# Patient Record
Sex: Male | Born: 1955
Health system: Southern US, Community
[De-identification: ages and names within clinical notes are randomized; demographics above are authoritative.]

## PROBLEM LIST (undated history)

## (undated) DIAGNOSIS — J45909 Unspecified asthma, uncomplicated: Secondary | ICD-10-CM

## (undated) DIAGNOSIS — I10 Essential (primary) hypertension: Secondary | ICD-10-CM

## (undated) DIAGNOSIS — M1990 Unspecified osteoarthritis, unspecified site: Secondary | ICD-10-CM

## (undated) HISTORY — PX: TRACHEOSTOMY: SUR1362

## (undated) HISTORY — DX: Unspecified asthma, uncomplicated: J45.909

## (undated) HISTORY — DX: Essential (primary) hypertension: I10

## (undated) HISTORY — PX: JOINT REPLACEMENT: SHX530

---

## 2011-01-04 ENCOUNTER — Ambulatory Visit: Payer: Self-pay | Admitting: Family Medicine

## 2011-09-10 DIAGNOSIS — Z96649 Presence of unspecified artificial hip joint: Secondary | ICD-10-CM | POA: Insufficient documentation

## 2012-03-16 ENCOUNTER — Ambulatory Visit: Payer: Self-pay | Admitting: Gastroenterology

## 2012-03-19 LAB — HM COLONOSCOPY

## 2014-09-28 LAB — PSA

## 2015-04-04 ENCOUNTER — Encounter: Payer: Self-pay | Admitting: Unknown Physician Specialty

## 2015-04-04 ENCOUNTER — Ambulatory Visit (INDEPENDENT_AMBULATORY_CARE_PROVIDER_SITE_OTHER): Payer: 59 | Admitting: Unknown Physician Specialty

## 2015-04-04 VITALS — BP 126/85 | HR 76 | Temp 97.8°F | Ht 67.3 in | Wt 176.6 lb

## 2015-04-04 DIAGNOSIS — I10 Essential (primary) hypertension: Secondary | ICD-10-CM | POA: Diagnosis not present

## 2015-04-04 DIAGNOSIS — J4540 Moderate persistent asthma, uncomplicated: Secondary | ICD-10-CM

## 2015-04-04 DIAGNOSIS — J45909 Unspecified asthma, uncomplicated: Secondary | ICD-10-CM

## 2015-04-04 MED ORDER — VENTOLIN HFA 108 (90 BASE) MCG/ACT IN AERS: 2.0000 | INHALATION_SPRAY | Freq: Four times a day (QID) | RESPIRATORY_TRACT | Status: DC | PRN

## 2015-04-04 MED ORDER — LISINOPRIL 10 MG PO TABS: 10.0000 mg | ORAL_TABLET | Freq: Every day | ORAL | Status: DC

## 2015-04-04 MED ORDER — FLUTICASONE-SALMETEROL 250-50 MCG/DOSE IN AEPB: 1.0000 | INHALATION_SPRAY | Freq: Two times a day (BID) | RESPIRATORY_TRACT | Status: DC

## 2015-04-04 NOTE — Assessment & Plan Note (Signed)
Stable BP on Lisinopril.

## 2015-04-04 NOTE — Progress Notes (Signed)
   BP 126/85 mmHg  Pulse 76  Temp(Src) 97.8 F (36.6 C)  Ht 5' 7.3" (1.709 m)  Wt 176 lb 9.6 oz (80.105 kg)  BMI 27.43 kg/m2  SpO2 93%   Subjective:    Patient ID: David Quinn, male    DOB: 1956-01-02, 59 y.o.   MRN: [id number]  HPI: David Quinn is a 59 y.o. male  Chief Complaint  Patient presents with  . Hypertension    Hypertension: Patient denies headaches, chest pain or shortness of breath. He does not monitor BP at home. Compliant on Lisinopril.  Asthma: Reports increased use of Ventolin Inhaler, at least two times daily. Advair is take two times daily. Denies shortness of breath or recent asthma attacks. Reports chest tightness with relief from Ventolin Inhaler.   Relevant past medical, surgical, family and social history reviewed and updated as indicated. Interim medical history since our last visit reviewed. Allergies and medications reviewed and updated.  Review of Systems  Constitutional: Negative.   Respiratory: Negative.  Negative for cough, chest tightness, shortness of breath, wheezing and stridor.   Cardiovascular: Negative.  Negative for chest pain, palpitations and leg swelling.  Skin: Negative.  Negative for color change and pallor.  Neurological: Negative.  Negative for dizziness, light-headedness and headaches.    Per HPI unless specifically indicated above     Objective:    BP 126/85 mmHg  Pulse 76  Temp(Src) 97.8 F (36.6 C)  Ht 5' 7.3" (1.709 m)  Wt 176 lb 9.6 oz (80.105 kg)  BMI 27.43 kg/m2  SpO2 93%  Wt Readings from Last 3 Encounters:  04/04/15 176 lb 9.6 oz (80.105 kg)  09/28/14 173 lb (78.472 kg)    Physical Exam  Constitutional: He is oriented to person, place, and time. He appears well-developed and well-nourished. No distress.  HENT:  Head: Normocephalic.  Cardiovascular: Normal rate, regular rhythm and normal heart sounds.   Pulmonary/Chest: Effort normal and breath sounds normal. No respiratory distress. He has no  wheezes. He has no rales. He exhibits no tenderness.  Neurological: He is alert and oriented to person, place, and time.  Skin: Skin is warm and dry. He is not diaphoretic. No erythema. No pallor.      Assessment & Plan:   Problem List Items Addressed This Visit      Unprioritized   Hypertension    Stable BP on Lisinopril.      Relevant Medications   lisinopril (PRINIVIL,ZESTRIL) 10 MG tablet   Asthma, moderate persistent    Compliant with Advir and Ventolin. Increased ventolin use.      Relevant Medications   VENTOLIN HFA 108 (90 BASE) MCG/ACT inhaler   Fluticasone-Salmeterol (ADVAIR DISKUS) 250-50 MCG/DOSE AEPB    Other Visit Diagnoses    Moderate persistent asthma, uncomplicated    -  Primary    Relevant Medications    VENTOLIN HFA 108 (90 BASE) MCG/ACT inhaler    Fluticasone-Salmeterol (ADVAIR DISKUS) 250-50 MCG/DOSE AEPB       Increased Advair 250-50 secondary to increased Ventolin use.  Follow up plan: Return in about 6 months (around 10/05/2015) for physical.

## 2015-04-04 NOTE — Assessment & Plan Note (Signed)
Compliant with Advir and Ventolin. Increased ventolin use.

## 2015-10-06 ENCOUNTER — Encounter: Payer: Self-pay | Admitting: Unknown Physician Specialty

## 2015-10-06 ENCOUNTER — Ambulatory Visit (INDEPENDENT_AMBULATORY_CARE_PROVIDER_SITE_OTHER): Payer: 59 | Admitting: Unknown Physician Specialty

## 2015-10-06 VITALS — BP 119/79 | HR 96 | Temp 97.7°F | Ht 66.0 in | Wt 169.6 lb

## 2015-10-06 DIAGNOSIS — I10 Essential (primary) hypertension: Secondary | ICD-10-CM

## 2015-10-06 DIAGNOSIS — J4540 Moderate persistent asthma, uncomplicated: Secondary | ICD-10-CM

## 2015-10-06 DIAGNOSIS — Z0000 Encounter for general adult medical examination without abnormal findings: Secondary | ICD-10-CM

## 2015-10-06 LAB — MICROALBUMIN, URINE WAIVED
CREATININE, URINE WAIVED: 200 mg/dL (ref 10–300)
Microalb, Ur Waived: 30 mg/L — ABNORMAL HIGH (ref 0–19)

## 2015-10-06 NOTE — Progress Notes (Signed)
BP 119/79 mmHg  Pulse 96  Temp(Src) 97.7 F (36.5 C)  Ht  (1.676 m)  Wt 169 lb 9.6 oz (76.93 kg)  BMI 27.39 kg/m2  SpO2 94%   Subjective:    Patient ID: David Quinn, male    DOB: 05/25/1956, 60 y.o.   MRN: [id number]  HPI: David Quinn is a 60 y.o. male  Chief Complaint  Patient presents with  . Annual Exam   Hypertension Using medications without difficulty Average home BPsNot checking  No problems or lightheadedness No chest pain with exertion or shortness of breath No Edema  ASTHMA Triggers:change in weather Symptoms are well controlled: Using medications without problems: Night time symptoms: none Wheeze/SOB: rare ER visits since last visit:none Missed work or school: no    Relevant past medical, surgical, family and social history reviewed and updated as indicated. Interim medical history since our last visit reviewed. Allergies and medications reviewed and updated.  Review of Systems  Per HPI unless specifically indicated above     Objective:    BP 119/79 mmHg  Pulse 96  Temp(Src) 97.7 F (36.5 C)  Ht  (1.676 m)  Wt 169 lb 9.6 oz (76.93 kg)  BMI 27.39 kg/m2  SpO2 94%  Wt Readings from Last 3 Encounters:  10/06/15 169 lb 9.6 oz (76.93 kg)  04/04/15 176 lb 9.6 oz (80.105 kg)  09/28/14 173 lb (78.472 kg)    Physical Exam  Constitutional: He is oriented to person, place, and time. He appears well-developed and well-nourished.  HENT:  Head: Normocephalic.  Right Ear: Tympanic membrane, external ear and ear canal normal.  Left Ear: Tympanic membrane, external ear and ear canal normal.  Mouth/Throat: Uvula is midline, oropharynx is clear and moist and mucous membranes are normal.  Eyes: Pupils are equal, round, and reactive to light.  Cardiovascular: Normal rate, regular rhythm and normal heart sounds.  Exam reveals no gallop and no friction rub.   No murmur heard. Pulmonary/Chest: Effort normal and breath sounds normal.  No respiratory distress.  Abdominal: Soft. Bowel sounds are normal. He exhibits no distension. There is no tenderness.  Musculoskeletal: Normal range of motion.  Neurological: He is alert and oriented to person, place, and time. He has normal reflexes.  Skin: Skin is warm and dry.  Psychiatric: He has a normal mood and affect. His behavior is normal. Judgment and thought content normal.    Results for orders placed or performed in visit on 04/04/15  PSA  Result Value Ref Range   PSA from PP   HM COLONOSCOPY  Result Value Ref Range   HM Colonoscopy from [facility]       Assessment & Plan:   Problem List Items Addressed This Visit      Unprioritized   Hypertension    Stable, continue present medications.        Relevant Orders   Comprehensive metabolic panel   Lipid Panel w/o Chol/HDL Ratio   Uric acid   Microalbumin, Urine Waived   Asthma, moderate persistent    Other Visit Diagnoses    Routine general medical examination at a health care facility    -  Primary    Relevant Orders    Hepatitis C antibody    HIV antibody    TSH    PSA    Lipid Panel w/o Chol/HDL Ratio    CBC with Differential/Platelet        Follow up plan: Return in about 6 months (around  04/04/2016).       

## 2015-10-06 NOTE — Assessment & Plan Note (Signed)
Stable, continue present medications.   

## 2015-10-07 LAB — CBC WITH DIFFERENTIAL/PLATELET
Basophils Absolute: 0 10*3/uL (ref 0.0–0.2)
Basos: 1 %
EOS (ABSOLUTE): 0.6 10*3/uL — AB (ref 0.0–0.4)
EOS: 15 %
Hematocrit: 44.8 % (ref 37.5–51.0)
Hemoglobin: 14.4 g/dL (ref 12.6–17.7)
IMMATURE GRANULOCYTES: 0 %
Immature Grans (Abs): 0 10*3/uL (ref 0.0–0.1)
LYMPHS: 30 %
Lymphocytes Absolute: 1.2 10*3/uL (ref 0.7–3.1)
MCH: 26.6 pg (ref 26.6–33.0)
MCHC: 32.1 g/dL (ref 31.5–35.7)
MCV: 83 fL (ref 79–97)
MONOS ABS: 0.5 10*3/uL (ref 0.1–0.9)
Monocytes: 13 %
NEUTROS PCT: 41 %
Neutrophils Absolute: 1.7 10*3/uL (ref 1.4–7.0)
PLATELETS: 273 10*3/uL (ref 150–379)
RBC: 5.42 x10E6/uL (ref 4.14–5.80)
RDW: 13.8 % (ref 12.3–15.4)
WBC: 4 10*3/uL (ref 3.4–10.8)

## 2015-10-07 LAB — COMPREHENSIVE METABOLIC PANEL
ALBUMIN: 4 g/dL (ref 3.5–5.5)
ALK PHOS: 80 IU/L (ref 39–117)
ALT: 37 IU/L (ref 0–44)
AST: 34 IU/L (ref 0–40)
Albumin/Globulin Ratio: 1.3 (ref 1.1–2.5)
BILIRUBIN TOTAL: 0.3 mg/dL (ref 0.0–1.2)
BUN / CREAT RATIO: 18 (ref 9–20)
BUN: 19 mg/dL (ref 6–24)
CHLORIDE: 101 mmol/L (ref 96–106)
CO2: 23 mmol/L (ref 18–29)
Calcium: 9.2 mg/dL (ref 8.7–10.2)
Creatinine, Ser: 1.03 mg/dL (ref 0.76–1.27)
GFR calc Af Amer: 91 mL/min/{1.73_m2} (ref >59)
GFR calc non Af Amer: 79 mL/min/{1.73_m2} (ref >59)
Globulin, Total: 3 g/dL (ref 1.5–4.5)
Glucose: 86 mg/dL (ref 65–99)
Potassium: 4.5 mmol/L (ref 3.5–5.2)
SODIUM: 140 mmol/L (ref 134–144)
Total Protein: 7 g/dL (ref 6.0–8.5)

## 2015-10-07 LAB — TSH: TSH: 1.9 u[IU]/mL (ref 0.450–4.500)

## 2015-10-07 LAB — LIPID PANEL W/O CHOL/HDL RATIO
Cholesterol, Total: 227 mg/dL — ABNORMAL HIGH (ref 100–199)
HDL: 63 mg/dL (ref >39)
LDL Calculated: 150 mg/dL — ABNORMAL HIGH (ref 0–99)
Triglycerides: 72 mg/dL (ref 0–149)
VLDL CHOLESTEROL CAL: 14 mg/dL (ref 5–40)

## 2015-10-07 LAB — HEPATITIS C ANTIBODY

## 2015-10-07 LAB — PSA: Prostate Specific Ag, Serum: 1.8 ng/mL (ref 0.0–4.0)

## 2015-10-07 LAB — HIV ANTIBODY (ROUTINE TESTING W REFLEX): HIV Screen 4th Generation wRfx: NONREACTIVE

## 2015-10-07 LAB — URIC ACID: Uric Acid: 7.2 mg/dL (ref 3.7–8.6)

## 2015-10-09 ENCOUNTER — Encounter: Payer: Self-pay | Admitting: Unknown Physician Specialty

## 2015-11-24 ENCOUNTER — Encounter: Payer: Self-pay | Admitting: Unknown Physician Specialty

## 2015-11-24 ENCOUNTER — Ambulatory Visit (INDEPENDENT_AMBULATORY_CARE_PROVIDER_SITE_OTHER): Payer: 59 | Admitting: Unknown Physician Specialty

## 2015-11-24 VITALS — BP 138/86 | HR 96 | Temp 98.3°F | Ht 65.2 in | Wt 165.8 lb

## 2015-11-24 DIAGNOSIS — K047 Periapical abscess without sinus: Secondary | ICD-10-CM | POA: Diagnosis not present

## 2015-11-24 MED ORDER — AMOXICILLIN-POT CLAVULANATE 875-125 MG PO TABS: 1.0000 | ORAL_TABLET | Freq: Two times a day (BID) | ORAL | Status: DC

## 2015-11-24 NOTE — Progress Notes (Signed)
BP 138/86 mmHg  Pulse 96  Temp(Src) 98.3 F (36.8 C)  Ht 5' 5.2" (1.656 m)  Wt 165 lb 12.8 oz (75.206 kg)  BMI 27.42 kg/m2  SpO2 98%   Subjective:    Patient ID: David Quinn, male    DOB: [birth year], 60 y.o.   MRN: [id number]  HPI: David Quinn is a 60 y.o. male  Chief Complaint  Patient presents with  . Facial Swelling    pt states he has had some swelling on his face in left cheek area since Saturday. States he thinks it is a wisdom tooth and has been putting ice on it.    Pt with left facial swelling.  It is better and no longer painful.  States he doesn't eat on that side as he thinks he has an impacted wisdom tooth.    Relevant past medical, surgical, family and social history reviewed and updated as indicated. Interim medical history since our last visit reviewed. Allergies and medications reviewed and updated.  Review of Systems  Per HPI unless specifically indicated above     Objective:    BP 138/86 mmHg  Pulse 96  Temp(Src) 98.3 F (36.8 C)  Ht 5' 5.2" (1.656 m)  Wt 165 lb 12.8 oz (75.206 kg)  BMI 27.42 kg/m2  SpO2 98%  Wt Readings from Last 3 Encounters:  11/24/15 165 lb 12.8 oz (75.206 kg)  10/06/15 169 lb 9.6 oz (76.93 kg)  04/04/15 176 lb 9.6 oz (80.105 kg)    Physical Exam  Constitutional: He is oriented to person, place, and time. He appears well-developed and well-nourished. No distress.  HENT:  Head: Normocephalic and atraumatic.  Poor dentitions and gum swelling  Eyes: Conjunctivae and lids are normal. Right eye exhibits no discharge. Left eye exhibits no discharge. No scleral icterus.  Cardiovascular: Normal rate.   Pulmonary/Chest: Effort normal.  Abdominal: Normal appearance. There is no splenomegaly or hepatomegaly.  Musculoskeletal: Normal range of motion.  Neurological: He is alert and oriented to person, place, and time.  Skin: Skin is intact. No rash noted. No pallor.  Psychiatric: He has a normal mood and affect. His  behavior is normal. Judgment and thought content normal.    Results for orders placed or performed in visit on 10/06/15  Comprehensive metabolic panel  Result Value Ref Range   Glucose 86 65 - 99 mg/dL   BUN 19 6 - 24 mg/dL   Creatinine, Ser 7.82 0.76 - 1.27 mg/dL   GFR calc non Af Amer 79 >59 mL/min/1.73   GFR calc Af Amer 91 >59 mL/min/1.73   BUN/Creatinine Ratio 18 9 - 20   Sodium 140 134 - 144 mmol/L   Potassium 4.5 3.5 - 5.2 mmol/L   Chloride 101 96 - 106 mmol/L   CO2 23 18 - 29 mmol/L   Calcium 9.2 8.7 - 10.2 mg/dL   Total Protein 7.0 6.0 - 8.5 g/dL   Albumin 4.0 3.5 - 5.5 g/dL   Globulin, Total 3.0 1.5 - 4.5 g/dL   Albumin/Globulin Ratio 1.3 1.1 - 2.5   Bilirubin Total 0.3 0.0 - 1.2 mg/dL   Alkaline Phosphatase 80 39 - 117 IU/L   AST 34 0 - 40 IU/L   ALT 37 0 - 44 IU/L  Hepatitis C antibody  Result Value Ref Range   Hep C Virus Ab <0.1 0.0 - 0.9 s/co ratio  HIV antibody  Result Value Ref Range   HIV Screen 4th Generation wRfx Non Reactive Non  Reactive  TSH  Result Value Ref Range   TSH 1.900 0.450 - 4.500 uIU/mL  PSA  Result Value Ref Range   Prostate Specific Ag, Serum 1.8 0.0 - 4.0 ng/mL  Lipid Panel w/o Chol/HDL Ratio  Result Value Ref Range   Cholesterol, Total 227 (H) 100 - 199 mg/dL   Triglycerides 72 0 - 149 mg/dL   HDL 63 >16>39 mg/dL   VLDL Cholesterol Cal 14 5 - 40 mg/dL   LDL Calculated 109150 (H) 0 - 99 mg/dL  CBC with Differential/Platelet  Result Value Ref Range   WBC 4.0 3.4 - 10.8 x10E3/uL   RBC 5.42 4.14 - 5.80 x10E6/uL   Hemoglobin 14.4 12.6 - 17.7 g/dL   Hematocrit 60.444.8 54.037.5 - 51.0 %   MCV 83 79 - 97 fL   MCH 26.6 26.6 - 33.0 pg   MCHC 32.1 31.5 - 35.7 g/dL   RDW 98.113.8 19.112.3 - 47.815.4 %   Platelets 273 150 - 379 x10E3/uL   Neutrophils 41 %   Lymphs 30 %   Monocytes 13 %   Eos 15 %   Basos 1 %   Neutrophils Absolute 1.7 1.4 - 7.0 x10E3/uL   Lymphocytes Absolute 1.2 0.7 - 3.1 x10E3/uL   Monocytes Absolute 0.5 0.1 - 0.9 x10E3/uL   EOS  (ABSOLUTE) 0.6 (H) 0.0 - 0.4 x10E3/uL   Basophils Absolute 0.0 0.0 - 0.2 x10E3/uL   Immature Granulocytes 0 %   Immature Grans (Abs) 0.0 0.0 - 0.1 x10E3/uL  Uric acid  Result Value Ref Range   Uric Acid 7.2 3.7 - 8.6 mg/dL  Microalbumin, Urine Waived  Result Value Ref Range   Microalb, Ur Waived 30 (H) 0 - 19 mg/L   Creatinine, Urine Waived 200 10 - 300 mg/dL   Microalb/Creat Ratio <30 <30 mg/g      Assessment & Plan:   Problem List Items Addressed This Visit    None    Visit Diagnoses    Dental abscess    -  Primary        Follow up plan: Return for with dentist.

## 2015-11-24 NOTE — Patient Instructions (Signed)
David RamsKaren Quinn [phone]

## 2016-01-23 ENCOUNTER — Other Ambulatory Visit: Payer: Self-pay | Admitting: Unknown Physician Specialty

## 2016-02-06 ENCOUNTER — Other Ambulatory Visit: Payer: Self-pay | Admitting: Unknown Physician Specialty

## 2016-04-05 ENCOUNTER — Ambulatory Visit: Payer: 59 | Admitting: Unknown Physician Specialty

## 2016-04-09 ENCOUNTER — Other Ambulatory Visit: Payer: Self-pay | Admitting: Unknown Physician Specialty

## 2016-04-12 ENCOUNTER — Ambulatory Visit: Payer: 59 | Admitting: Unknown Physician Specialty

## 2016-04-17 ENCOUNTER — Ambulatory Visit: Payer: 59 | Admitting: Unknown Physician Specialty

## 2016-04-23 ENCOUNTER — Ambulatory Visit (INDEPENDENT_AMBULATORY_CARE_PROVIDER_SITE_OTHER): Payer: 59 | Admitting: Unknown Physician Specialty

## 2016-04-23 ENCOUNTER — Encounter: Payer: Self-pay | Admitting: Unknown Physician Specialty

## 2016-04-23 DIAGNOSIS — I10 Essential (primary) hypertension: Secondary | ICD-10-CM

## 2016-04-23 DIAGNOSIS — J4540 Moderate persistent asthma, uncomplicated: Secondary | ICD-10-CM

## 2016-04-23 DIAGNOSIS — E785 Hyperlipidemia, unspecified: Secondary | ICD-10-CM | POA: Diagnosis not present

## 2016-04-23 NOTE — Assessment & Plan Note (Signed)
Reviewed ASCVD calculator.  Statin recommended.  Pt refused

## 2016-04-23 NOTE — Patient Instructions (Addendum)

## 2016-04-23 NOTE — Assessment & Plan Note (Signed)
Stable, continue present medications.   

## 2016-04-23 NOTE — Progress Notes (Signed)
BP 135/89 (BP Location: Left Arm, Patient Position: Sitting, Cuff Size: Large)   Pulse 68   Temp 98 F (36.7 C)   Ht 5\' 7"  (1.702 m)   Wt 174 lb (78.9 kg)   SpO2 98%   BMI 27.25 kg/m    Subjective:    Patient ID: David Quinn, male    DOB: 11/07/1955, 60 y.o.   MRN: [id number]  HPI: David MessierDaniel L Mcpeters is a 60 y.o. male  Chief Complaint  Patient presents with  . Asthma  . Hypertension   Hypertension Using medications without difficulty Average home BPsNot checking                   No problems or lightheadedness No chest pain with exertion or shortness of breath No Edema  ASTHMA Triggers:change in weather Symptoms are well controlled: Using medications without problems: Night time symptoms: none Wheeze/SOB: rare ER visits since last visit:none Missed work or school: no  Relevant past medical, surgical, family and social history reviewed and updated as indicated. Interim medical history since our last visit reviewed. Allergies and medications reviewed and updated.  Review of Systems  Per HPI unless specifically indicated above     Objective:    BP 135/89 (BP Location: Left Arm, Patient Position: Sitting, Cuff Size: Large)   Pulse 68   Temp 98 F (36.7 C)   Ht 5\' 7"  (1.702 m)   Wt 174 lb (78.9 kg)   SpO2 98%   BMI 27.25 kg/m   Wt Readings from Last 3 Encounters:  04/23/16 174 lb (78.9 kg)  11/24/15 165 lb 12.8 oz (75.2 kg)  10/06/15 169 lb 9.6 oz (76.9 kg)    Physical Exam  Constitutional: He is oriented to person, place, and time. He appears well-developed and well-nourished. No distress.  HENT:  Head: Normocephalic and atraumatic.  Eyes: Conjunctivae and lids are normal. Right eye exhibits no discharge. Left eye exhibits no discharge. No scleral icterus.  Neck: Normal range of motion. Neck supple. No JVD present. Carotid bruit is not present.  Cardiovascular: Normal rate, regular rhythm and normal heart sounds.   Pulmonary/Chest: Effort  normal and breath sounds normal. No respiratory distress.  Abdominal: Normal appearance. There is no splenomegaly or hepatomegaly.  Musculoskeletal: Normal range of motion.  Neurological: He is alert and oriented to person, place, and time.  Skin: Skin is warm, dry and intact. No rash noted. No pallor.  Psychiatric: He has a normal mood and affect. His behavior is normal. Judgment and thought content normal.    Results for orders placed or performed in visit on 10/06/15  Comprehensive metabolic panel  Result Value Ref Range   Glucose 86 65 - 99 mg/dL   BUN 19 6 - 24 mg/dL   Creatinine, Ser 4.091.03 0.76 - 1.27 mg/dL   GFR calc non Af Amer 79 >59 mL/min/1.73   GFR calc Af Amer 91 >59 mL/min/1.73   BUN/Creatinine Ratio 18 9 - 20   Sodium 140 134 - 144 mmol/L   Potassium 4.5 3.5 - 5.2 mmol/L   Chloride 101 96 - 106 mmol/L   CO2 23 18 - 29 mmol/L   Calcium 9.2 8.7 - 10.2 mg/dL   Total Protein 7.0 6.0 - 8.5 g/dL   Albumin 4.0 3.5 - 5.5 g/dL   Globulin, Total 3.0 1.5 - 4.5 g/dL   Albumin/Globulin Ratio 1.3 1.1 - 2.5   Bilirubin Total 0.3 0.0 - 1.2 mg/dL   Alkaline Phosphatase 80 39 -  117 IU/L   AST 34 0 - 40 IU/L   ALT 37 0 - 44 IU/L  Hepatitis C antibody  Result Value Ref Range   Hep C Virus Ab <0.1 0.0 - 0.9 s/co ratio  HIV antibody  Result Value Ref Range   HIV Screen 4th Generation wRfx Non Reactive Non Reactive  TSH  Result Value Ref Range   TSH 1.900 0.450 - 4.500 uIU/mL  PSA  Result Value Ref Range   Prostate Specific Ag, Serum 1.8 0.0 - 4.0 ng/mL  Lipid Panel w/o Chol/HDL Ratio  Result Value Ref Range   Cholesterol, Total 227 (H) 100 - 199 mg/dL   Triglycerides 72 0 - 149 mg/dL   HDL 63 >16 mg/dL   VLDL Cholesterol Cal 14 5 - 40 mg/dL   LDL Calculated 109 (H) 0 - 99 mg/dL  CBC with Differential/Platelet  Result Value Ref Range   WBC 4.0 3.4 - 10.8 x10E3/uL   RBC 5.42 4.14 - 5.80 x10E6/uL   Hemoglobin 14.4 12.6 - 17.7 g/dL   Hematocrit 60.4 54.0 - 51.0 %   MCV 83  79 - 97 fL   MCH 26.6 26.6 - 33.0 pg   MCHC 32.1 31.5 - 35.7 g/dL   RDW 98.1 19.1 - 47.8 %   Platelets 273 150 - 379 x10E3/uL   Neutrophils 41 %   Lymphs 30 %   Monocytes 13 %   Eos 15 %   Basos 1 %   Neutrophils Absolute 1.7 1.4 - 7.0 x10E3/uL   Lymphocytes Absolute 1.2 0.7 - 3.1 x10E3/uL   Monocytes Absolute 0.5 0.1 - 0.9 x10E3/uL   EOS (ABSOLUTE) 0.6 (H) 0.0 - 0.4 x10E3/uL   Basophils Absolute 0.0 0.0 - 0.2 x10E3/uL   Immature Granulocytes 0 %   Immature Grans (Abs) 0.0 0.0 - 0.1 x10E3/uL  Uric acid  Result Value Ref Range   Uric Acid 7.2 3.7 - 8.6 mg/dL  Microalbumin, Urine Waived  Result Value Ref Range   Microalb, Ur Waived 30 (H) 0 - 19 mg/L   Creatinine, Urine Waived 200 10 - 300 mg/dL   Microalb/Creat Ratio <30 <30 mg/g      Assessment & Plan:   Problem List Items Addressed This Visit      Unprioritized   Asthma, moderate persistent    Stable, continue present medications.         Hyperlipidemia    Reviewed ASCVD calculator.  Statin recommended.  Pt refused      Hypertension    Stable, continue present medications.         Other Visit Diagnoses   None.      Follow up plan: Return in about 6 months (around 10/23/2016) for physical.

## 2016-10-23 ENCOUNTER — Ambulatory Visit (INDEPENDENT_AMBULATORY_CARE_PROVIDER_SITE_OTHER): Payer: 59 | Admitting: Unknown Physician Specialty

## 2016-10-23 ENCOUNTER — Encounter: Payer: Self-pay | Admitting: Unknown Physician Specialty

## 2016-10-23 VITALS — BP 137/83 | HR 85 | Temp 98.4°F | Ht 65.6 in | Wt 170.1 lb

## 2016-10-23 DIAGNOSIS — J4540 Moderate persistent asthma, uncomplicated: Secondary | ICD-10-CM

## 2016-10-23 DIAGNOSIS — I10 Essential (primary) hypertension: Secondary | ICD-10-CM | POA: Diagnosis not present

## 2016-10-23 DIAGNOSIS — Z0000 Encounter for general adult medical examination without abnormal findings: Secondary | ICD-10-CM

## 2016-10-23 MED ORDER — LISINOPRIL 10 MG PO TABS: 10.0000 mg | ORAL_TABLET | Freq: Every day | ORAL | 2 refills | Status: DC

## 2016-10-23 MED ORDER — FLUTICASONE-SALMETEROL 250-50 MCG/DOSE IN AEPB: 2.0000 | INHALATION_SPRAY | Freq: Every day | RESPIRATORY_TRACT | 12 refills | Status: DC

## 2016-10-23 NOTE — Assessment & Plan Note (Signed)
Stable, continue present medications.   

## 2016-10-23 NOTE — Progress Notes (Signed)
BP 137/83 (BP Location: Left Arm, Patient Position: Sitting, Cuff Size: Large)   Pulse 85   Temp 98.4 F (36.9 C)   Ht 5' 5.6" (1.666 m)   Wt 170 lb 1.6 oz (77.2 kg)   SpO2 93%   BMI 27.79 kg/m    Subjective:    Patient ID: David Quinn, male    DOB: 05/11/1956, [age] y.o.   MRN: [id number]  HPI: David Quinn is a 61 y.o. male  Chief Complaint  Patient presents with  . Annual Exam   Hypertension Using medications without difficulty Average home BPs   No problems or lightheadedness No chest pain with exertion or shortness of breath No Edema  ASTHMA Symptoms are well controlled Using medications without problems Night time symptoms: none Wheeze/SOB:none ER visits since last visit:none Missed work or school:none  Social History   Social History  . Marital status: Married    Spouse name: N/A  . Number of children: N/A  . Years of education: N/A   Occupational History  . Not on file.   Social History Main Topics  . Smoking status: Never Smoker  . Smokeless tobacco: Never Used  . Alcohol use No  . Drug use: No  . Sexual activity: Yes   Other Topics Concern  . Not on file   Social History Narrative  . No narrative on file   Family History  Problem Relation Age of Onset  . Asthma Mother   . Cancer Mother   . Hyperlipidemia Mother   . Hypertension Mother   . Hypertension Father   . Stroke Father   . Cancer Brother     stomach  . Seizures Brother    Past Medical History:  Diagnosis Date  . Asthma   . Hypertension    Past Surgical History:  Procedure Laterality Date  . JOINT REPLACEMENT Bilateral    hip replacement  . TRACHEOSTOMY      Relevant past medical, surgical, family and social history reviewed and updated as indicated. Interim medical history since our last visit reviewed. Allergies and medications reviewed and updated.  Review of Systems  Per HPI unless specifically indicated above     Objective:    BP 137/83 (BP  Location: Left Arm, Patient Position: Sitting, Cuff Size: Large)   Pulse 85   Temp 98.4 F (36.9 C)   Ht 5' 5.6" (1.666 m)   Wt 170 lb 1.6 oz (77.2 kg)   SpO2 93%   BMI 27.79 kg/m   Wt Readings from Last 3 Encounters:  10/23/16 170 lb 1.6 oz (77.2 kg)  04/23/16 174 lb (78.9 kg)  11/24/15 165 lb 12.8 oz (75.2 kg)    Physical Exam  Constitutional: He is oriented to person, place, and time. He appears well-developed and well-nourished.  HENT:  Head: Normocephalic.  Right Ear: Tympanic membrane, external ear and ear canal normal.  Left Ear: Tympanic membrane, external ear and ear canal normal.  Mouth/Throat: Uvula is midline, oropharynx is clear and moist and mucous membranes are normal.  Eyes: Pupils are equal, round, and reactive to light.  Cardiovascular: Normal rate, regular rhythm and normal heart sounds.  Exam reveals no gallop and no friction rub.   No murmur heard. Pulmonary/Chest: Effort normal and breath sounds normal. No respiratory distress.  Abdominal: Soft. Bowel sounds are normal. He exhibits no distension. There is no tenderness.  Musculoskeletal: Normal range of motion.  Neurological: He is alert and oriented to person, place, and time. He  has normal reflexes.  Skin: Skin is warm and dry.  Psychiatric: He has a normal mood and affect. His behavior is normal. Judgment and thought content normal.  n,,,, Assessment & Plan:   Problem List Items Addressed This Visit      Unprioritized   Asthma, moderate persistent    Stable, continue present medications.        Relevant Medications   Fluticasone-Salmeterol (ADVAIR DISKUS) 250-50 MCG/DOSE AEPB   Hypertension    Stable, continue present medications.        Relevant Medications   lisinopril (PRINIVIL,ZESTRIL) 10 MG tablet   Other Relevant Orders   Comprehensive metabolic panel   Lipid Panel w/o Chol/HDL Ratio    Other Visit Diagnoses    Annual physical exam    -  Primary   Relevant Orders   CBC with  Differential/Platelet   Comprehensive metabolic panel   Lipid Panel w/o Chol/HDL Ratio   PSA   TSH       Follow up plan: Return in about 6 months (around 04/22/2017).

## 2016-10-24 LAB — PSA: Prostate Specific Ag, Serum: 2 ng/mL (ref 0.0–4.0)

## 2016-10-24 LAB — COMPREHENSIVE METABOLIC PANEL
ALBUMIN: 4.3 g/dL (ref 3.6–4.8)
ALK PHOS: 83 IU/L (ref 39–117)
ALT: 37 IU/L (ref 0–44)
AST: 36 IU/L (ref 0–40)
Albumin/Globulin Ratio: 1.3 (ref 1.2–2.2)
BILIRUBIN TOTAL: 0.4 mg/dL (ref 0.0–1.2)
BUN / CREAT RATIO: 18 (ref 10–24)
BUN: 19 mg/dL (ref 8–27)
CHLORIDE: 97 mmol/L (ref 96–106)
CO2: 22 mmol/L (ref 18–29)
Calcium: 9.5 mg/dL (ref 8.6–10.2)
Creatinine, Ser: 1.04 mg/dL (ref 0.76–1.27)
GFR calc non Af Amer: 78 mL/min/{1.73_m2} (ref >59)
GFR, EST AFRICAN AMERICAN: 90 mL/min/{1.73_m2} (ref >59)
GLUCOSE: 82 mg/dL (ref 65–99)
Globulin, Total: 3.2 g/dL (ref 1.5–4.5)
POTASSIUM: 4.9 mmol/L (ref 3.5–5.2)
Sodium: 136 mmol/L (ref 134–144)
TOTAL PROTEIN: 7.5 g/dL (ref 6.0–8.5)

## 2016-10-24 LAB — CBC WITH DIFFERENTIAL/PLATELET
BASOS ABS: 0 10*3/uL (ref 0.0–0.2)
Basos: 0 %
EOS (ABSOLUTE): 0.3 10*3/uL (ref 0.0–0.4)
Eos: 7 %
Hematocrit: 43.6 % (ref 37.5–51.0)
Hemoglobin: 14.4 g/dL (ref 13.0–17.7)
IMMATURE GRANS (ABS): 0 10*3/uL (ref 0.0–0.1)
Immature Granulocytes: 1 %
LYMPHS ABS: 1.3 10*3/uL (ref 0.7–3.1)
LYMPHS: 31 %
MCH: 26.6 pg (ref 26.6–33.0)
MCHC: 33 g/dL (ref 31.5–35.7)
MCV: 80 fL (ref 79–97)
Monocytes Absolute: 0.5 10*3/uL (ref 0.1–0.9)
Monocytes: 11 %
NEUTROS ABS: 2.2 10*3/uL (ref 1.4–7.0)
Neutrophils: 50 %
PLATELETS: 337 10*3/uL (ref 150–379)
RBC: 5.42 x10E6/uL (ref 4.14–5.80)
RDW: 14 % (ref 12.3–15.4)
WBC: 4.3 10*3/uL (ref 3.4–10.8)

## 2016-10-24 LAB — LIPID PANEL W/O CHOL/HDL RATIO
Cholesterol, Total: 242 mg/dL — ABNORMAL HIGH (ref 100–199)
HDL: 66 mg/dL (ref >39)
LDL Calculated: 164 mg/dL — ABNORMAL HIGH (ref 0–99)
Triglycerides: 62 mg/dL (ref 0–149)
VLDL Cholesterol Cal: 12 mg/dL (ref 5–40)

## 2016-10-24 LAB — TSH: TSH: 2.67 u[IU]/mL (ref 0.450–4.500)

## 2016-10-28 ENCOUNTER — Encounter: Payer: Self-pay | Admitting: Unknown Physician Specialty

## 2017-04-22 ENCOUNTER — Ambulatory Visit (INDEPENDENT_AMBULATORY_CARE_PROVIDER_SITE_OTHER): Payer: BLUE CROSS/BLUE SHIELD | Admitting: Unknown Physician Specialty

## 2017-04-22 ENCOUNTER — Encounter: Payer: Self-pay | Admitting: Unknown Physician Specialty

## 2017-04-22 DIAGNOSIS — I10 Essential (primary) hypertension: Secondary | ICD-10-CM

## 2017-04-22 DIAGNOSIS — J4540 Moderate persistent asthma, uncomplicated: Secondary | ICD-10-CM

## 2017-04-22 DIAGNOSIS — E7800 Pure hypercholesterolemia, unspecified: Secondary | ICD-10-CM | POA: Diagnosis not present

## 2017-04-22 MED ORDER — ASPIRIN 81 MG PO TABS: 81.0000 mg | ORAL_TABLET | Freq: Every day | ORAL | 12 refills | Status: DC

## 2017-04-22 NOTE — Assessment & Plan Note (Signed)
Stable, continue present medications.   

## 2017-04-22 NOTE — Progress Notes (Signed)
BP 125/85   Pulse 90   Temp 97.7 F (36.5 C)   Ht 5' 6.8" (1.697 m)   Wt 172 lb 9.6 oz (78.3 kg)   SpO2 98%   BMI 27.20 kg/m    Subjective:    Patient ID: David Quinn, male    DOB: 08-06-1956, 61 y.o.   MRN: [id number]  HPI: David Quinn is a 61 y.o. male  Chief Complaint  Patient presents with  . Asthma    6 month f/up  . Hypertension    6 month f/up    Hypertension Using medications without difficulty Average home BPs Not checking   No problems or lightheadedness No chest pain with exertion or shortness of breath No Edema  Hyperlipidemia Last LDL is 164 The 10-year ASCVD risk score Denman George DC Jr., et al., 2013) is: 12.5%   Values used to calculate the score:     Age: 31 years     Sex: Male     Is Non-Hispanic African American: Yes     Diabetic: No     Tobacco smoker: No     Systolic Blood Pressure: 125 mmHg     Is BP treated: Yes     HDL Cholesterol: 66 mg/dL     Total Cholesterol: 242 mg/dL   ASTHMA Uses Advair.  Rarely uses rescue inhaler about once a mongh Symptoms are well controlled: yes Using medications without problems:yes Night time symptoms:no Wheeze/SOB:no ER visits since last visit:none Missed work or school:no     Relevant past medical, surgical, family and social history reviewed and updated as indicated. Interim medical history since our last visit reviewed. Allergies and medications reviewed and updated.  Review of Systems  Per HPI unless specifically indicated above     Objective:    BP 125/85   Pulse 90   Temp 97.7 F (36.5 C)   Ht 5' 6.8" (1.697 m)   Wt 172 lb 9.6 oz (78.3 kg)   SpO2 98%   BMI 27.20 kg/m   Wt Readings from Last 3 Encounters:  04/22/17 172 lb 9.6 oz (78.3 kg)  10/23/16 170 lb 1.6 oz (77.2 kg)  04/23/16 174 lb (78.9 kg)    Physical Exam  Constitutional: He is oriented to person, place, and time. He appears well-developed and well-nourished. No distress.  HENT:  Head: Normocephalic and  atraumatic.  Eyes: Conjunctivae and lids are normal. Right eye exhibits no discharge. Left eye exhibits no discharge. No scleral icterus.  Neck: Normal range of motion. Neck supple. No JVD present. Carotid bruit is not present.  Cardiovascular: Normal rate, regular rhythm and normal heart sounds.   Pulmonary/Chest: Effort normal and breath sounds normal. No respiratory distress.  Abdominal: Normal appearance. There is no splenomegaly or hepatomegaly.  Musculoskeletal: Normal range of motion.  Neurological: He is alert and oriented to person, place, and time.  Skin: Skin is warm, dry and intact. No rash noted. No pallor.  Psychiatric: He has a normal mood and affect. His behavior is normal. Judgment and thought content normal.    Results for orders placed or performed in visit on 10/23/16  CBC with Differential/Platelet  Result Value Ref Range   WBC 4.3 3.4 - 10.8 x10E3/uL   RBC 5.42 4.14 - 5.80 x10E6/uL   Hemoglobin 14.4 13.0 - 17.7 g/dL   Hematocrit 40.9 81.1 - 51.0 %   MCV 80 79 - 97 fL   MCH 26.6 26.6 - 33.0 pg   MCHC 33.0 31.5 -  35.7 g/dL   RDW 54.6 50.3 - 54.6 %   Platelets 337 150 - 379 x10E3/uL   Neutrophils 50 Not Estab. %   Lymphs 31 Not Estab. %   Monocytes 11 Not Estab. %   Eos 7 Not Estab. %   Basos 0 Not Estab. %   Neutrophils Absolute 2.2 1.4 - 7.0 x10E3/uL   Lymphocytes Absolute 1.3 0.7 - 3.1 x10E3/uL   Monocytes Absolute 0.5 0.1 - 0.9 x10E3/uL   EOS (ABSOLUTE) 0.3 0.0 - 0.4 x10E3/uL   Basophils Absolute 0.0 0.0 - 0.2 x10E3/uL   Immature Granulocytes 1 Not Estab. %   Immature Grans (Abs) 0.0 0.0 - 0.1 x10E3/uL  Comprehensive metabolic panel  Result Value Ref Range   Glucose 82 65 - 99 mg/dL   BUN 19 8 - 27 mg/dL   Creatinine, Ser 5.68 0.76 - 1.27 mg/dL   GFR calc non Af Amer 78 >59 mL/min/1.73   GFR calc Af Amer 90 >59 mL/min/1.73   BUN/Creatinine Ratio 18 10 - 24   Sodium 136 134 - 144 mmol/L   Potassium 4.9 3.5 - 5.2 mmol/L   Chloride 97 96 - 106 mmol/L     CO2 22 18 - 29 mmol/L   Calcium 9.5 8.6 - 10.2 mg/dL   Total Protein 7.5 6.0 - 8.5 g/dL   Albumin 4.3 3.6 - 4.8 g/dL   Globulin, Total 3.2 1.5 - 4.5 g/dL   Albumin/Globulin Ratio 1.3 1.2 - 2.2   Bilirubin Total 0.4 0.0 - 1.2 mg/dL   Alkaline Phosphatase 83 39 - 117 IU/L   AST 36 0 - 40 IU/L   ALT 37 0 - 44 IU/L  Lipid Panel w/o Chol/HDL Ratio  Result Value Ref Range   Cholesterol, Total 242 (H) 100 - 199 mg/dL   Triglycerides 62 0 - 149 mg/dL   HDL 66 >12 mg/dL   VLDL Cholesterol Cal 12 5 - 40 mg/dL   LDL Calculated 751 (H) 0 - 99 mg/dL  PSA  Result Value Ref Range   Prostate Specific Ag, Serum 2.0 0.0 - 4.0 ng/mL  TSH  Result Value Ref Range   TSH 2.670 0.450 - 4.500 uIU/mL      Assessment & Plan:   Problem List Items Addressed This Visit      Unprioritized   Asthma, moderate persistent    Stable, continue present medications.        Hyperlipidemia    Reviewed ASCVD calculator.  Risk score 12.7%.  Recommended statin.  Pt refused.  Start ASA 81 mg/day      Relevant Medications   aspirin 81 MG tablet   Hypertension    Stable, continue present medications.        Relevant Medications   aspirin 81 MG tablet       Follow up plan: Return for physical and review ASCVD again.

## 2017-04-22 NOTE — Assessment & Plan Note (Signed)
Reviewed ASCVD calculator.  Risk score 12.7%.  Recommended statin.  Pt refused.  Start ASA 81 mg/day

## 2017-06-16 ENCOUNTER — Other Ambulatory Visit: Payer: Self-pay | Admitting: Unknown Physician Specialty

## 2017-06-16 ENCOUNTER — Other Ambulatory Visit: Payer: Self-pay | Admitting: *Deleted

## 2017-06-16 MED ORDER — ALBUTEROL SULFATE HFA 108 (90 BASE) MCG/ACT IN AERS: INHALATION_SPRAY | RESPIRATORY_TRACT | 2 refills | Status: DC

## 2017-10-08 ENCOUNTER — Encounter: Payer: Self-pay | Admitting: Unknown Physician Specialty

## 2017-10-08 ENCOUNTER — Ambulatory Visit (INDEPENDENT_AMBULATORY_CARE_PROVIDER_SITE_OTHER): Payer: BLUE CROSS/BLUE SHIELD | Admitting: Unknown Physician Specialty

## 2017-10-08 VITALS — BP 132/89 | HR 78 | Temp 98.5°F | Ht 66.0 in | Wt 167.2 lb

## 2017-10-08 DIAGNOSIS — Z0000 Encounter for general adult medical examination without abnormal findings: Secondary | ICD-10-CM

## 2017-10-08 DIAGNOSIS — Z23 Encounter for immunization: Secondary | ICD-10-CM | POA: Diagnosis not present

## 2017-10-08 DIAGNOSIS — Z0001 Encounter for general adult medical examination with abnormal findings: Secondary | ICD-10-CM | POA: Diagnosis not present

## 2017-10-08 DIAGNOSIS — J4540 Moderate persistent asthma, uncomplicated: Secondary | ICD-10-CM | POA: Diagnosis not present

## 2017-10-08 DIAGNOSIS — I10 Essential (primary) hypertension: Secondary | ICD-10-CM

## 2017-10-08 DIAGNOSIS — E7800 Pure hypercholesterolemia, unspecified: Secondary | ICD-10-CM | POA: Diagnosis not present

## 2017-10-08 MED ORDER — FLUTICASONE-SALMETEROL 250-50 MCG/DOSE IN AEPB: 2.0000 | INHALATION_SPRAY | Freq: Every day | RESPIRATORY_TRACT | 12 refills | Status: DC

## 2017-10-08 MED ORDER — ALBUTEROL SULFATE HFA 108 (90 BASE) MCG/ACT IN AERS
2.0000 | INHALATION_SPRAY | Freq: Four times a day (QID) | RESPIRATORY_TRACT | 12 refills | Status: DC | PRN
Start: 2017-10-08 — End: 2018-11-28

## 2017-10-08 MED ORDER — LISINOPRIL 10 MG PO TABS: 10.0000 mg | ORAL_TABLET | Freq: Every day | ORAL | 2 refills | Status: DC

## 2017-10-08 NOTE — Assessment & Plan Note (Signed)
Discussed ASCVD risk.  Pt would like to "think about" statin.  Encouraged ASA 81 mg daily

## 2017-10-08 NOTE — Assessment & Plan Note (Signed)
Stable, continue present medications.   

## 2017-10-08 NOTE — Patient Instructions (Signed)
Td Vaccine (Tetanus and Diphtheria): What You Need to Know 1. Why get vaccinated? Tetanus  and diphtheria are very serious diseases. They are rare in the [place] today, but people who do become infected often have severe complications. Td vaccine is used to protect adolescents and adults from both of these diseases. Both tetanus and diphtheria are infections caused by bacteria. Diphtheria spreads from person to person through coughing or sneezing. Tetanus-causing bacteria enter the body through cuts, scratches, or wounds. TETANUS (lockjaw) causes painful muscle tightening and stiffness, usually all over the body.  It can lead to tightening of muscles in the head and neck so you can't open your mouth, swallow, or sometimes even breathe. Tetanus kills about 1 out of every 10 people who are infected even after receiving the best medical care.  DIPHTHERIA can cause a thick coating to form in the back of the throat.  It can lead to breathing problems, paralysis, heart failure, and death.  Before vaccines, as many as 200,000 cases of diphtheria and hundreds of cases of tetanus were reported in the [place] each year. Since vaccination began, reports of cases for both diseases have dropped by about 99%. 2. Td vaccine Td vaccine can protect adolescents and adults from tetanus and diphtheria. Td is usually given as a booster dose every 10 years but it can also be given earlier after a severe and dirty wound or burn. Another vaccine, called Tdap, which protects against pertussis in addition to tetanus and diphtheria, is sometimes recommended instead of Td vaccine. Your doctor or the person giving you the vaccine can give you more information. Td may safely be given at the same time as other vaccines. 3. Some people should not get this vaccine  A person who has ever had a life-threatening allergic reaction after a previous dose of any tetanus or diphtheria containing vaccine, OR has a severe  allergy to any part of this vaccine, should not get Td vaccine. Tell the person giving the vaccine about any severe allergies.  Talk to your doctor if you: ? had severe pain or swelling after any vaccine containing diphtheria or tetanus, ? ever had a condition called Guillain Barre Syndrome (GBS), ? aren't feeling well on the day the shot is scheduled. 4. What are the risks from Td vaccine? With any medicine, including vaccines, there is a chance of side effects. These are usually mild and go away on their own. Serious reactions are also possible but are rare. Most people who get Td vaccine do not have any problems with it. Mild problems following Td vaccine: (Did not interfere with activities)  Pain where the shot was given (about 8 people in 10)  Redness or swelling where the shot was given (about 1 person in 4)  Mild fever (rare)  Headache (about 1 person in 4)  Tiredness (about 1 person in 4)  Moderate problems following Td vaccine: (Interfered with activities, but did not require medical attention)  Fever over 102F (rare)  Severe problems following Td vaccine: (Unable to perform usual activities; required medical attention)  Swelling, severe pain, bleeding and/or redness in the arm where the shot was given (rare).  Problems that could happen after any vaccine:  People sometimes faint after a medical procedure, including vaccination. Sitting or lying down for about 15 minutes can help prevent fainting, and injuries caused by a fall. Tell your doctor if you feel dizzy, or have vision changes or ringing in the ears.  Some people get   severe pain in the shoulder and have difficulty moving the arm where a shot was given. This happens very rarely.  Any medication can cause a severe allergic reaction. Such reactions from a vaccine are very rare, estimated at fewer than 1 in a million doses, and would happen within a few minutes to a few hours after the vaccination. As with any  medicine, there is a very remote chance of a vaccine causing a serious injury or death. The safety of vaccines is always being monitored. For more information, visit: www.cdc.gov/vaccinesafety/ 5. What if there is a serious reaction? What should I look for? Look for anything that concerns you, such as signs of a severe allergic reaction, very high fever, or unusual behavior. Signs of a severe allergic reaction can include hives, swelling of the face and throat, difficulty breathing, a fast heartbeat, dizziness, and weakness. These would usually start a few minutes to a few hours after the vaccination. What should I do?  If you think it is a severe allergic reaction or other emergency that can't wait, call 9-1-1 or get the person to the nearest hospital. Otherwise, call your doctor.  Afterward, the reaction should be reported to the Vaccine Adverse Event Reporting System (VAERS). Your doctor might file this report, or you can do it yourself through the VAERS web site at www.vaers.hhs.gov, or by calling 1-800-822-7967. ? VAERS does not give medical advice. 6. The National Vaccine Injury Compensation Program The National Vaccine Injury Compensation Program (VICP) is a federal program that was created to compensate people who may have been injured by certain vaccines. Persons who believe they may have been injured by a vaccine can learn about the program and about filing a claim by calling 1-800-338-2382 or visiting the VICP website at www.hrsa.gov/vaccinecompensation. There is a time limit to file a claim for compensation. 7. How can I learn more?  Ask your doctor. He or she can give you the vaccine package insert or suggest other sources of information.  Call your local or state health department.  Contact the Centers for Disease Control and Prevention (CDC): ? Call 1-800-232-4636 (1-800-CDC-INFO) ? Visit CDC's website at www.cdc.gov/vaccines CDC Td Vaccine VIS (12/05/15) This information is  not intended to replace advice given to you by your health care provider. Make sure you discuss any questions you have with your health care provider. Document Released: 06/09/2006 Document Revised: 05/02/2016 Document Reviewed: 05/02/2016 Elsevier Interactive Patient Education  2017 [employer]  

## 2017-10-08 NOTE — Assessment & Plan Note (Signed)
Stable, continue present medications. Discussed goal of less than 130

## 2017-10-08 NOTE — Progress Notes (Signed)
BP 132/89 (BP Location: Left Arm, Patient Position: Sitting, Cuff Size: Normal)   Pulse 78   Temp 98.5 F (36.9 C) (Oral)   Ht 5\' 6"  (1.676 m)   Wt 167 lb 3.2 oz (75.8 kg)   SpO2 99%   BMI 26.99 kg/m    Subjective:    Patient ID: David Quinn, male    DOB: 03/01/1956, 62 y.o.   MRN: [id number]  HPI: David Quinn is a 62 y.o. male  Chief Complaint  Patient presents with  . Annual Exam   Hypertension Using medications without difficulty Average home BPs   No problems or lightheadedness No chest pain with exertion or shortness of breath No Edema  Hyperlipidemia Refusing cholesterol medications at this time Moves a lot of work States he cut down to one meal a day.    The 10-year ASCVD risk score Denman George(Goff DC Jr., et al., 2013) is: 14.3%   Values used to calculate the score:     Age: [age] years     Sex: Male     Is Non-Hispanic African American: Yes     Diabetic: No     Tobacco smoker: No     Systolic Blood Pressure: 132 mmHg     Is BP treated: Yes     HDL Cholesterol: 66 mg/dL     Total Cholesterol: 242 mg/dL  ASTHMA Symptoms are well controlled Using medications without problems: Night time symptoms: none Wheeze/SOB: none ER visits since last visit:none Missed work or school:no Rare use of Proair  Relevant past medical, surgical, family and social history reviewed and updated as indicated. Interim medical history since our last visit reviewed. Allergies and medications reviewed and updated.  Review of Systems  Constitutional: Negative.   HENT: Negative.   Eyes: Negative.   Respiratory: Negative.   Cardiovascular: Negative.   Gastrointestinal: Negative.   Endocrine: Negative.   Genitourinary: Negative.   Skin: Negative.   Allergic/Immunologic: Negative.   Neurological: Negative.   Hematological: Negative.   Psychiatric/Behavioral: Negative.     Per HPI unless specifically indicated above     Objective:    BP 132/89 (BP Location: Left  Arm, Patient Position: Sitting, Cuff Size: Normal)   Pulse 78   Temp 98.5 F (36.9 C) (Oral)   Ht 5\' 6"  (1.676 m)   Wt 167 lb 3.2 oz (75.8 kg)   SpO2 99%   BMI 26.99 kg/m   Wt Readings from Last 3 Encounters:  10/08/17 167 lb 3.2 oz (75.8 kg)  04/22/17 172 lb 9.6 oz (78.3 kg)  10/23/16 170 lb 1.6 oz (77.2 kg)    Physical Exam  Constitutional: He is oriented to person, place, and time. He appears well-developed and well-nourished.  HENT:  Head: Normocephalic.  Right Ear: Tympanic membrane, external ear and ear canal normal.  Left Ear: Tympanic membrane, external ear and ear canal normal.  Mouth/Throat: Uvula is midline, oropharynx is clear and moist and mucous membranes are normal.  Eyes: Pupils are equal, round, and reactive to light.  Cardiovascular: Normal rate, regular rhythm and normal heart sounds. Exam reveals no gallop and no friction rub.  No murmur heard. Pulmonary/Chest: Effort normal and breath sounds normal. No respiratory distress.  Abdominal: Soft. Bowel sounds are normal. He exhibits no distension. There is no tenderness.  Genitourinary: Prostate normal.  Musculoskeletal: Normal range of motion.  Neurological: He is alert and oriented to person, place, and time. He has normal reflexes.  Skin: Skin is warm and dry.  Psychiatric: He has a normal mood and affect. His behavior is normal. Judgment and thought content normal.    Results for orders placed or performed in visit on 10/23/16  CBC with Differential/Platelet  Result Value Ref Range   WBC 4.3 3.4 - 10.8 x10E3/uL   RBC 5.42 4.14 - 5.80 x10E6/uL   Hemoglobin 14.4 13.0 - 17.7 g/dL   Hematocrit 16.1 09.6 - 51.0 %   MCV 80 79 - 97 fL   MCH 26.6 26.6 - 33.0 pg   MCHC 33.0 31.5 - 35.7 g/dL   RDW 04.5 40.9 - 81.1 %   Platelets 337 150 - 379 x10E3/uL   Neutrophils 50 Not Estab. %   Lymphs 31 Not Estab. %   Monocytes 11 Not Estab. %   Eos 7 Not Estab. %   Basos 0 Not Estab. %   Neutrophils Absolute 2.2 1.4 -  7.0 x10E3/uL   Lymphocytes Absolute 1.3 0.7 - 3.1 x10E3/uL   Monocytes Absolute 0.5 0.1 - 0.9 x10E3/uL   EOS (ABSOLUTE) 0.3 0.0 - 0.4 x10E3/uL   Basophils Absolute 0.0 0.0 - 0.2 x10E3/uL   Immature Granulocytes 1 Not Estab. %   Immature Grans (Abs) 0.0 0.0 - 0.1 x10E3/uL  Comprehensive metabolic panel  Result Value Ref Range   Glucose 82 65 - 99 mg/dL   BUN 19 8 - 27 mg/dL   Creatinine, Ser 9.14 0.76 - 1.27 mg/dL   GFR calc non Af Amer 78 >59 mL/min/1.73   GFR calc Af Amer 90 >59 mL/min/1.73   BUN/Creatinine Ratio 18 10 - 24   Sodium 136 134 - 144 mmol/L   Potassium 4.9 3.5 - 5.2 mmol/L   Chloride 97 96 - 106 mmol/L   CO2 22 18 - 29 mmol/L   Calcium 9.5 8.6 - 10.2 mg/dL   Total Protein 7.5 6.0 - 8.5 g/dL   Albumin 4.3 3.6 - 4.8 g/dL   Globulin, Total 3.2 1.5 - 4.5 g/dL   Albumin/Globulin Ratio 1.3 1.2 - 2.2   Bilirubin Total 0.4 0.0 - 1.2 mg/dL   Alkaline Phosphatase 83 39 - 117 IU/L   AST 36 0 - 40 IU/L   ALT 37 0 - 44 IU/L  Lipid Panel w/o Chol/HDL Ratio  Result Value Ref Range   Cholesterol, Total 242 (H) 100 - 199 mg/dL   Triglycerides 62 0 - 149 mg/dL   HDL 66 >78 mg/dL   VLDL Cholesterol Cal 12 5 - 40 mg/dL   LDL Calculated 295 (H) 0 - 99 mg/dL  PSA  Result Value Ref Range   Prostate Specific Ag, Serum 2.0 0.0 - 4.0 ng/mL  TSH  Result Value Ref Range   TSH 2.670 0.450 - 4.500 uIU/mL      Assessment & Plan:   Problem List Items Addressed This Visit      Unprioritized   Asthma, moderate persistent    Stable, continue present medications.        Relevant Medications   albuterol (PROAIR HFA) 108 (90 Base) MCG/ACT inhaler   Fluticasone-Salmeterol (ADVAIR DISKUS) 250-50 MCG/DOSE AEPB   Hyperlipidemia    Discussed ASCVD risk.  Pt would like to "think about" statin.  Encouraged ASA 81 mg daily      Relevant Medications   lisinopril (PRINIVIL,ZESTRIL) 10 MG tablet   Hypertension    Stable, continue present medications. Discussed goal of less than 130        Relevant Medications   lisinopril (PRINIVIL,ZESTRIL) 10 MG tablet   Other  Relevant Orders   Comprehensive metabolic panel   Lipid Panel w/o Chol/HDL Ratio    Other Visit Diagnoses    Need for Td vaccine    -  Primary   Relevant Orders   Td vaccine greater than or equal to 7yo preservative free IM (Completed)   Annual physical exam       Relevant Orders   CBC with Differential/Platelet   PSA   TSH       Follow up plan: Recheck 6 months

## 2017-10-09 LAB — CBC WITH DIFFERENTIAL/PLATELET
BASOS ABS: 0 10*3/uL (ref 0.0–0.2)
Basos: 1 %
EOS (ABSOLUTE): 0.4 10*3/uL (ref 0.0–0.4)
EOS: 11 %
HEMATOCRIT: 40.4 % (ref 37.5–51.0)
Hemoglobin: 13.3 g/dL (ref 13.0–17.7)
Immature Grans (Abs): 0 10*3/uL (ref 0.0–0.1)
Immature Granulocytes: 0 %
LYMPHS ABS: 1.2 10*3/uL (ref 0.7–3.1)
Lymphs: 31 %
MCH: 26.3 pg — ABNORMAL LOW (ref 26.6–33.0)
MCHC: 32.9 g/dL (ref 31.5–35.7)
MCV: 80 fL (ref 79–97)
MONOS ABS: 0.5 10*3/uL (ref 0.1–0.9)
Monocytes: 14 %
Neutrophils Absolute: 1.6 10*3/uL (ref 1.4–7.0)
Neutrophils: 43 %
Platelets: 314 10*3/uL (ref 150–379)
RBC: 5.06 x10E6/uL (ref 4.14–5.80)
RDW: 14.2 % (ref 12.3–15.4)
WBC: 3.7 10*3/uL (ref 3.4–10.8)

## 2017-10-09 LAB — COMPREHENSIVE METABOLIC PANEL
ALK PHOS: 77 IU/L (ref 39–117)
ALT: 25 IU/L (ref 0–44)
AST: 26 IU/L (ref 0–40)
Albumin/Globulin Ratio: 1.3 (ref 1.2–2.2)
Albumin: 4.3 g/dL (ref 3.6–4.8)
BILIRUBIN TOTAL: 0.4 mg/dL (ref 0.0–1.2)
BUN/Creatinine Ratio: 15 (ref 10–24)
BUN: 16 mg/dL (ref 8–27)
CHLORIDE: 99 mmol/L (ref 96–106)
CO2: 25 mmol/L (ref 20–29)
CREATININE: 1.04 mg/dL (ref 0.76–1.27)
Calcium: 9.7 mg/dL (ref 8.6–10.2)
GFR calc Af Amer: 89 mL/min/{1.73_m2} (ref >59)
GFR calc non Af Amer: 77 mL/min/{1.73_m2} (ref >59)
GLOBULIN, TOTAL: 3.2 g/dL (ref 1.5–4.5)
Glucose: 82 mg/dL (ref 65–99)
POTASSIUM: 4.7 mmol/L (ref 3.5–5.2)
SODIUM: 139 mmol/L (ref 134–144)
Total Protein: 7.5 g/dL (ref 6.0–8.5)

## 2017-10-09 LAB — LIPID PANEL W/O CHOL/HDL RATIO
CHOLESTEROL TOTAL: 215 mg/dL — AB (ref 100–199)
HDL: 64 mg/dL (ref >39)
LDL Calculated: 138 mg/dL — ABNORMAL HIGH (ref 0–99)
TRIGLYCERIDES: 67 mg/dL (ref 0–149)
VLDL Cholesterol Cal: 13 mg/dL (ref 5–40)

## 2017-10-09 LAB — PSA: Prostate Specific Ag, Serum: 2.7 ng/mL (ref 0.0–4.0)

## 2017-10-09 LAB — TSH: TSH: 1.61 u[IU]/mL (ref 0.450–4.500)

## 2017-10-10 ENCOUNTER — Encounter: Payer: Self-pay | Admitting: Unknown Physician Specialty

## 2017-10-27 ENCOUNTER — Ambulatory Visit (INDEPENDENT_AMBULATORY_CARE_PROVIDER_SITE_OTHER): Payer: BLUE CROSS/BLUE SHIELD | Admitting: Unknown Physician Specialty

## 2017-10-27 ENCOUNTER — Encounter: Payer: Self-pay | Admitting: Unknown Physician Specialty

## 2017-10-27 VITALS — BP 127/92 | HR 111 | Temp 97.9°F | Ht 66.0 in | Wt 162.6 lb

## 2017-10-27 DIAGNOSIS — M542 Cervicalgia: Secondary | ICD-10-CM

## 2017-10-27 MED ORDER — CYCLOBENZAPRINE HCL 10 MG PO TABS: 10.0000 mg | ORAL_TABLET | Freq: Every day | ORAL | 0 refills | Status: DC

## 2017-10-27 MED ORDER — NAPROXEN 500 MG PO TABS: 500.0000 mg | ORAL_TABLET | Freq: Two times a day (BID) | ORAL | 0 refills | Status: DC

## 2017-10-27 NOTE — Progress Notes (Signed)
BP (!) 127/92   Pulse (!) 111   Temp 97.9 F (36.6 C) (Oral)   Ht 5\' 6"  (1.676 m)   Wt 162 lb 9.6 oz (73.8 kg)   SpO2 98%   BMI 26.24 kg/m    Subjective:    Patient ID: David Quinn, male    DOB: 02/13/1956, 62 y.o.   MRN: [id number]  HPI: David MessierDaniel L Tudor is a 62 y.o. male  Chief Complaint  Patient presents with  . Neck Pain    pt states he has been having neck pain for going on 4 weeks, left side is still hurting some   Neck Pain   This is a new problem. Episode onset: 10 days. The problem occurs constantly. The problem has been waxing and waning. The pain is associated with nothing. The pain is present in the left side (Mostly on left side but sometimes the whole neck or the back of the neck). The quality of the pain is described as aching. Exacerbated by: movement. The pain is worse during the night. Stiffness is present in the morning. Pertinent negatives include no chest pain, fever, headaches, leg pain, numbness, pain with swallowing, paresis, photophobia, syncope, tingling, trouble swallowing, visual change, weakness or weight loss. He has tried heat for the symptoms. The treatment provided moderate relief.   Relevant past medical, surgical, family and social history reviewed and updated as indicated. Interim medical history since our last visit reviewed. Allergies and medications reviewed and updated.  Review of Systems  Constitutional: Negative for fever and weight loss.  HENT: Negative for trouble swallowing.   Eyes: Negative for photophobia.  Cardiovascular: Negative for chest pain and syncope.  Musculoskeletal: Positive for neck pain.  Neurological: Negative for tingling, weakness, numbness and headaches.    Per HPI unless specifically indicated above     Objective:    BP (!) 127/92   Pulse (!) 111   Temp 97.9 F (36.6 C) (Oral)   Ht 5\' 6"  (1.676 m)   Wt 162 lb 9.6 oz (73.8 kg)   SpO2 98%   BMI 26.24 kg/m   Wt Readings from Last 3 Encounters:    10/27/17 162 lb 9.6 oz (73.8 kg)  10/08/17 167 lb 3.2 oz (75.8 kg)  04/22/17 172 lb 9.6 oz (78.3 kg)    Physical Exam  Constitutional: He is oriented to person, place, and time. He appears well-developed and well-nourished. No distress.  HENT:  Head: Normocephalic and atraumatic.  Eyes: Conjunctivae and lids are normal. Right eye exhibits no discharge. Left eye exhibits no discharge. No scleral icterus.  Neck: Normal range of motion. Neck supple. No JVD present. Carotid bruit is not present.  Cardiovascular: Normal rate, regular rhythm and normal heart sounds.  Pulmonary/Chest: Effort normal and breath sounds normal. No respiratory distress.  Abdominal: Normal appearance. There is no splenomegaly or hepatomegaly.  Musculoskeletal: Normal range of motion.  Neurological: He is alert and oriented to person, place, and time. He has normal strength.  Reflex Scores:      Tricep reflexes are 3+ on the right side.      Bicep reflexes are 3+ on the right side. Skin: Skin is warm, dry and intact. No rash noted. No pallor.  Psychiatric: He has a normal mood and affect. His behavior is normal. Judgment and thought content normal.       Assessment & Plan:   Problem List Items Addressed This Visit    None    Visit Diagnoses  Cervical pain (neck)    -  Primary   New problem.  Rx for Flexeril and Naprosyn.  Pt ed on sedation.  Exercises given.  Appt with Dr. Laural Benes for OMM       Follow up plan: Return for appt with Dr. Laural Benes for OMM.

## 2017-10-27 NOTE — Patient Instructions (Addendum)
Neck Exercises Neck exercises can be important for many reasons:  They can help you to improve and maintain flexibility in your neck. This can be especially important as you age.  They can help to make your neck stronger. This can make movement easier.  They can reduce or prevent neck pain.  They may help your upper back.  Ask your health care provider which neck exercises would be best for you. Exercises Neck Press Repeat this exercise 10 times. Do it first thing in the morning and right before bed or as told by your health care provider. 1. Lie on your back on a firm bed or on the floor with a pillow under your head. 2. Use your neck muscles to push your head down on the pillow and straighten your spine. 3. Hold the position as well as you can. Keep your head facing up and your chin tucked. 4. Slowly count to 5 while holding this position. 5. Relax for a few seconds. Then repeat.  Isometric Strengthening Do a full set of these exercises 2 times a day or as told by your health care provider. 1. Sit in a supportive chair and place your hand on your forehead. 2. Push forward with your head and neck while pushing back with your hand. Hold for 10 seconds. 3. Relax. Then repeat the exercise 3 times. 4. Next, do thesequence again, this time putting your hand against the back of your head. Use your head and neck to push backward against the hand pressure. 5. Finally, do the same exercise on either side of your head, pushing sideways against the pressure of your hand.  Prone Head Lifts Repeat this exercise 5 times. Do this 2 times a day or as told by your health care provider. 1. Lie face-down, resting on your elbows so that your chest and upper back are raised. 2. Start with your head facing downward, near your chest. Position your chin either on or near your chest. 3. Slowly lift your head upward. Lift until you are looking straight ahead. Then continue lifting your head as far back as  you can stretch. 4. Hold your head up for 5 seconds. Then slowly lower it to your starting position.  Supine Head Lifts Repeat this exercise 8-10 times. Do this 2 times a day or as told by your health care provider. 1. Lie on your back, bending your knees to point to the ceiling and keeping your feet flat on the floor. 2. Lift your head slowly off the floor, raising your chin toward your chest. 3. Hold for 5 seconds. 4. Relax and repeat.  Scapular Retraction Repeat this exercise 5 times. Do this 2 times a day or as told by your health care provider. 1. Stand with your arms at your sides. Look straight ahead. 2. Slowly pull both shoulders backward and downward until you feel a stretch between your shoulder blades in your upper back. 3. Hold for 10-30 seconds. 4. Relax and repeat.  Contact a health care provider if:  Your neck pain or discomfort gets much worse when you do an exercise.  Your neck pain or discomfort does not improve within 2 hours after you exercise. If you have any of these problems, stop exercising right away. Do not do the exercises again unless your health care provider says that you can. Get help right away if:  You develop sudden, severe neck pain. If this happens, stop exercising right away. Do not do the exercises again unless your   health care provider says that you can. Exercises Neck Stretch  Repeat this exercise 3-5 times. 1. Do this exercise while standing or while sitting in a chair. 2. Place your feet flat on the floor, shoulder-width apart. 3. Slowly turn your head to the right. Turn it all the way to the right so you can look over your right shoulder. Do not tilt or tip your head. 4. Hold this position for 10-30 seconds. 5. Slowly turn your head to the left, to look over your left shoulder. 6. Hold this position for 10-30 seconds.  Neck Retraction Repeat this exercise 8-10 times. Do this 3-4 times a day or as told by your health care  provider. 1. Do this exercise while standing or while sitting in a sturdy chair. 2. Look straight ahead. Do not bend your neck. 3. Use your fingers to push your chin backward. Do not bend your neck for this movement. Continue to face straight ahead. If you are doing the exercise properly, you will feel a slight sensation in your throat and a stretch at the back of your neck. 4. Hold the stretch for 1-2 seconds. Relax and repeat.  This information is not intended to replace advice given to you by your health care provider. Make sure you discuss any questions you have with your health care provider. Document Released: 07/24/2015 Document Revised: 01/18/2016 Document Reviewed: 02/20/2015 Elsevier Interactive Patient Education  2018 [employer]  Cervical Strain and Sprain Rehab Ask your health care provider which exercises are safe for you. Do exercises exactly as told by your health care provider and adjust them as directed. It is normal to feel mild stretching, pulling, tightness, or discomfort as you do these exercises, but you should stop right away if you feel sudden pain or your pain gets worse.Do not begin these exercises until told by your health care provider. Stretching and range of motion exercises These exercises warm up your muscles and joints and improve the movement and flexibility of your neck. These exercises also help to relieve pain, numbness, and tingling. Exercise A: Cervical side bend  1. Using good posture, sit on a stable chair or stand up. 2. Without moving your shoulders, slowly tilt your left / right ear to your shoulder until you feel a stretch in your neck muscles. You should be looking straight ahead. 3. Hold for __________ seconds. 4. Repeat with the other side of your neck. Repeat __________ times. Complete this exercise __________ times a day. Exercise B: Cervical rotation  1. Using good posture, sit on a stable chair or stand up. 2. Slowly turn your head to  the side as if you are looking over your left / right shoulder. ? Keep your eyes level with the ground. ? Stop when you feel a stretch along the side and the back of your neck. 3. Hold for __________ seconds. 4. Repeat this by turning to your other side. Repeat __________ times. Complete this exercise __________ times a day. Exercise C: Thoracic extension and pectoral stretch 1. Roll a towel or a small blanket so it is about 4 inches (10 cm) in diameter. 2. Lie down on your back on a firm surface. 3. Put the towel lengthwise, under your spine in the middle of your back. It should not be not under your shoulder blades. The towel should line up with your spine from your middle back to your lower back. 4. Put your hands behind your head and let your elbows fall out to your  sides. 5. Hold for __________ seconds. Repeat __________ times. Complete this exercise __________ times a day. Strengthening exercises These exercises build strength and endurance in your neck. Endurance is the ability to use your muscles for a long time, even after your muscles get tired. Exercise D: Upper cervical flexion, isometric 1. Lie on your back with a thin pillow behind your head and a small rolled-up towel under your neck. 2. Gently tuck your chin toward your chest and nod your head down to look toward your feet. Do not lift your head off the pillow. 3. Hold for __________ seconds. 4. Release the tension slowly. Relax your neck muscles completely before you repeat this exercise. Repeat __________ times. Complete this exercise __________ times a day. Exercise E: Cervical extension, isometric  1. Stand about 6 inches (15 cm) away from a wall, with your back facing the wall. 2. Place a soft object, about 6-8 inches (15-20 cm) in diameter, between the back of your head and the wall. A soft object could be a small pillow, a ball, or a folded towel. 3. Gently tilt your head back and press into the soft object. Keep your  jaw and forehead relaxed. 4. Hold for __________ seconds. 5. Release the tension slowly. Relax your neck muscles completely before you repeat this exercise. Repeat __________ times. Complete this exercise __________ times a day. Posture and body mechanics  Body mechanics refers to the movements and positions of your body while you do your daily activities. Posture is part of body mechanics. Good posture and healthy body mechanics can help to relieve stress in your body's tissues and joints. Good posture means that your spine is in its natural S-curve position (your spine is neutral), your shoulders are pulled back slightly, and your head is not tipped forward. The following are general guidelines for applying improved posture and body mechanics to your everyday activities. Standing  When standing, keep your spine neutral and keep your feet about hip-width apart. Keep a slight bend in your knees. Your ears, shoulders, and hips should line up.  When you do a task in which you stand in one place for a long time, place one foot up on a stable object that is 2-4 inches (5-10 cm) high, such as a footstool. This helps keep your spine neutral. Sitting   When sitting, keep your spine neutral and your keep feet flat on the floor. Use a footrest, if necessary, and keep your thighs parallel to the floor. Avoid rounding your shoulders, and avoid tilting your head forward.  When working at a desk or a computer, keep your desk at a height where your hands are slightly lower than your elbows. Slide your chair under your desk so you are close enough to maintain good posture.  When working at a computer, place your monitor at a height where you are looking straight ahead and you do not have to tilt your head forward or downward to look at the screen. Resting When lying down and resting, avoid positions that are most painful for you. Try to support your neck in a neutral position. You can use a contour pillow or a  small rolled-up towel. Your pillow should support your neck but not push on it. This information is not intended to replace advice given to you by your health care provider. Make sure you discuss any questions you have with your health care provider. Document Released: 08/12/2005 Document Revised: 04/18/2016 Document Reviewed: 07/19/2015 Elsevier Interactive Patient Education  2018  [employer].

## 2017-10-30 ENCOUNTER — Encounter: Payer: Self-pay | Admitting: Family Medicine

## 2017-10-30 ENCOUNTER — Ambulatory Visit: Payer: BLUE CROSS/BLUE SHIELD | Admitting: Family Medicine

## 2017-10-30 VITALS — BP 137/91 | HR 88 | Temp 98.3°F | Ht 66.0 in | Wt 168.9 lb

## 2017-10-30 DIAGNOSIS — M9900 Segmental and somatic dysfunction of head region: Secondary | ICD-10-CM

## 2017-10-30 DIAGNOSIS — M9901 Segmental and somatic dysfunction of cervical region: Secondary | ICD-10-CM | POA: Diagnosis not present

## 2017-10-30 DIAGNOSIS — M9902 Segmental and somatic dysfunction of thoracic region: Secondary | ICD-10-CM

## 2017-10-30 DIAGNOSIS — M542 Cervicalgia: Secondary | ICD-10-CM | POA: Diagnosis not present

## 2017-10-30 NOTE — Assessment & Plan Note (Signed)
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 Stressed the importance of stretching multiple times a day given his job. Continue PRN flexeril and naproxen. He does have somatic dysfunction that seems to be contributing to his symptoms. Treated today with good results as below.

## 2017-10-30 NOTE — Progress Notes (Signed)
BP (!) 137/91   Pulse 88   Temp 98.3 F (36.8 C) (Oral)   Ht 5\' 6"  (1.676 m)   Wt 168 lb 14.4 oz (76.6 kg)   SpO2 98%   BMI 27.26 kg/m     Subjective:    Patient ID: David Quinn, male    DOB: 1956/01/04, 62 y.o.   MRN: [id number]  HPI: [name] is a 62 y.o. male  Chief Complaint  Patient presents with  . Neck Pain    OMM evaluation   David Quinn presents today at the request of his PCP for evaluation and possible treatment with OMT for neck pain. He notes that his neck has been hurting for about 3 weeks. It's not hurting right now, but when he extends his neck, that's when it tends to hurt. Makes a popping sound. Pain is just in the back in the paraspinals and pulls into the L shoulder. Extending makes it worse. His job has a lot of flexion and then when he gets out of it it tends to be worse. 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 He has not done anything to see if gets better. His PCP gave him naproxen and flexeril, which seem to be helping. Has not started any of his exercises yet. He is otherwise feeling well. He has not had any injuries to his neck. He is otherwise feeling well with no other concerns or complaints at this time.    Relevant past medical, surgical, family and social history reviewed and updated as indicated. Interim medical history since our last visit reviewed. Allergies and medications reviewed and updated.  Review of Systems  Constitutional: Negative.   Respiratory: Negative.   Cardiovascular: Negative.   Musculoskeletal: Positive for myalgias, neck pain and neck stiffness. Negative for arthralgias, back pain, gait problem and joint swelling.  Neurological: Negative.   Psychiatric/Behavioral: Negative.     Per HPI unless specifically indicated above     Objective:    BP (!) 137/91   Pulse 88   Temp 98.3 F (36.8 C) (Oral)   Ht 5\' 6"  (1.676 m)   Wt 168 lb 14.4 oz (76.6 kg)   SpO2 98%    BMI 27.26 kg/m    Wt Readings from Last 3 Encounters:  10/30/17 168 lb 14.4 oz (76.6 kg)  10/27/17 162 lb 9.6 oz (73.8 kg)  10/08/17 167 lb 3.2 oz (75.8 kg)    Physical Exam  Constitutional: He is oriented to person, place, and time. He appears well-developed and well-nourished. No distress.  HENT:  Head: Normocephalic and atraumatic.  Right Ear: Hearing normal.  Left Ear: Hearing normal.  Nose: Nose normal.  Eyes: Conjunctivae and lids are normal. Right eye exhibits no discharge. Left eye exhibits no discharge. No scleral icterus.  Cardiovascular: Normal rate, regular rhythm, normal heart sounds and intact distal pulses. Exam reveals no gallop and no friction rub.  No murmur heard. Pulmonary/Chest: Effort normal and breath sounds normal. No respiratory distress. He has no wheezes. He has no rales. He exhibits no tenderness.  Neurological: He is alert and oriented to person, place, and time. He has normal reflexes. He displays normal reflexes. No cranial nerve deficit. He exhibits normal muscle tone. Coordination normal.  Skin: Skin is warm, dry and intact. No rash noted. He is not diaphoretic. No erythema. No pallor.  Psychiatric: He has a normal mood and affect. His speech is normal and behavior  is normal. Judgment and thought content normal. Cognition and memory are normal.  Nursing note and vitals reviewed. Neck Exam:    Tenderness to Palpation: yes    Midline cervical spine: no    Paraspinal neck musculature: yes    Trapezius: yes    Sternocleidomastoid: yes     Range of Motion:     Flexion: Normal    Extension: Decreased    Lateral rotation: Decreased    Lateral bending: Decreased     Neuro Examination: Upper extremity DTRs normal & symmetric.  Strength and sensation intact.    Special Tests:     Spurling test: negative     Addison's Test: Negative bilaterally Musculoskeletal:  Exam found Decreased ROM, Tissue texture changes, Tenderness to palpation and Asymmetry of  patient's  head, neck and thorax Osteopathic Structural Exam:   Head: OAESSL, hypertonic suboccipital muscles   Neck: C3ESRR, SCM hypertonic in spasm on the L, trap spasm on the L, scalenes hypertonic on the L  Thorax: T1-3 SRRL   Results for orders placed or performed in visit on 10/08/17  CBC with Differential/Platelet  Result Value Ref Range   WBC 3.7 3.4 - 10.8 x10E3/uL   RBC 5.06 4.14 - 5.80 x10E6/uL   Hemoglobin 13.3 13.0 - 17.7 g/dL   Hematocrit 16.1 09.6 - 51.0 %   MCV 80 79 - 97 fL   MCH 26.3 (L) 26.6 - 33.0 pg   MCHC 32.9 31.5 - 35.7 g/dL   RDW 04.5 40.9 - 81.1 %   Platelets 314 150 - 379 x10E3/uL   Neutrophils 43 Not Estab. %   Lymphs 31 Not Estab. %   Monocytes 14 Not Estab. %   Eos 11 Not Estab. %   Basos 1 Not Estab. %   Neutrophils Absolute 1.6 1.4 - 7.0 x10E3/uL   Lymphocytes Absolute 1.2 0.7 - 3.1 x10E3/uL   Monocytes Absolute 0.5 0.1 - 0.9 x10E3/uL   EOS (ABSOLUTE) 0.4 0.0 - 0.4 x10E3/uL   Basophils Absolute 0.0 0.0 - 0.2 x10E3/uL   Immature Granulocytes 0 Not Estab. %   Immature Grans (Abs) 0.0 0.0 - 0.1 x10E3/uL  Comprehensive metabolic panel  Result Value Ref Range   Glucose 82 65 - 99 mg/dL   BUN 16 8 - 27 mg/dL   Creatinine, Ser 9.14 0.76 - 1.27 mg/dL   GFR calc non Af Amer 77 >59 mL/min/1.73   GFR calc Af Amer 89 >59 mL/min/1.73   BUN/Creatinine Ratio 15 10 - 24   Sodium 139 134 - 144 mmol/L   Potassium 4.7 3.5 - 5.2 mmol/L   Chloride 99 96 - 106 mmol/L   CO2 25 20 - 29 mmol/L   Calcium 9.7 8.6 - 10.2 mg/dL   Total Protein 7.5 6.0 - 8.5 g/dL   Albumin 4.3 3.6 - 4.8 g/dL   Globulin, Total 3.2 1.5 - 4.5 g/dL   Albumin/Globulin Ratio 1.3 1.2 - 2.2   Bilirubin Total 0.4 0.0 - 1.2 mg/dL   Alkaline Phosphatase 77 39 - 117 IU/L   AST 26 0 - 40 IU/L   ALT 25 0 - 44 IU/L  Lipid Panel w/o Chol/HDL Ratio  Result Value Ref Range   Cholesterol, Total 215 (H) 100 - 199 mg/dL   Triglycerides 67 0 - 149 mg/dL   HDL 64 >78 mg/dL   VLDL Cholesterol Cal  13 5 - 40 mg/dL   LDL Calculated 295 (H) 0 - 99 mg/dL  PSA  Result Value Ref Range  Prostate Specific Ag, Serum 2.7 0.0 - 4.0 ng/mL  TSH  Result Value Ref Range   TSH 1.610 0.450 - 4.500 uIU/mL      Assessment & Plan:   Problem List Items Addressed This Visit      Other   Cervical pain (neck) - Primary    Significant SCM spasm on the L, 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 Stressed the importance of stretching multiple times a day given his job. Continue PRN flexeril and naproxen. He does have somatic dysfunction that seems to be contributing to his symptoms. Treated today with good results as below.        Other Visit Diagnoses    Cranial somatic dysfunction       Somatic dysfunction of cervical region       Thoracic region somatic dysfunction         After verbal consent was obtained, patient was treated today with osteopathic manipulative medicine to the regions of the head, neck and thorax using the techniques of cranial, FPR, myofascial release, muscle energy and soft tissue. Areas of compensation relating to his primary pain source also treated. Patient tolerated the procedure well with fair objective and fair subjective improvement in symptoms. He left the room in good condition. He was advised to stay well hydrated and that he may have some soreness following the procedure. If not improving or worsening, he will call and come in. Home exercise program of stretches for neck and SCM discussed and demonstrated today. Patient will do these stretches BID to before the point of pain, and will return for reevaluation  on a PRN basis.  Follow up plan: PRN

## 2018-03-11 ENCOUNTER — Encounter: Payer: Self-pay | Admitting: Unknown Physician Specialty

## 2018-04-07 ENCOUNTER — Ambulatory Visit: Payer: BLUE CROSS/BLUE SHIELD | Admitting: Unknown Physician Specialty

## 2018-04-07 ENCOUNTER — Other Ambulatory Visit: Payer: Self-pay

## 2018-04-07 ENCOUNTER — Ambulatory Visit: Payer: BLUE CROSS/BLUE SHIELD | Admitting: Physician Assistant

## 2018-04-07 ENCOUNTER — Encounter: Payer: Self-pay | Admitting: Physician Assistant

## 2018-04-07 VITALS — BP 129/85 | HR 80 | Temp 98.2°F | Ht 66.0 in | Wt 164.4 lb

## 2018-04-07 DIAGNOSIS — I10 Essential (primary) hypertension: Secondary | ICD-10-CM

## 2018-04-07 DIAGNOSIS — J4540 Moderate persistent asthma, uncomplicated: Secondary | ICD-10-CM | POA: Diagnosis not present

## 2018-04-07 DIAGNOSIS — E7800 Pure hypercholesterolemia, unspecified: Secondary | ICD-10-CM

## 2018-04-07 NOTE — Progress Notes (Signed)
Subjective:    Patient ID: David MessierDaniel L Quinn, male    DOB: 12/20/1955, 62 y.o.   MRN: [id number]  David MessierDaniel L Quinn is a 62 y.o. male presenting on 04/07/2018 for Hypertension (6 m f/u) and Hyperlipidemia   HPI   HTN: 10 mg lisinopril, tolerated well  Asthma: Taking advair daily, proair PRN. Has to use proair not even once weekly.   HLD:  The 10-year ASCVD risk score Denman George(Goff DC Jr., et al., 2013) is: 13.4%   Values used to calculate the score:     Age: [age] years     Sex: Male     Is Non-Hispanic African American: Yes     Diabetic: No     Tobacco smoker: No     Systolic Blood Pressure: 129 mmHg     Is BP treated: Yes     HDL Cholesterol: 64 mg/dL     Total Cholesterol: 215 mg/dL   Social History   Tobacco Use  . Smoking status: Never Smoker  . Smokeless tobacco: Never Used  Substance Use Topics  . Alcohol use: No    Alcohol/week: 0.0 standard drinks  . Drug use: No    Review of Systems Per HPI unless specifically indicated above     Objective:    BP 129/85   Pulse 80   Temp 98.2 F (36.8 C) (Oral)   Ht 5\' 6"  (1.676 m)   Wt 164 lb 6.4 oz (74.6 kg)   SpO2 96%   BMI 26.53 kg/m   Wt Readings from Last 3 Encounters:  04/07/18 164 lb 6.4 oz (74.6 kg)  10/30/17 168 lb 14.4 oz (76.6 kg)  10/27/17 162 lb 9.6 oz (73.8 kg)    Physical Exam  Constitutional: He is oriented to person, place, and time. He appears well-developed and well-nourished.  Cardiovascular: Normal rate and regular rhythm.  Pulmonary/Chest: Effort normal and breath sounds normal.  Neurological: He is alert and oriented to person, place, and time.  Skin: Skin is warm and dry.  Psychiatric: He has a normal mood and affect. His behavior is normal.   Results for orders placed or performed in visit on 10/08/17  CBC with Differential/Platelet  Result Value Ref Range   WBC 3.7 3.4 - 10.8 x10E3/uL   RBC 5.06 4.14 - 5.80 x10E6/uL   Hemoglobin 13.3 13.0 - 17.7 g/dL   Hematocrit 40.940.4 81.137.5 - 51.0 %     MCV 80 79 - 97 fL   MCH 26.3 (L) 26.6 - 33.0 pg   MCHC 32.9 31.5 - 35.7 g/dL   RDW 91.414.2 78.212.3 - 95.615.4 %   Platelets 314 150 - 379 x10E3/uL   Neutrophils 43 Not Estab. %   Lymphs 31 Not Estab. %   Monocytes 14 Not Estab. %   Eos 11 Not Estab. %   Basos 1 Not Estab. %   Neutrophils Absolute 1.6 1.4 - 7.0 x10E3/uL   Lymphocytes Absolute 1.2 0.7 - 3.1 x10E3/uL   Monocytes Absolute 0.5 0.1 - 0.9 x10E3/uL   EOS (ABSOLUTE) 0.4 0.0 - 0.4 x10E3/uL   Basophils Absolute 0.0 0.0 - 0.2 x10E3/uL   Immature Granulocytes 0 Not Estab. %   Immature Grans (Abs) 0.0 0.0 - 0.1 x10E3/uL  Comprehensive metabolic panel  Result Value Ref Range   Glucose 82 65 - 99 mg/dL   BUN 16 8 - 27 mg/dL   Creatinine, Ser 2.131.04 0.76 - 1.27 mg/dL   GFR calc non Af Amer 77 >59 mL/min/1.73   GFR  calc Af Amer 89 >59 mL/min/1.73   BUN/Creatinine Ratio 15 10 - 24   Sodium 139 134 - 144 mmol/L   Potassium 4.7 3.5 - 5.2 mmol/L   Chloride 99 96 - 106 mmol/L   CO2 25 20 - 29 mmol/L   Calcium 9.7 8.6 - 10.2 mg/dL   Total Protein 7.5 6.0 - 8.5 g/dL   Albumin 4.3 3.6 - 4.8 g/dL   Globulin, Total 3.2 1.5 - 4.5 g/dL   Albumin/Globulin Ratio 1.3 1.2 - 2.2   Bilirubin Total 0.4 0.0 - 1.2 mg/dL   Alkaline Phosphatase 77 39 - 117 IU/L   AST 26 0 - 40 IU/L   ALT 25 0 - 44 IU/L  Lipid Panel w/o Chol/HDL Ratio  Result Value Ref Range   Cholesterol, Total 215 (H) 100 - 199 mg/dL   Triglycerides 67 0 - 149 mg/dL   HDL 64 >21>39 mg/dL   VLDL Cholesterol Cal 13 5 - 40 mg/dL   LDL Calculated 308138 (H) 0 - 99 mg/dL  PSA  Result Value Ref Range   Prostate Specific Ag, Serum 2.7 0.0 - 4.0 ng/mL  TSH  Result Value Ref Range   TSH 1.610 0.450 - 4.500 uIU/mL      Assessment & Plan:  1. Pure hypercholesterolemia  Strongly recommend statin. Patient refuses.   2. Essential hypertension  Stable, continue 10 mg Lisinopril.   3. Moderate persistent asthma without complication  Stable, continue advair daily and proair PRN.     Follow up plan: Return in about 6 months (around 10/08/2018) for cpe.  Osvaldo AngstAdriana Muranda Coye, PA-C [facility]  [facility] 04/07/2018, 3:47 PM

## 2018-07-15 ENCOUNTER — Other Ambulatory Visit: Payer: Self-pay | Admitting: Unknown Physician Specialty

## 2018-10-13 ENCOUNTER — Ambulatory Visit (INDEPENDENT_AMBULATORY_CARE_PROVIDER_SITE_OTHER): Payer: BLUE CROSS/BLUE SHIELD | Admitting: Nurse Practitioner

## 2018-10-13 ENCOUNTER — Encounter: Payer: Self-pay | Admitting: Nurse Practitioner

## 2018-10-13 VITALS — BP 118/80 | HR 86 | Temp 98.0°F | Ht 64.65 in | Wt 154.5 lb

## 2018-10-13 DIAGNOSIS — Z0000 Encounter for general adult medical examination without abnormal findings: Secondary | ICD-10-CM

## 2018-10-13 DIAGNOSIS — E7800 Pure hypercholesterolemia, unspecified: Secondary | ICD-10-CM | POA: Diagnosis not present

## 2018-10-13 DIAGNOSIS — J4540 Moderate persistent asthma, uncomplicated: Secondary | ICD-10-CM | POA: Diagnosis not present

## 2018-10-13 DIAGNOSIS — I10 Essential (primary) hypertension: Secondary | ICD-10-CM | POA: Diagnosis not present

## 2018-10-13 NOTE — Progress Notes (Signed)
BP 118/80 (BP Location: Left Arm)   Pulse 86   Temp 98 F (36.7 C)   Ht 5' 4.65" (1.642 m)   Wt 154 lb 8 oz (70.1 kg)   SpO2 97%   BMI 25.99 kg/m    Subjective:    Patient ID: David Quinn, male    DOB: 03/02/1956, 63 y.o.   MRN: [id number]  HPI: David Quinn is a 63 y.o. male presenting on 10/13/2018 for comprehensive medical examination. Current medical complaints include:none  He currently lives with: wife Interim Problems from his last visit: no   HYPERTENSION Hypertension status: stable  Satisfied with current treatment? yes Duration of hypertension: chronic BP monitoring frequency:  not checking BP range:  BP medication side effects:  no Medication compliance: good compliance Aspirin: yes Recurrent headaches: no Visual changes: no Palpitations: no Dyspnea: no Chest pain: no Lower extremity edema: no Dizzy/lightheaded: no   ASTHMA Asthma status: stable Satisfied with current treatment?: yes Albuterol/rescue inhaler frequency: once a month Dyspnea frequency: none Wheezing frequency: none Cough frequency:  2 - 3 times, varies based on day Nocturnal symptom frequency:  Limitation of activity: no Current upper respiratory symptoms: no Triggers:  Home peak flows: Last Spirometry:  Failed/intolerant to following asthma meds:  Asthma meds in past:  Aerochamber/spacer use: no Visits to ER or Urgent Care in past year: no Pneumovax: Not up to Date Influenza: refuses   Functional Status Survey: Is the patient deaf or have difficulty hearing?: No Does the patient have difficulty seeing, even when wearing glasses/contacts?: No Does the patient have difficulty concentrating, remembering, or making decisions?: No Does the patient have difficulty walking or climbing stairs?: No Does the patient have difficulty dressing or bathing?: No Does the patient have difficulty doing errands alone such as visiting a doctor's office or shopping?: No  FALL  RISK: Fall Risk  10/13/2018 10/06/2015  Falls in the past year? 0 No  Number falls in past yr: 0 -  Injury with Fall? 0 -    Depression Screen Depression screen [facility]HQ 2/9 10/13/2018 10/08/2017 10/23/2016 10/06/2015  Decreased Interest 0 0 0 0  Down, Depressed, Hopeless 0 0 0 0  PHQ - 2 Score 0 0 0 0  Altered sleeping - 0 - -  Tired, decreased energy - 0 - -  Change in appetite - 0 - -  Feeling bad or failure about yourself  - 0 - -  Trouble concentrating - 0 - -  Moving slowly or fidgety/restless - 0 - -  Suicidal thoughts - 0 - -  PHQ-9 Score - 0 - -    Advanced Directives None  Past Medical History:  Past Medical History:  Diagnosis Date  . Asthma   . Hypertension     Surgical History:  Past Surgical History:  Procedure Laterality Date  . JOINT REPLACEMENT Bilateral    hip replacement  . TRACHEOSTOMY      Medications:  Current Outpatient Medications on File Prior to Visit  Medication Sig  . albuterol (PROAIR HFA) 108 (90 Base) MCG/ACT inhaler Inhale 2 puffs into the lungs every 6 (six) hours as needed for wheezing or shortness of breath.  Marland Kitchen. aspirin 81 MG tablet Take 1 tablet (81 mg total) by mouth daily.  . cholecalciferol (VITAMIN D) 1000 UNITS tablet Take 1,000 Units by mouth daily.  . Fluticasone-Salmeterol (ADVAIR DISKUS) 250-50 MCG/DOSE AEPB Inhale 2 puffs into the lungs daily.  Marland Kitchen. ibuprofen (ADVIL,MOTRIN) 600 MG tablet Take 600 mg by  mouth every 6 (six) hours as needed. for pain  . lisinopril (PRINIVIL,ZESTRIL) 10 MG tablet TAKE 1 TABLET BY MOUTH EVERY DAY  . Multiple Vitamin (MULTIVITAMIN) tablet Take 1 tablet by mouth daily.   No current facility-administered medications on file prior to visit.     Allergies:  No Known Allergies  Social History:  Social History   Socioeconomic History  . Marital status: Married    Spouse name: Not on file  . Number of children: Not on file  . Years of education: Not on file  . Highest education level: Not on file   Occupational History  . Not on file  Social Needs  . Financial resource strain: Not on file  . Food insecurity:    Worry: Not on file    Inability: Not on file  . Transportation needs:    Medical: Not on file    Non-medical: Not on file  Tobacco Use  . Smoking status: Never Smoker  . Smokeless tobacco: Never Used  Substance and Sexual Activity  . Alcohol use: No    Alcohol/week: 0.0 standard drinks  . Drug use: No  . Sexual activity: Yes  Lifestyle  . Physical activity:    Days per week: Not on file    Minutes per session: Not on file  . Stress: Not on file  Relationships  . Social connections:    Talks on phone: Not on file    Gets together: Not on file    Attends religious service: Not on file    Active member of club or organization: Not on file    Attends meetings of clubs or organizations: Not on file    Relationship status: Not on file  . Intimate partner violence:    Fear of current or ex partner: Not on file    Emotionally abused: Not on file    Physically abused: Not on file    Forced sexual activity: Not on file  Other Topics Concern  . Not on file  Social History Narrative  . Not on file   Social History   Tobacco Use  Smoking Status Never Smoker  Smokeless Tobacco Never Used   Social History   Substance and Sexual Activity  Alcohol Use No  . Alcohol/week: 0.0 standard drinks    Family History:  Family History  Problem Relation Age of Onset  . Asthma Mother   . Cancer Mother   . Hyperlipidemia Mother   . Hypertension Mother   . Hypertension Father   . Stroke Father   . Cancer Brother        stomach  . Seizures Brother     Past medical history, surgical history, medications, allergies, family history and social history reviewed with patient today and changes made to appropriate areas of the chart.   Review of Systems - Negative All other ROS negative except what is listed above and in the HPI.      Objective:    BP 118/80 (BP  Location: Left Arm)   Pulse 86   Temp 98 F (36.7 C)   Ht 5' 4.65" (1.642 m)   Wt 154 lb 8 oz (70.1 kg)   SpO2 97%   BMI 25.99 kg/m   Wt Readings from Last 3 Encounters:  10/13/18 154 lb 8 oz (70.1 kg)  04/07/18 164 lb 6.4 oz (74.6 kg)  10/30/17 168 lb 14.4 oz (76.6 kg)    Physical Exam Vitals signs and nursing note reviewed.  Constitutional:  Appearance: He is well-developed.  HENT:     Head: Normocephalic and atraumatic.     Right Ear: Hearing, tympanic membrane, ear canal and external ear normal. No decreased hearing noted. No drainage.     Left Ear: Hearing, tympanic membrane, ear canal and external ear normal. No decreased hearing noted. No drainage.     Nose: Nose normal.     Right Sinus: No maxillary sinus tenderness or frontal sinus tenderness.     Left Sinus: No maxillary sinus tenderness or frontal sinus tenderness.     Mouth/Throat:     Pharynx: Uvula midline.  Eyes:     General: Lids are normal.        Right eye: No discharge.        Left eye: No discharge.     Conjunctiva/sclera: Conjunctivae normal.     Pupils: Pupils are equal, round, and reactive to light.  Neck:     Musculoskeletal: Normal range of motion and neck supple.     Thyroid: No thyromegaly.     Vascular: No carotid bruit or JVD.     Trachea: Trachea normal.  Cardiovascular:     Rate and Rhythm: Normal rate and regular rhythm.     Heart sounds: Normal heart sounds, S1 normal and S2 normal. No murmur. No gallop.   Pulmonary:     Effort: Pulmonary effort is normal.     Breath sounds: Normal breath sounds.  Abdominal:     General: Bowel sounds are normal.     Palpations: Abdomen is soft. There is no hepatomegaly or splenomegaly.  Genitourinary:    Penis: Normal.      Rectum: Normal.  Musculoskeletal: Normal range of motion.  Lymphadenopathy:     Cervical: No cervical adenopathy.  Skin:    General: Skin is warm and dry.     Capillary Refill: Capillary refill takes less than 2 seconds.       Findings: No rash.  Neurological:     Mental Status: He is alert and oriented to person, place, and time.     Deep Tendon Reflexes: Reflexes are normal and symmetric.     Reflex Scores:      Brachioradialis reflexes are 2+ on the right side and 2+ on the left side.      Patellar reflexes are 2+ on the right side and 2+ on the left side. Psychiatric:        Behavior: Behavior normal.        Thought Content: Thought content normal.        Judgment: Judgment normal.      Results for orders placed or performed in visit on 10/08/17  CBC with Differential/Platelet  Result Value Ref Range   WBC 3.7 3.4 - 10.8 x10E3/uL   RBC 5.06 4.14 - 5.80 x10E6/uL   Hemoglobin 13.3 13.0 - 17.7 g/dL   Hematocrit 16.1 09.6 - 51.0 %   MCV 80 79 - 97 fL   MCH 26.3 (L) 26.6 - 33.0 pg   MCHC 32.9 31.5 - 35.7 g/dL   RDW 04.5 40.9 - 81.1 %   Platelets 314 150 - 379 x10E3/uL   Neutrophils 43 Not Estab. %   Lymphs 31 Not Estab. %   Monocytes 14 Not Estab. %   Eos 11 Not Estab. %   Basos 1 Not Estab. %   Neutrophils Absolute 1.6 1.4 - 7.0 x10E3/uL   Lymphocytes Absolute 1.2 0.7 - 3.1 x10E3/uL   Monocytes Absolute 0.5 0.1 - 0.9 x10E3/uL  EOS (ABSOLUTE) 0.4 0.0 - 0.4 x10E3/uL   Basophils Absolute 0.0 0.0 - 0.2 x10E3/uL   Immature Granulocytes 0 Not Estab. %   Immature Grans (Abs) 0.0 0.0 - 0.1 x10E3/uL  Comprehensive metabolic panel  Result Value Ref Range   Glucose 82 65 - 99 mg/dL   BUN 16 8 - 27 mg/dL   Creatinine, Ser 1.611.04 0.76 - 1.27 mg/dL   GFR calc non Af Amer 77 >59 mL/min/1.73   GFR calc Af Amer 89 >59 mL/min/1.73   BUN/Creatinine Ratio 15 10 - 24   Sodium 139 134 - 144 mmol/L   Potassium 4.7 3.5 - 5.2 mmol/L   Chloride 99 96 - 106 mmol/L   CO2 25 20 - 29 mmol/L   Calcium 9.7 8.6 - 10.2 mg/dL   Total Protein 7.5 6.0 - 8.5 g/dL   Albumin 4.3 3.6 - 4.8 g/dL   Globulin, Total 3.2 1.5 - 4.5 g/dL   Albumin/Globulin Ratio 1.3 1.2 - 2.2   Bilirubin Total 0.4 0.0 - 1.2 mg/dL   Alkaline  Phosphatase 77 39 - 117 IU/L   AST 26 0 - 40 IU/L   ALT 25 0 - 44 IU/L  Lipid Panel w/o Chol/HDL Ratio  Result Value Ref Range   Cholesterol, Total 215 (H) 100 - 199 mg/dL   Triglycerides 67 0 - 149 mg/dL   HDL 64 >09>39 mg/dL   VLDL Cholesterol Cal 13 5 - 40 mg/dL   LDL Calculated 604138 (H) 0 - 99 mg/dL  PSA  Result Value Ref Range   Prostate Specific Ag, Serum 2.7 0.0 - 4.0 ng/mL  TSH  Result Value Ref Range   TSH 1.610 0.450 - 4.500 uIU/mL      Assessment & Plan:   Problem List Items Addressed This Visit      Cardiovascular and Mediastinum   Hypertension    Chronic, ongoing.  Continue current medication regimen.  Initial BP elevated today, but repeat below goal.  Annual labs today.        Respiratory   Asthma, moderate persistent    Chronic, ongoing.  Continue current medication regimen.  Minimal use of rescue inhaler.        Other   Hyperlipidemia    Chronic, ongoing.  No current medications.  Lipid panel today.       Other Visit Diagnoses    Annual physical exam    -  Primary   Relevant Orders   CBC with Differential/Platelet   Comprehensive metabolic panel   Lipid Panel w/o Chol/HDL Ratio   PSA   TSH        Discussed aspirin prophylaxis for myocardial infarction prevention and decision was made to continue ASA  LABORATORY TESTING:  Health maintenance labs ordered today as discussed above.   The natural history of prostate cancer and ongoing controversy regarding screening and potential treatment outcomes of prostate cancer has been discussed with the patient. The meaning of a false positive PSA and a false negative PSA has been discussed. He indicates understanding of the limitations of this screening test and wishes to proceed with screening PSA testing.   IMMUNIZATIONS:   - Tdap: Tetanus vaccination status reviewed: up to date - Influenza: Refused - Pneumovax: Not applicable - Prevnar: Not applicable - Zostavax vaccine: Refused  SCREENING: -  Colonoscopy: Up to date  Discussed with patient purpose of the colonoscopy is to detect colon cancer at curable precancerous or early stages   - AAA Screening: Not applicable  -Hearing  Test: Not applicable  -Spirometry: Not applicable   PATIENT COUNSELING:    Sexuality: Discussed sexually transmitted diseases, partner selection, use of condoms, avoidance of unintended pregnancy  and contraceptive alternatives.   Advised to avoid cigarette smoking.  I discussed with the patient that most people either abstain from alcohol or drink within safe limits (<=14/week and <=4 drinks/occasion for males, <=7/weeks and <= 3 drinks/occasion for females) and that the risk for alcohol disorders and other health effects rises proportionally with the number of drinks per week and how often a drinker exceeds daily limits.  Discussed cessation/primary prevention of drug use and availability of treatment for abuse.   Diet: Encouraged to adjust caloric intake to maintain  or achieve ideal body weight, to reduce intake of dietary saturated fat and total fat, to limit sodium intake by avoiding high sodium foods and not adding table salt, and to maintain adequate dietary potassium and calcium preferably from fresh fruits, vegetables, and low-fat dairy products.    stressed the importance of regular exercise  Injury prevention: Discussed safety belts, safety helmets, smoke detector, smoking near bedding or upholstery.   Dental health: Discussed importance of regular tooth brushing, flossing, and dental visits.   Follow up plan: NEXT PREVENTATIVE PHYSICAL DUE IN 1 YEAR. Return in about 6 months (around 04/13/2019).

## 2018-10-13 NOTE — Assessment & Plan Note (Signed)
Chronic, ongoing.  No current medications.  Lipid panel today. 

## 2018-10-13 NOTE — Patient Instructions (Signed)
DASH Eating Plan  DASH stands for "Dietary Approaches to Stop Hypertension." The DASH eating plan is a healthy eating plan that has been shown to reduce high blood pressure (hypertension). It may also reduce your risk for type 2 diabetes, heart disease, and stroke. The DASH eating plan may also help with weight loss.  What are tips for following this plan?    General guidelines   Avoid eating more than 2,300 mg (milligrams) of salt (sodium) a day. If you have hypertension, you may need to reduce your sodium intake to 1,500 mg a day.   Limit alcohol intake to no more than 1 drink a day for nonpregnant women and 2 drinks a day for men. One drink equals 12 oz of beer, 5 oz of wine, or 1 oz of hard liquor.   Work with your health care provider to maintain a healthy body weight or to lose weight. Ask what an ideal weight is for you.   Get at least 30 minutes of exercise that causes your heart to beat faster (aerobic exercise) most days of the week. Activities may include walking, swimming, or biking.   Work with your health care provider or diet and nutrition specialist (dietitian) to adjust your eating plan to your individual calorie needs.  Reading food labels     Check food labels for the amount of sodium per serving. Choose foods with less than 5 percent of the Daily Value of sodium. Generally, foods with less than 300 mg of sodium per serving fit into this eating plan.   To find whole grains, look for the word "whole" as the first word in the ingredient list.  Shopping   Buy products labeled as "low-sodium" or "no salt added."   Buy fresh foods. Avoid canned foods and premade or frozen meals.  Cooking   Avoid adding salt when cooking. Use salt-free seasonings or herbs instead of table salt or sea salt. Check with your health care provider or pharmacist before using salt substitutes.   Do not fry foods. Cook foods using healthy methods such as baking, boiling, grilling, and broiling instead.   Cook with  heart-healthy oils, such as olive, canola, soybean, or sunflower oil.  Meal planning   Eat a balanced diet that includes:  ? 5 or more servings of fruits and vegetables each day. At each meal, try to fill half of your plate with fruits and vegetables.  ? Up to 6-8 servings of whole grains each day.  ? Less than 6 oz of lean meat, poultry, or fish each day. A 3-oz serving of meat is about the same size as a deck of cards. One egg equals 1 oz.  ? 2 servings of low-fat dairy each day.  ? A serving of nuts, seeds, or beans 5 times each week.  ? Heart-healthy fats. Healthy fats called Omega-3 fatty acids are found in foods such as flaxseeds and coldwater fish, like sardines, salmon, and mackerel.   Limit how much you eat of the following:  ? Canned or prepackaged foods.  ? Food that is high in trans fat, such as fried foods.  ? Food that is high in saturated fat, such as fatty meat.  ? Sweets, desserts, sugary drinks, and other foods with added sugar.  ? Full-fat dairy products.   Do not salt foods before eating.   Try to eat at least 2 vegetarian meals each week.   Eat more home-cooked food and less restaurant, buffet, and fast food.     When eating at a restaurant, ask that your food be prepared with less salt or no salt, if possible.  What foods are recommended?  The items listed may not be a complete list. Talk with your dietitian about what dietary choices are best for you.  Grains  Whole-grain or whole-wheat bread. Whole-grain or whole-wheat pasta. Brown rice. Oatmeal. Quinoa. Bulgur. Whole-grain and low-sodium cereals. Pita bread. Low-fat, low-sodium crackers. Whole-wheat flour tortillas.  Vegetables  Fresh or frozen vegetables (raw, steamed, roasted, or grilled). Low-sodium or reduced-sodium tomato and vegetable juice. Low-sodium or reduced-sodium tomato sauce and tomato paste. Low-sodium or reduced-sodium canned vegetables.  Fruits  All fresh, dried, or frozen fruit. Canned fruit in natural juice (without  added sugar).  Meat and other protein foods  Skinless chicken or turkey. Ground chicken or turkey. Pork with fat trimmed off. Fish and seafood. Egg whites. Dried beans, peas, or lentils. Unsalted nuts, nut butters, and seeds. Unsalted canned beans. Lean cuts of beef with fat trimmed off. Low-sodium, lean deli meat.  Dairy  Low-fat (1%) or fat-free (skim) milk. Fat-free, low-fat, or reduced-fat cheeses. Nonfat, low-sodium ricotta or cottage cheese. Low-fat or nonfat yogurt. Low-fat, low-sodium cheese.  Fats and oils  Soft margarine without trans fats. Vegetable oil. Low-fat, reduced-fat, or light mayonnaise and salad dressings (reduced-sodium). Canola, safflower, olive, soybean, and sunflower oils. Avocado.  Seasoning and other foods  Herbs. Spices. Seasoning mixes without salt. Unsalted popcorn and pretzels. Fat-free sweets.  What foods are not recommended?  The items listed may not be a complete list. Talk with your dietitian about what dietary choices are best for you.  Grains  Baked goods made with fat, such as croissants, muffins, or some breads. Dry pasta or rice meal packs.  Vegetables  Creamed or fried vegetables. Vegetables in a cheese sauce. Regular canned vegetables (not low-sodium or reduced-sodium). Regular canned tomato sauce and paste (not low-sodium or reduced-sodium). Regular tomato and vegetable juice (not low-sodium or reduced-sodium). Pickles. Olives.  Fruits  Canned fruit in a light or heavy syrup. Fried fruit. Fruit in cream or butter sauce.  Meat and other protein foods  Fatty cuts of meat. Ribs. Fried meat. Bacon. Sausage. Bologna and other processed lunch meats. Salami. Fatback. Hotdogs. Bratwurst. Salted nuts and seeds. Canned beans with added salt. Canned or smoked fish. Whole eggs or egg yolks. Chicken or turkey with skin.  Dairy  Whole or 2% milk, cream, and half-and-half. Whole or full-fat cream cheese. Whole-fat or sweetened yogurt. Full-fat cheese. Nondairy creamers. Whipped toppings.  Processed cheese and cheese spreads.  Fats and oils  Butter. Stick margarine. Lard. Shortening. Ghee. Bacon fat. Tropical oils, such as coconut, palm kernel, or palm oil.  Seasoning and other foods  Salted popcorn and pretzels. Onion salt, garlic salt, seasoned salt, table salt, and sea salt. Worcestershire sauce. Tartar sauce. Barbecue sauce. Teriyaki sauce. Soy sauce, including reduced-sodium. Steak sauce. Canned and packaged gravies. Fish sauce. Oyster sauce. Cocktail sauce. Horseradish that you find on the shelf. Ketchup. Mustard. Meat flavorings and tenderizers. Bouillon cubes. Hot sauce and Tabasco sauce. Premade or packaged marinades. Premade or packaged taco seasonings. Relishes. Regular salad dressings.  Where to find more information:   National Heart, Lung, and Blood Institute: www.nhlbi.nih.gov   American Heart Association: www.heart.org  Summary   The DASH eating plan is a healthy eating plan that has been shown to reduce high blood pressure (hypertension). It may also reduce your risk for type 2 diabetes, heart disease, and stroke.   With the   DASH eating plan, you should limit salt (sodium) intake to 2,300 mg a day. If you have hypertension, you may need to reduce your sodium intake to 1,500 mg a day.   When on the DASH eating plan, aim to eat more fresh fruits and vegetables, whole grains, lean proteins, low-fat dairy, and heart-healthy fats.   Work with your health care provider or diet and nutrition specialist (dietitian) to adjust your eating plan to your individual calorie needs.  This information is not intended to replace advice given to you by your health care provider. Make sure you discuss any questions you have with your health care provider.  Document Released: 08/01/2011 Document Revised: 08/05/2016 Document Reviewed: 08/05/2016  Elsevier Interactive Patient Education  2019 [employer]

## 2018-10-13 NOTE — Assessment & Plan Note (Signed)
Chronic, ongoing.  Continue current medication regimen.  Initial BP elevated today, but repeat below goal.  Annual labs today.

## 2018-10-13 NOTE — Assessment & Plan Note (Signed)
Chronic, ongoing.  Continue current medication regimen.  Minimal use of rescue inhaler.

## 2018-10-14 LAB — CBC WITH DIFFERENTIAL/PLATELET
Basophils Absolute: 0 10*3/uL (ref 0.0–0.2)
Basos: 1 %
EOS (ABSOLUTE): 0.1 10*3/uL (ref 0.0–0.4)
Eos: 2 %
HEMATOCRIT: 42.1 % (ref 37.5–51.0)
Hemoglobin: 13.5 g/dL (ref 13.0–17.7)
IMMATURE GRANS (ABS): 0 10*3/uL (ref 0.0–0.1)
Immature Granulocytes: 0 %
LYMPHS: 52 %
Lymphocytes Absolute: 1.3 10*3/uL (ref 0.7–3.1)
MCH: 26.2 pg — ABNORMAL LOW (ref 26.6–33.0)
MCHC: 32.1 g/dL (ref 31.5–35.7)
MCV: 82 fL (ref 79–97)
MONOCYTES: 13 %
Monocytes Absolute: 0.3 10*3/uL (ref 0.1–0.9)
NEUTROS ABS: 0.8 10*3/uL — AB (ref 1.4–7.0)
Neutrophils: 32 %
Platelets: 248 10*3/uL (ref 150–450)
RBC: 5.15 x10E6/uL (ref 4.14–5.80)
RDW: 13 % (ref 11.6–15.4)
WBC: 2.5 10*3/uL — CL (ref 3.4–10.8)

## 2018-10-14 LAB — COMPREHENSIVE METABOLIC PANEL
A/G RATIO: 1.4 (ref 1.2–2.2)
ALT: 29 IU/L (ref 0–44)
AST: 45 IU/L — ABNORMAL HIGH (ref 0–40)
Albumin: 4.1 g/dL (ref 3.8–4.8)
Alkaline Phosphatase: 68 IU/L (ref 39–117)
BUN/Creatinine Ratio: 17 (ref 10–24)
BUN: 17 mg/dL (ref 8–27)
Bilirubin Total: 0.6 mg/dL (ref 0.0–1.2)
CALCIUM: 9.6 mg/dL (ref 8.6–10.2)
CHLORIDE: 101 mmol/L (ref 96–106)
CO2: 26 mmol/L (ref 20–29)
Creatinine, Ser: 0.99 mg/dL (ref 0.76–1.27)
GFR calc Af Amer: 94 mL/min/{1.73_m2} (ref >59)
GFR calc non Af Amer: 81 mL/min/{1.73_m2} (ref >59)
GLOBULIN, TOTAL: 2.9 g/dL (ref 1.5–4.5)
Glucose: 78 mg/dL (ref 65–99)
Potassium: 5.1 mmol/L (ref 3.5–5.2)
Sodium: 143 mmol/L (ref 134–144)
TOTAL PROTEIN: 7 g/dL (ref 6.0–8.5)

## 2018-10-14 LAB — LIPID PANEL W/O CHOL/HDL RATIO
CHOLESTEROL TOTAL: 184 mg/dL (ref 100–199)
HDL: 47 mg/dL (ref >39)
LDL Calculated: 123 mg/dL — ABNORMAL HIGH (ref 0–99)
TRIGLYCERIDES: 72 mg/dL (ref 0–149)
VLDL CHOLESTEROL CAL: 14 mg/dL (ref 5–40)

## 2018-10-14 LAB — PSA: Prostate Specific Ag, Serum: 2.1 ng/mL (ref 0.0–4.0)

## 2018-10-14 LAB — TSH: TSH: 1.23 u[IU]/mL (ref 0.450–4.500)

## 2018-11-17 ENCOUNTER — Ambulatory Visit: Payer: Self-pay | Admitting: Nurse Practitioner

## 2018-11-28 ENCOUNTER — Other Ambulatory Visit: Payer: Self-pay | Admitting: Unknown Physician Specialty

## 2019-01-02 ENCOUNTER — Other Ambulatory Visit: Payer: Self-pay | Admitting: Unknown Physician Specialty

## 2019-01-02 NOTE — Telephone Encounter (Signed)
Please reveiw

## 2019-01-17 ENCOUNTER — Other Ambulatory Visit: Payer: Self-pay | Admitting: Nurse Practitioner

## 2019-04-13 ENCOUNTER — Other Ambulatory Visit: Payer: Self-pay

## 2019-04-13 ENCOUNTER — Encounter: Payer: Self-pay | Admitting: Nurse Practitioner

## 2019-04-13 ENCOUNTER — Ambulatory Visit (INDEPENDENT_AMBULATORY_CARE_PROVIDER_SITE_OTHER): Payer: BC Managed Care – PPO | Admitting: Nurse Practitioner

## 2019-04-13 VITALS — BP 128/87 | HR 89 | Temp 98.2°F

## 2019-04-13 DIAGNOSIS — I10 Essential (primary) hypertension: Secondary | ICD-10-CM | POA: Diagnosis not present

## 2019-04-13 DIAGNOSIS — D709 Neutropenia, unspecified: Secondary | ICD-10-CM | POA: Insufficient documentation

## 2019-04-13 NOTE — Assessment & Plan Note (Signed)
New onset.  Repeat CBC today and refer to hematology if ongoing low readings noted.

## 2019-04-13 NOTE — Assessment & Plan Note (Addendum)
Chronic, stable.  Continue current medication regimen and adjust as needed.  Recommend checking BP at home at least 3 mornings a week.  CMP today.

## 2019-04-13 NOTE — Progress Notes (Signed)
BP 128/87   Pulse 89   Temp 98.2 F (36.8 C) (Oral)   SpO2 96%    Subjective:    Patient ID: David Quinn, male    DOB: 16-Jun-1956, 63 y.o.   MRN: [id number]  HPI: David Quinn is a 63 y.o. male  Chief Complaint  Patient presents with  . Hypertension    6 month f/up   HYPERTENSION Continues on Lisinopril daily. Hypertension status: controlled  Satisfied with current treatment? yes Duration of hypertension: chronic BP monitoring frequency:  not checking BP range:  BP medication side effects:  no Medication compliance: good compliance Aspirin: yes Recurrent headaches: no Visual changes: no Palpitations: no Dyspnea: no Chest pain: no Lower extremity edema: no Dizzy/lightheaded: no   LEUKOPENIA: Had low WBC 2.5 and neutrophil 0.8 on February labs, was supposed to come in for repeat labs in March but did not.  Denies any fever, facial swelling, or recent URI symptoms.  Reports overall feeling well.  No history of radiation exposure.  No recent abx use.  Denies exposures at workplace.  On review of labs has had a gradual downward trend on WBC over past two years.  Discussed obtaining repeat labs today and if continued low levels referral to hematology for further assessment, which he agrees with.   Relevant past medical, surgical, family and social history reviewed and updated as indicated. Interim medical history since our last visit reviewed. Allergies and medications reviewed and updated.  Review of Systems  Constitutional: Negative for activity change, diaphoresis, fatigue and fever.  Respiratory: Negative for cough, chest tightness, shortness of breath and wheezing.   Cardiovascular: Negative for chest pain, palpitations and leg swelling.  Gastrointestinal: Negative for abdominal distention, abdominal pain, constipation, diarrhea, nausea and vomiting.  Neurological: Negative for dizziness, syncope, weakness, light-headedness, numbness and headaches.   Psychiatric/Behavioral: Negative.     Per HPI unless specifically indicated above     Objective:    BP 128/87   Pulse 89   Temp 98.2 F (36.8 C) (Oral)   SpO2 96%   Wt Readings from Last 3 Encounters:  10/13/18 154 lb 8 oz (70.1 kg)  04/07/18 164 lb 6.4 oz (74.6 kg)  10/30/17 168 lb 14.4 oz (76.6 kg)    Physical Exam Vitals signs and nursing note reviewed.  Constitutional:      General: He is awake. He is not in acute distress.    Appearance: He is well-developed. He is not ill-appearing.  HENT:     Head: Normocephalic and atraumatic.     Right Ear: Hearing normal. No drainage.     Left Ear: Hearing normal. No drainage.  Eyes:     General: Lids are normal.        Right eye: No discharge.        Left eye: No discharge.     Conjunctiva/sclera: Conjunctivae normal.     Pupils: Pupils are equal, round, and reactive to light.  Neck:     Musculoskeletal: Normal range of motion and neck supple.     Thyroid: No thyromegaly.     Vascular: No carotid bruit.  Cardiovascular:     Rate and Rhythm: Normal rate and regular rhythm.     Heart sounds: Normal heart sounds, S1 normal and S2 normal. No murmur. No gallop.   Pulmonary:     Effort: Pulmonary effort is normal. No accessory muscle usage or respiratory distress.     Breath sounds: Normal breath sounds.  Abdominal:  General: Bowel sounds are normal.     Palpations: Abdomen is soft.  Musculoskeletal: Normal range of motion.     Right lower leg: No edema.     Left lower leg: No edema.  Lymphadenopathy:     Head:     Right side of head: No submental, submandibular, tonsillar, preauricular or posterior auricular adenopathy.     Left side of head: No submental, submandibular, tonsillar, preauricular or posterior auricular adenopathy.     Cervical: No cervical adenopathy.  Skin:    General: Skin is warm and dry.  Neurological:     Mental Status: He is alert and oriented to person, place, and time.  Psychiatric:         Mood and Affect: Mood normal.        Behavior: Behavior normal. Behavior is cooperative.        Thought Content: Thought content normal.        Judgment: Judgment normal.     Results for orders placed or performed in visit on 10/13/18  CBC with Differential/Platelet  Result Value Ref Range   WBC 2.5 (LL) 3.4 - 10.8 x10E3/uL   RBC 5.15 4.14 - 5.80 x10E6/uL   Hemoglobin 13.5 13.0 - 17.7 g/dL   Hematocrit 21.342.1 08.637.5 - 51.0 %   MCV 82 79 - 97 fL   MCH 26.2 (L) 26.6 - 33.0 pg   MCHC 32.1 31.5 - 35.7 g/dL   RDW 57.813.0 46.911.6 - 62.915.4 %   Platelets 248 150 - 450 x10E3/uL   Neutrophils 32 Not Estab. %   Lymphs 52 Not Estab. %   Monocytes 13 Not Estab. %   Eos 2 Not Estab. %   Basos 1 Not Estab. %   Neutrophils Absolute 0.8 (L) 1.4 - 7.0 x10E3/uL   Lymphocytes Absolute 1.3 0.7 - 3.1 x10E3/uL   Monocytes Absolute 0.3 0.1 - 0.9 x10E3/uL   EOS (ABSOLUTE) 0.1 0.0 - 0.4 x10E3/uL   Basophils Absolute 0.0 0.0 - 0.2 x10E3/uL   Immature Granulocytes 0 Not Estab. %   Immature Grans (Abs) 0.0 0.0 - 0.1 x10E3/uL   Hematology Comments: Note:   Comprehensive metabolic panel  Result Value Ref Range   Glucose 78 65 - 99 mg/dL   BUN 17 8 - 27 mg/dL   Creatinine, Ser 5.280.99 0.76 - 1.27 mg/dL   GFR calc non Af Amer 81 >59 mL/min/1.73   GFR calc Af Amer 94 >59 mL/min/1.73   BUN/Creatinine Ratio 17 10 - 24   Sodium 143 134 - 144 mmol/L   Potassium 5.1 3.5 - 5.2 mmol/L   Chloride 101 96 - 106 mmol/L   CO2 26 20 - 29 mmol/L   Calcium 9.6 8.6 - 10.2 mg/dL   Total Protein 7.0 6.0 - 8.5 g/dL   Albumin 4.1 3.8 - 4.8 g/dL   Globulin, Total 2.9 1.5 - 4.5 g/dL   Albumin/Globulin Ratio 1.4 1.2 - 2.2   Bilirubin Total 0.6 0.0 - 1.2 mg/dL   Alkaline Phosphatase 68 39 - 117 IU/L   AST 45 (H) 0 - 40 IU/L   ALT 29 0 - 44 IU/L  Lipid Panel w/o Chol/HDL Ratio  Result Value Ref Range   Cholesterol, Total 184 100 - 199 mg/dL   Triglycerides 72 0 - 149 mg/dL   HDL 47 >41>39 mg/dL   VLDL Cholesterol Cal 14 5 - 40 mg/dL    LDL Calculated 324123 (H) 0 - 99 mg/dL  PSA  Result Value Ref Range  Prostate Specific Ag, Serum 2.1 0.0 - 4.0 ng/mL  TSH  Result Value Ref Range   TSH 1.230 0.450 - 4.500 uIU/mL      Assessment & Plan:   Problem List Items Addressed This Visit      Cardiovascular and Mediastinum   Hypertension - Primary    Chronic, stable.  Continue current medication regimen and adjust as needed.  Recommend checking BP at home at least 3 mornings a week.  CMP today.       Relevant Orders   Comprehensive metabolic panel     Other   Neutropenia (HCC)    New onset.  Repeat CBC today and refer to hematology if ongoing low readings noted.      Relevant Orders   CBC with Differential/Platelet       Follow up plan: Return in about 6 months (around 10/14/2019) for Annual physical.

## 2019-04-13 NOTE — Patient Instructions (Signed)

## 2019-04-14 LAB — COMPREHENSIVE METABOLIC PANEL WITH GFR
ALT: 32 IU/L (ref 0–44)
AST: 34 IU/L (ref 0–40)
Albumin/Globulin Ratio: 1.4 (ref 1.2–2.2)
Albumin: 4.2 g/dL (ref 3.8–4.8)
Alkaline Phosphatase: 79 IU/L (ref 39–117)
BUN/Creatinine Ratio: 14 (ref 10–24)
BUN: 16 mg/dL (ref 8–27)
Bilirubin Total: 0.5 mg/dL (ref 0.0–1.2)
CO2: 25 mmol/L (ref 20–29)
Calcium: 9.4 mg/dL (ref 8.6–10.2)
Chloride: 100 mmol/L (ref 96–106)
Creatinine, Ser: 1.11 mg/dL (ref 0.76–1.27)
GFR calc Af Amer: 82 mL/min/1.73 (ref >59)
GFR calc non Af Amer: 71 mL/min/1.73 (ref >59)
Globulin, Total: 3.1 g/dL (ref 1.5–4.5)
Glucose: 80 mg/dL (ref 65–99)
Potassium: 4.8 mmol/L (ref 3.5–5.2)
Sodium: 139 mmol/L (ref 134–144)
Total Protein: 7.3 g/dL (ref 6.0–8.5)

## 2019-04-14 LAB — CBC WITH DIFFERENTIAL/PLATELET
Basophils Absolute: 0 10*3/uL (ref 0.0–0.2)
Basos: 1 %
EOS (ABSOLUTE): 0.5 10*3/uL — ABNORMAL HIGH (ref 0.0–0.4)
Eos: 10 %
Hematocrit: 43.9 % (ref 37.5–51.0)
Hemoglobin: 14 g/dL (ref 13.0–17.7)
Immature Grans (Abs): 0 10*3/uL (ref 0.0–0.1)
Immature Granulocytes: 0 %
Lymphocytes Absolute: 1.5 10*3/uL (ref 0.7–3.1)
Lymphs: 32 %
MCH: 26.6 pg (ref 26.6–33.0)
MCHC: 31.9 g/dL (ref 31.5–35.7)
MCV: 83 fL (ref 79–97)
Monocytes Absolute: 0.6 10*3/uL (ref 0.1–0.9)
Monocytes: 13 %
Neutrophils Absolute: 2 10*3/uL (ref 1.4–7.0)
Neutrophils: 44 %
Platelets: 256 10*3/uL (ref 150–450)
RBC: 5.27 x10E6/uL (ref 4.14–5.80)
RDW: 13.1 % (ref 11.6–15.4)
WBC: 4.6 10*3/uL (ref 3.4–10.8)

## 2019-07-18 ENCOUNTER — Other Ambulatory Visit: Payer: Self-pay | Admitting: Nurse Practitioner

## 2019-10-13 ENCOUNTER — Other Ambulatory Visit: Payer: Self-pay | Admitting: Nurse Practitioner

## 2019-10-13 NOTE — Telephone Encounter (Signed)
Routing to provider. See message below from the [facility].  °

## 2019-10-18 ENCOUNTER — Encounter: Payer: BC Managed Care – PPO | Admitting: Nurse Practitioner

## 2019-11-19 ENCOUNTER — Other Ambulatory Visit: Payer: Self-pay

## 2019-11-19 ENCOUNTER — Ambulatory Visit (INDEPENDENT_AMBULATORY_CARE_PROVIDER_SITE_OTHER): Payer: 59 | Admitting: Nurse Practitioner

## 2019-11-19 ENCOUNTER — Encounter: Payer: Self-pay | Admitting: Nurse Practitioner

## 2019-11-19 VITALS — BP 133/86 | HR 90 | Temp 99.4°F | Ht 65.9 in | Wt 168.6 lb

## 2019-11-19 DIAGNOSIS — E7800 Pure hypercholesterolemia, unspecified: Secondary | ICD-10-CM | POA: Diagnosis not present

## 2019-11-19 DIAGNOSIS — D709 Neutropenia, unspecified: Secondary | ICD-10-CM

## 2019-11-19 DIAGNOSIS — I10 Essential (primary) hypertension: Secondary | ICD-10-CM

## 2019-11-19 DIAGNOSIS — J4540 Moderate persistent asthma, uncomplicated: Secondary | ICD-10-CM

## 2019-11-19 DIAGNOSIS — Z0000 Encounter for general adult medical examination without abnormal findings: Secondary | ICD-10-CM | POA: Diagnosis not present

## 2019-11-19 NOTE — Assessment & Plan Note (Signed)
Chronic, stable. Continue current medication regimen and adjust as needed.  Discussed with patient,  if increase in Lisinopril needed will switch to Losartan due to underlying asthma, would avoid increasing ACE.  He agrees with this plan.  Recommend he check BP at least a few days a week at home and document.  Obtain CMP today.

## 2019-11-19 NOTE — Assessment & Plan Note (Signed)
Noted on labs last year with improvement on recheck.  Check CBC today.  No B symptoms.  Consider hematology referral if ongoing.

## 2019-11-19 NOTE — Progress Notes (Signed)
BP 133/86   Pulse 90   Temp 99.4 F (37.4 C) (Oral)   Ht 5' 5.9" (1.674 m)   Wt 168 lb 9.6 oz (76.5 kg)   SpO2 96%   BMI 27.30 kg/m    Subjective:    Patient ID: David Quinn, male    DOB: December 07, 1955, 64 y.o.   MRN: [id number]  HPI: David Quinn is a 64 y.o. male presenting on 11/19/2019 for comprehensive medical examination. Current medical complaints include:none  He currently lives with: wife Interim Problems from his last visit: no   HYPERTENSION Continues on Lisinopril 10 MG daily without ADR. Hypertension status: stable  Satisfied with current treatment? yes Duration of hypertension: chronic BP monitoring frequency:  not checking BP range:  BP medication side effects:  no Medication compliance: good compliance Aspirin: yes Recurrent headaches: no Visual changes: no Palpitations: no Dyspnea: no Chest pain: no Lower extremity edema: no Dizzy/lightheaded: no   ASTHMA Continues Advair BID and Proair.  Has history of low WBC in February 2020 -- 2.5 and neuts 0.8, but repeat in August 4.6 and neuts 2.0.  No B symptoms. Asthma status: stable Satisfied with current treatment?: yes Albuterol/rescue inhaler frequency: once a month Dyspnea frequency: none Wheezing frequency: none Cough frequency:  none Nocturnal symptom frequency:  Limitation of activity: no Current upper respiratory symptoms: no Triggers: weather changes Home peak flows: Last Spirometry: unknown Failed/intolerant to following asthma meds: unknown Asthma meds in past:  Aerochamber/spacer use: no Visits to ER or Urgent Care in past year: no Pneumovax: Not up to Date Influenza: refuses   HYPERLIPIDEMIA No current medications. Supplements: none Aspirin:  yes The 10-year ASCVD risk score Denman George DC Jr., et al., 2013) is: 15.9%   Values used to calculate the score:     Age: 75 years     Sex: Male     Is Non-Hispanic African American: Yes     Diabetic: No     Tobacco smoker: No      Systolic Blood Pressure: 133 mmHg     Is BP treated: Yes     HDL Cholesterol: 47 mg/dL     Total Cholesterol: 184 mg/dL Chest pain:  no Coronary artery disease:  no Family history CAD:  no Family history early CAD:  no  Functional Status Survey: Is the patient deaf or have difficulty hearing?: No Does the patient have difficulty seeing, even when wearing glasses/contacts?: No Does the patient have difficulty concentrating, remembering, or making decisions?: No Does the patient have difficulty walking or climbing stairs?: No Does the patient have difficulty dressing or bathing?: No Does the patient have difficulty doing errands alone such as visiting a doctor's office or shopping?: No  FALL RISK: Fall Risk  11/19/2019 10/13/2018 10/06/2015  Falls in the past year? 0 0 No  Number falls in past yr: 0 0 -  Injury with Fall? 0 0 -  Follow up Falls evaluation completed - -    Depression Screen Depression screen [facility] 2/9 11/19/2019 10/13/2018 10/08/2017 10/23/2016 10/06/2015  Decreased Interest 0 0 0 0 0  Down, Depressed, Hopeless 0 0 0 0 0  PHQ - 2 Score 0 0 0 0 0  Altered sleeping - - 0 - -  Tired, decreased energy - - 0 - -  Change in appetite - - 0 - -  Feeling bad or failure about yourself  - - 0 - -  Trouble concentrating - - 0 - -  Moving slowly or fidgety/restless - -  0 - -  Suicidal thoughts - - 0 - -  PHQ-9 Score - - 0 - -    Advanced Directives <no information>  Past Medical History:  Past Medical History:  Diagnosis Date  . Asthma   . Hypertension     Surgical History:  Past Surgical History:  Procedure Laterality Date  . JOINT REPLACEMENT Bilateral    hip replacement  . TRACHEOSTOMY      Medications:  Current Outpatient Medications on File Prior to Visit  Medication Sig  . aspirin 81 MG tablet Take 1 tablet (81 mg total) by mouth daily.  . cholecalciferol (VITAMIN D) 1000 UNITS tablet Take 1,000 Units by mouth daily.  . Fluticasone-Salmeterol (ADVAIR  DISKUS) 250-50 MCG/DOSE AEPB Inhale 1 puff into the lungs 2 (two) times daily.  Marland Kitchen. ibuprofen (ADVIL,MOTRIN) 600 MG tablet Take 600 mg by mouth every 6 (six) hours as needed. for pain  . lisinopril (ZESTRIL) 10 MG tablet TAKE 1 TABLET BY MOUTH EVERY DAY  . Multiple Vitamin (MULTIVITAMIN) tablet Take 1 tablet by mouth daily.  Marland Kitchen. PROAIR HFA 108 (90 Base) MCG/ACT inhaler TAKE 2 PUFFS BY MOUTH EVERY 6 HOURS AS NEEDED FOR WHEEZE OR SHORTNESS OF BREATH   No current facility-administered medications on file prior to visit.    Allergies:  No Known Allergies  Social History:  Social History   Socioeconomic History  . Marital status: Married    Spouse name: Not on file  . Number of children: Not on file  . Years of education: Not on file  . Highest education level: Not on file  Occupational History  . Not on file  Tobacco Use  . Smoking status: Never Smoker  . Smokeless tobacco: Never Used  Substance and Sexual Activity  . Alcohol use: No    Alcohol/week: 0.0 standard drinks  . Drug use: No  . Sexual activity: Yes  Other Topics Concern  . Not on file  Social History Narrative  . Not on file   Social Determinants of Health   Financial Resource Strain:   . Difficulty of Paying Living Expenses:   Food Insecurity:   . Worried About Programme researcher, broadcasting/film/videounning Out of Food in the Last Year:   . Baristaan Out of Food in the Last Year:   Transportation Needs:   . Freight forwarderLack of Transportation (Medical):   Marland Kitchen. Lack of Transportation (Non-Medical):   Physical Activity:   . Days of Exercise per Week:   . Minutes of Exercise per Session:   Stress:   . Feeling of Stress :   Social Connections:   . Frequency of Communication with Friends and Family:   . Frequency of Social Gatherings with Friends and Family:   . Attends Religious Services:   . Active Member of Clubs or Organizations:   . Attends BankerClub or Organization Meetings:   Marland Kitchen. Marital Status:   Intimate Partner Violence:   . Fear of Current or Ex-Partner:   .  Emotionally Abused:   Marland Kitchen. Physically Abused:   . Sexually Abused:    Social History   Tobacco Use  Smoking Status Never Smoker  Smokeless Tobacco Never Used   Social History   Substance and Sexual Activity  Alcohol Use No  . Alcohol/week: 0.0 standard drinks    Family History:  Family History  Problem Relation Age of Onset  . Asthma Mother   . Cancer Mother   . Hyperlipidemia Mother   . Hypertension Mother   . Hypertension Father   . Stroke  Father   . Cancer Brother        stomach  . Seizures Brother     Past medical history, surgical history, medications, allergies, family history and social history reviewed with patient today and changes made to appropriate areas of the chart.   Review of Systems - negative All other ROS negative except what is listed above and in the HPI.      Objective:    BP 133/86   Pulse 90   Temp 99.4 F (37.4 C) (Oral)   Ht 5' 5.9" (1.674 m)   Wt 168 lb 9.6 oz (76.5 kg)   SpO2 96%   BMI 27.30 kg/m   Wt Readings from Last 3 Encounters:  11/19/19 168 lb 9.6 oz (76.5 kg)  10/13/18 154 lb 8 oz (70.1 kg)  04/07/18 164 lb 6.4 oz (74.6 kg)    Physical Exam Vitals and nursing note reviewed.  Constitutional:      General: He is awake. He is not in acute distress.    Appearance: He is well-developed and well-groomed. He is not ill-appearing.  HENT:     Head: Normocephalic and atraumatic.     Right Ear: Hearing, tympanic membrane, ear canal and external ear normal. No drainage.     Left Ear: Hearing, tympanic membrane, ear canal and external ear normal. No drainage.     Nose: Nose normal.     Mouth/Throat:     Pharynx: Uvula midline.  Eyes:     General: Lids are normal.        Right eye: No discharge.        Left eye: No discharge.     Extraocular Movements: Extraocular movements intact.     Conjunctiva/sclera: Conjunctivae normal.     Pupils: Pupils are equal, round, and reactive to light.     Visual Fields: Right eye visual fields  normal and left eye visual fields normal.  Neck:     Thyroid: No thyromegaly.     Vascular: No carotid bruit or JVD.     Trachea: Trachea normal.  Cardiovascular:     Rate and Rhythm: Normal rate and regular rhythm.     Heart sounds: Normal heart sounds, S1 normal and S2 normal. No murmur. No gallop.   Pulmonary:     Effort: Pulmonary effort is normal. No accessory muscle usage or respiratory distress.     Breath sounds: Normal breath sounds.  Abdominal:     General: Bowel sounds are normal.     Palpations: Abdomen is soft. There is no hepatomegaly or splenomegaly.     Tenderness: There is no abdominal tenderness.  Genitourinary:    Comments: Deferred per patient request. Musculoskeletal:        General: Normal range of motion.     Cervical back: Normal range of motion and neck supple.     Right lower leg: No edema.     Left lower leg: No edema.  Lymphadenopathy:     Head:     Right side of head: No submental, submandibular, tonsillar, preauricular or posterior auricular adenopathy.     Left side of head: No submental, submandibular, tonsillar, preauricular or posterior auricular adenopathy.     Cervical: No cervical adenopathy.  Skin:    General: Skin is warm and dry.     Capillary Refill: Capillary refill takes less than 2 seconds.     Findings: No rash.  Neurological:     Mental Status: He is alert and oriented to person, place,  and time.     Cranial Nerves: Cranial nerves are intact.     Gait: Gait is intact.     Deep Tendon Reflexes: Reflexes are normal and symmetric.     Reflex Scores:      Brachioradialis reflexes are 2+ on the right side and 2+ on the left side.      Patellar reflexes are 2+ on the right side and 2+ on the left side. Psychiatric:        Attention and Perception: Attention normal.        Mood and Affect: Mood normal.        Speech: Speech normal.        Behavior: Behavior normal. Behavior is cooperative.        Thought Content: Thought content  normal.        Cognition and Memory: Cognition normal.        Judgment: Judgment normal.     Results for orders placed or performed in visit on 04/13/19  CBC with Differential/Platelet  Result Value Ref Range   WBC 4.6 3.4 - 10.8 x10E3/uL   RBC 5.27 4.14 - 5.80 x10E6/uL   Hemoglobin 14.0 13.0 - 17.7 g/dL   Hematocrit 29.4 76.5 - 51.0 %   MCV 83 79 - 97 fL   MCH 26.6 26.6 - 33.0 pg   MCHC 31.9 31.5 - 35.7 g/dL   RDW 46.5 03.5 - 46.5 %   Platelets 256 150 - 450 x10E3/uL   Neutrophils 44 Not Estab. %   Lymphs 32 Not Estab. %   Monocytes 13 Not Estab. %   Eos 10 Not Estab. %   Basos 1 Not Estab. %   Neutrophils Absolute 2.0 1.4 - 7.0 x10E3/uL   Lymphocytes Absolute 1.5 0.7 - 3.1 x10E3/uL   Monocytes Absolute 0.6 0.1 - 0.9 x10E3/uL   EOS (ABSOLUTE) 0.5 (H) 0.0 - 0.4 x10E3/uL   Basophils Absolute 0.0 0.0 - 0.2 x10E3/uL   Immature Granulocytes 0 Not Estab. %   Immature Grans (Abs) 0.0 0.0 - 0.1 x10E3/uL  Comprehensive metabolic panel  Result Value Ref Range   Glucose 80 65 - 99 mg/dL   BUN 16 8 - 27 mg/dL   Creatinine, Ser 6.81 0.76 - 1.27 mg/dL   GFR calc non Af Amer 71 >59 mL/min/1.73   GFR calc Af Amer 82 >59 mL/min/1.73   BUN/Creatinine Ratio 14 10 - 24   Sodium 139 134 - 144 mmol/L   Potassium 4.8 3.5 - 5.2 mmol/L   Chloride 100 96 - 106 mmol/L   CO2 25 20 - 29 mmol/L   Calcium 9.4 8.6 - 10.2 mg/dL   Total Protein 7.3 6.0 - 8.5 g/dL   Albumin 4.2 3.8 - 4.8 g/dL   Globulin, Total 3.1 1.5 - 4.5 g/dL   Albumin/Globulin Ratio 1.4 1.2 - 2.2   Bilirubin Total 0.5 0.0 - 1.2 mg/dL   Alkaline Phosphatase 79 39 - 117 IU/L   AST 34 0 - 40 IU/L   ALT 32 0 - 44 IU/L      Assessment & Plan:   Problem List Items Addressed This Visit      Cardiovascular and Mediastinum   Hypertension    Chronic, stable. Continue current medication regimen and adjust as needed.  Discussed with patient,  if increase in Lisinopril needed will switch to Losartan due to underlying asthma, would  avoid increasing ACE.  He agrees with this plan.  Recommend he check BP at least a few  days a week at home and document.  Obtain CMP today.        Relevant Orders   Comprehensive metabolic panel   TSH     Respiratory   Asthma, moderate persistent    Chronic, ongoing.  Continue current medication regimen.  Minimal use of rescue inhaler.  Adjust regimen as needed based on symptoms.      Relevant Orders   CBC with Differential/Platelet     Other   Hyperlipidemia    Chronic, ongoing.  No current medications.  Lipid panel today.  He has refused statin in past, discussed further with him today his current ASCVD 15.9% and risks.  Will check panel and further discuss and recommend.      Relevant Orders   Lipid Panel w/o Chol/HDL Ratio   Neutropenia ([place])    Noted on labs last year with improvement on recheck.  Check CBC today.  No B symptoms.  Consider hematology referral if ongoing.      Relevant Orders   CBC with Differential/Platelet    Other Visit Diagnoses    Annual physical exam    -  Primary   Check annual labs to include TSH, CBC, CMP, Lipid, PSA.   Relevant Orders   PSA      Discussed aspirin prophylaxis for myocardial infarction prevention and decision was made to continue ASA  LABORATORY TESTING:  Health maintenance labs ordered today as discussed above.   The natural history of prostate cancer and ongoing controversy regarding screening and potential treatment outcomes of prostate cancer has been discussed with the patient. The meaning of a false positive PSA and a false negative PSA has been discussed. He indicates understanding of the limitations of this screening test and wishes to proceed with screening PSA testing.   IMMUNIZATIONS:   - Tdap: Tetanus vaccination status reviewed: last tetanus booster within 10 years. - Influenza: Refused - Pneumovax: Not applicable - Prevnar: Not applicable - Zostavax vaccine: Refused  SCREENING: - Colonoscopy: Up to date   Discussed with patient purpose of the colonoscopy is to detect colon cancer at curable precancerous or early stages   - AAA Screening: Not applicable  -Hearing Test: Not applicable  -Spirometry: Refused   PATIENT COUNSELING:    Sexuality: Discussed sexually transmitted diseases, partner selection, use of condoms, avoidance of unintended pregnancy  and contraceptive alternatives.   Advised to avoid cigarette smoking.  I discussed with the patient that most people either abstain from alcohol or drink within safe limits (<=14/week and <=4 drinks/occasion for males, <=7/weeks and <= 3 drinks/occasion for females) and that the risk for alcohol disorders and other health effects rises proportionally with the number of drinks per week and how often a drinker exceeds daily limits.  Discussed cessation/primary prevention of drug use and availability of treatment for abuse.   Diet: Encouraged to adjust caloric intake to maintain  or achieve ideal body weight, to reduce intake of dietary saturated fat and total fat, to limit sodium intake by avoiding high sodium foods and not adding table salt, and to maintain adequate dietary potassium and calcium preferably from fresh fruits, vegetables, and low-fat dairy products.    stressed the importance of regular exercise  Injury prevention: Discussed safety belts, safety helmets, smoke detector, smoking near bedding or upholstery.   Dental health: Discussed importance of regular tooth brushing, flossing, and dental visits.   Follow up plan: NEXT PREVENTATIVE PHYSICAL DUE IN 1 YEAR. Return in about 6 months (around 05/21/2020) for HTN/HLD, Asthma.

## 2019-11-19 NOTE — Patient Instructions (Signed)
DASH Eating Plan DASH stands for "Dietary Approaches to Stop Hypertension." The DASH eating plan is a healthy eating plan that has been shown to reduce high blood pressure (hypertension). It may also reduce your risk for type 2 diabetes, heart disease, and stroke. The DASH eating plan may also help with weight loss. What are tips for following this plan?  General guidelines  Avoid eating more than 2,300 mg (milligrams) of salt (sodium) a day. If you have hypertension, you may need to reduce your sodium intake to 1,500 mg a day.  Limit alcohol intake to no more than 1 drink a day for nonpregnant women and 2 drinks a day for men. One drink equals 12 oz of beer, 5 oz of wine, or 1 oz of hard liquor.  Work with your health care provider to maintain a healthy body weight or to lose weight. Ask what an ideal weight is for you.  Get at least 30 minutes of exercise that causes your heart to beat faster (aerobic exercise) most days of the week. Activities may include walking, swimming, or biking.  Work with your health care provider or diet and nutrition specialist (dietitian) to adjust your eating plan to your individual calorie needs. Reading food labels   Check food labels for the amount of sodium per serving. Choose foods with less than 5 percent of the Daily Value of sodium. Generally, foods with less than 300 mg of sodium per serving fit into this eating plan.  To find whole grains, look for the word "whole" as the first word in the ingredient list. Shopping  Buy products labeled as "low-sodium" or "no salt added."  Buy fresh foods. Avoid canned foods and premade or frozen meals. Cooking  Avoid adding salt when cooking. Use salt-free seasonings or herbs instead of table salt or sea salt. Check with your health care provider or pharmacist before using salt substitutes.  Do not fry foods. Cook foods using healthy methods such as baking, boiling, grilling, and broiling instead.  Cook with  heart-healthy oils, such as olive, canola, soybean, or sunflower oil. Meal planning  Eat a balanced diet that includes: ? 5 or more servings of fruits and vegetables each day. At each meal, try to fill half of your plate with fruits and vegetables. ? Up to 6-8 servings of whole grains each day. ? Less than 6 oz of lean meat, poultry, or fish each day. A 3-oz serving of meat is about the same size as a deck of cards. One egg equals 1 oz. ? 2 servings of low-fat dairy each day. ? A serving of nuts, seeds, or beans 5 times each week. ? Heart-healthy fats. Healthy fats called Omega-3 fatty acids are found in foods such as flaxseeds and coldwater fish, like sardines, salmon, and mackerel.  Limit how much you eat of the following: ? Canned or prepackaged foods. ? Food that is high in trans fat, such as fried foods. ? Food that is high in saturated fat, such as fatty meat. ? Sweets, desserts, sugary drinks, and other foods with added sugar. ? Full-fat dairy products.  Do not salt foods before eating.  Try to eat at least 2 vegetarian meals each week.  Eat more home-cooked food and less restaurant, buffet, and fast food.  When eating at a restaurant, ask that your food be prepared with less salt or no salt, if possible. What foods are recommended? The items listed may not be a complete list. Talk with your dietitian about   what dietary choices are best for you. Grains Whole-grain or whole-wheat bread. Whole-grain or whole-wheat pasta. Brown rice. Oatmeal. Quinoa. Bulgur. Whole-grain and low-sodium cereals. Pita bread. Low-fat, low-sodium crackers. Whole-wheat flour tortillas. Vegetables Fresh or frozen vegetables (raw, steamed, roasted, or grilled). Low-sodium or reduced-sodium tomato and vegetable juice. Low-sodium or reduced-sodium tomato sauce and tomato paste. Low-sodium or reduced-sodium canned vegetables. Fruits All fresh, dried, or frozen fruit. Canned fruit in natural juice (without  added sugar). Meat and other protein foods Skinless chicken or turkey. Ground chicken or turkey. Pork with fat trimmed off. Fish and seafood. Egg whites. Dried beans, peas, or lentils. Unsalted nuts, nut butters, and seeds. Unsalted canned beans. Lean cuts of beef with fat trimmed off. Low-sodium, lean deli meat. Dairy Low-fat (1%) or fat-free (skim) milk. Fat-free, low-fat, or reduced-fat cheeses. Nonfat, low-sodium ricotta or cottage cheese. Low-fat or nonfat yogurt. Low-fat, low-sodium cheese. Fats and oils Soft margarine without trans fats. Vegetable oil. Low-fat, reduced-fat, or light mayonnaise and salad dressings (reduced-sodium). Canola, safflower, olive, soybean, and sunflower oils. Avocado. Seasoning and other foods Herbs. Spices. Seasoning mixes without salt. Unsalted popcorn and pretzels. Fat-free sweets. What foods are not recommended? The items listed may not be a complete list. Talk with your dietitian about what dietary choices are best for you. Grains Baked goods made with fat, such as croissants, muffins, or some breads. Dry pasta or rice meal packs. Vegetables Creamed or fried vegetables. Vegetables in a cheese sauce. Regular canned vegetables (not low-sodium or reduced-sodium). Regular canned tomato sauce and paste (not low-sodium or reduced-sodium). Regular tomato and vegetable juice (not low-sodium or reduced-sodium). Pickles. Olives. Fruits Canned fruit in a light or heavy syrup. Fried fruit. Fruit in cream or butter sauce. Meat and other protein foods Fatty cuts of meat. Ribs. Fried meat. Bacon. Sausage. Bologna and other processed lunch meats. Salami. Fatback. Hotdogs. Bratwurst. Salted nuts and seeds. Canned beans with added salt. Canned or smoked fish. Whole eggs or egg yolks. Chicken or turkey with skin. Dairy Whole or 2% milk, cream, and half-and-half. Whole or full-fat cream cheese. Whole-fat or sweetened yogurt. Full-fat cheese. Nondairy creamers. Whipped toppings.  Processed cheese and cheese spreads. Fats and oils Butter. Stick margarine. Lard. Shortening. Ghee. Bacon fat. Tropical oils, such as coconut, palm kernel, or palm oil. Seasoning and other foods Salted popcorn and pretzels. Onion salt, garlic salt, seasoned salt, table salt, and sea salt. Worcestershire sauce. Tartar sauce. Barbecue sauce. Teriyaki sauce. Soy sauce, including reduced-sodium. Steak sauce. Canned and packaged gravies. Fish sauce. Oyster sauce. Cocktail sauce. Horseradish that you find on the shelf. Ketchup. Mustard. Meat flavorings and tenderizers. Bouillon cubes. Hot sauce and Tabasco sauce. Premade or packaged marinades. Premade or packaged taco seasonings. Relishes. Regular salad dressings. Where to find more information:  National Heart, Lung, and Blood Institute: www.nhlbi.nih.gov  American Heart Association: www.heart.org Summary  The DASH eating plan is a healthy eating plan that has been shown to reduce high blood pressure (hypertension). It may also reduce your risk for type 2 diabetes, heart disease, and stroke.  With the DASH eating plan, you should limit salt (sodium) intake to 2,300 mg a day. If you have hypertension, you may need to reduce your sodium intake to 1,500 mg a day.  When on the DASH eating plan, aim to eat more fresh fruits and vegetables, whole grains, lean proteins, low-fat dairy, and heart-healthy fats.  Work with your health care provider or diet and nutrition specialist (dietitian) to adjust your eating plan to your   individual calorie needs. This information is not intended to replace advice given to you by your health care provider. Make sure you discuss any questions you have with your health care provider. Document Revised: 07/25/2017 Document Reviewed: 08/05/2016 Elsevier Patient Education  2020 Elsevier Inc.  

## 2019-11-19 NOTE — Assessment & Plan Note (Signed)
Chronic, ongoing.  Continue current medication regimen.  Minimal use of rescue inhaler.  Adjust regimen as needed based on symptoms. 

## 2019-11-19 NOTE — Assessment & Plan Note (Signed)
Chronic, ongoing.  No current medications.  Lipid panel today.  He has refused statin in past, discussed further with him today his current ASCVD 15.9% and risks.  Will check panel and further discuss and recommend.

## 2019-11-20 LAB — TSH: TSH: 0.86 u[IU]/mL (ref 0.450–4.500)

## 2019-11-20 LAB — COMPREHENSIVE METABOLIC PANEL
ALT: 29 IU/L (ref 0–44)
AST: 29 IU/L (ref 0–40)
Albumin/Globulin Ratio: 1.5 (ref 1.2–2.2)
Albumin: 4.2 g/dL (ref 3.8–4.8)
Alkaline Phosphatase: 84 IU/L (ref 39–117)
BUN/Creatinine Ratio: 13 (ref 10–24)
BUN: 14 mg/dL (ref 8–27)
Bilirubin Total: 0.5 mg/dL (ref 0.0–1.2)
CO2: 24 mmol/L (ref 20–29)
Calcium: 9.4 mg/dL (ref 8.6–10.2)
Chloride: 103 mmol/L (ref 96–106)
Creatinine, Ser: 1.05 mg/dL (ref 0.76–1.27)
GFR calc Af Amer: 87 mL/min/{1.73_m2} (ref >59)
GFR calc non Af Amer: 75 mL/min/{1.73_m2} (ref >59)
Globulin, Total: 2.8 g/dL (ref 1.5–4.5)
Glucose: 86 mg/dL (ref 65–99)
Potassium: 4.5 mmol/L (ref 3.5–5.2)
Sodium: 140 mmol/L (ref 134–144)
Total Protein: 7 g/dL (ref 6.0–8.5)

## 2019-11-20 LAB — CBC WITH DIFFERENTIAL/PLATELET
Basophils Absolute: 0 10*3/uL (ref 0.0–0.2)
Basos: 1 %
EOS (ABSOLUTE): 0.2 10*3/uL (ref 0.0–0.4)
Eos: 7 %
Hematocrit: 43.6 % (ref 37.5–51.0)
Hemoglobin: 14.1 g/dL (ref 13.0–17.7)
Immature Grans (Abs): 0 10*3/uL (ref 0.0–0.1)
Immature Granulocytes: 0 %
Lymphocytes Absolute: 1 10*3/uL (ref 0.7–3.1)
Lymphs: 31 %
MCH: 26.8 pg (ref 26.6–33.0)
MCHC: 32.3 g/dL (ref 31.5–35.7)
MCV: 83 fL (ref 79–97)
Monocytes Absolute: 0.5 10*3/uL (ref 0.1–0.9)
Monocytes: 14 %
Neutrophils Absolute: 1.6 10*3/uL (ref 1.4–7.0)
Neutrophils: 47 %
Platelets: 272 10*3/uL (ref 150–450)
RBC: 5.26 x10E6/uL (ref 4.14–5.80)
RDW: 13.1 % (ref 11.6–15.4)
WBC: 3.3 10*3/uL — ABNORMAL LOW (ref 3.4–10.8)

## 2019-11-20 LAB — LIPID PANEL W/O CHOL/HDL RATIO
Cholesterol, Total: 217 mg/dL — ABNORMAL HIGH (ref 100–199)
HDL: 64 mg/dL (ref >39)
LDL Chol Calc (NIH): 146 mg/dL — ABNORMAL HIGH (ref 0–99)
Triglycerides: 39 mg/dL (ref 0–149)
VLDL Cholesterol Cal: 7 mg/dL (ref 5–40)

## 2019-11-20 LAB — PSA: Prostate Specific Ag, Serum: 2.4 ng/mL (ref 0.0–4.0)

## 2019-11-20 NOTE — Progress Notes (Signed)
Good morning, please let Mr. David Quinn know his labs have returned.  Everything returned normal with exception of: - White blood cell count is mildly on low side again, not as low as before but on low side. A normal low being 3.4 and his result was 3.3.  I would like him to make lab visit only in 4 weeks to recheck this. - His cholesterol remains elevated and risk score for cardiac event high, I would recommend starting a statin even if we start at low dose, to help with prevention.  Would he like to try this?  If so I can send it in and then would also like to see him in office in 4 weeks for follow-up to ensure tolerance of medication and labs.  Let me know. Thank you.  Have a great day!!

## 2019-12-13 ENCOUNTER — Telehealth: Payer: Self-pay | Admitting: Nurse Practitioner

## 2019-12-13 NOTE — Telephone Encounter (Signed)
Pt is her stating the insurance change and would like to change his pharmacy to  [facility] in [place] [address].

## 2019-12-13 NOTE — Telephone Encounter (Signed)
Pharmacy updated in patient's chart.

## 2020-01-10 ENCOUNTER — Other Ambulatory Visit: Payer: Self-pay | Admitting: Nurse Practitioner

## 2020-01-17 ENCOUNTER — Other Ambulatory Visit: Payer: Self-pay | Admitting: Nurse Practitioner

## 2020-01-17 MED ORDER — LISINOPRIL 10 MG PO TABS: 10.0000 mg | ORAL_TABLET | Freq: Every day | ORAL | 1 refills | Status: DC

## 2020-01-17 NOTE — Telephone Encounter (Signed)
Requested medication (s) are due for refill today: Yes  Requested medication (s) are on the active medication list: Yes  Last refill:  11/28/18  Future visit scheduled: Yes  Notes to clinic:  Expired Rx, unable to refill per protocol     Requested Prescriptions  Pending Prescriptions Disp Refills   albuterol (PROAIR HFA) 108 (90 Base) MCG/ACT inhaler        Pulmonology:  Beta Agonists Failed - 01/17/2020  5:40 PM      Failed - One inhaler should last at least one month. If the patient is requesting refills earlier, contact the patient to check for uncontrolled symptoms.      Passed - Valid encounter within last 12 months    Recent Outpatient Visits           1 month ago Annual physical exam   [facility] Cannady, Dorie Rank, NP   9 months ago Essential hypertension   [facility] [place], Dorie Rank, NP   1 year ago Annual physical exam   [facility] Marjie Skiff, NP   1 year ago Pure hypercholesterolemia   [facility] Osvaldo Angst M, PA-C   2 years ago Cervical pain (neck)   [facility] [place], Megan P, DO       Future Appointments             In 4 months Cannady, Dorie Rank, NP [facility]             Signed Prescriptions Disp Refills   lisinopril (ZESTRIL) 10 MG tablet 90 tablet 1    Sig: Take 1 tablet (10 mg total) by mouth daily.      Cardiovascular:  ACE Inhibitors Passed - 01/17/2020  5:40 PM      Passed - Cr in normal range and within 180 days    Creatinine, Ser  Date Value Ref Range Status  11/19/2019 1.05 0.76 - 1.27 mg/dL Final          Passed - K in normal range and within 180 days    Potassium  Date Value Ref Range Status  11/19/2019 4.5 3.5 - 5.2 mmol/L Final          Passed - Patient is not pregnant      Passed - Last BP in normal range    BP Readings from Last 1 Encounters:  11/19/19 133/86          Passed - Valid encounter within last 6 months     Recent Outpatient Visits           1 month ago Annual physical exam   [facility] [place], Dorie Rank, NP   9 months ago Essential hypertension   [facility] [place], Salona T, NP   1 year ago Annual physical exam   [facility] [place], Dorie Rank, NP   1 year ago Pure hypercholesterolemia   [facility] Trey Sailors, PA-C   2 years ago Cervical pain (neck)   [facility] [place], Megan P, DO       Future Appointments             In 4 months Cannady, Dorie Rank, NP [facility]

## 2020-01-17 NOTE — Telephone Encounter (Signed)
Pt called saying he needs a refill on the Lisinopril and his  albuterol inhaler.  He has changed pharmacy  His insurance has changed so he had to change his pharmacy.  He is using [facility] in [place].  CB#  [phone]

## 2020-01-18 MED ORDER — ALBUTEROL SULFATE HFA 108 (90 BASE) MCG/ACT IN AERS: 2.0000 | INHALATION_SPRAY | Freq: Four times a day (QID) | RESPIRATORY_TRACT | 2 refills | Status: DC | PRN

## 2020-05-25 ENCOUNTER — Telehealth: Payer: Self-pay | Admitting: Nurse Practitioner

## 2020-05-25 NOTE — Telephone Encounter (Signed)
Called to r/s 10/1 appt, no answer, left vm 

## 2020-05-26 ENCOUNTER — Ambulatory Visit: Payer: BC Managed Care – PPO | Admitting: Nurse Practitioner

## 2020-06-07 ENCOUNTER — Other Ambulatory Visit: Payer: Self-pay

## 2020-06-07 ENCOUNTER — Ambulatory Visit: Payer: BC Managed Care – PPO | Admitting: Nurse Practitioner

## 2020-06-07 ENCOUNTER — Encounter: Payer: Self-pay | Admitting: Nurse Practitioner

## 2020-06-07 VITALS — BP 124/84 | HR 71 | Temp 97.8°F | Wt 166.8 lb

## 2020-06-07 DIAGNOSIS — D709 Neutropenia, unspecified: Secondary | ICD-10-CM

## 2020-06-07 DIAGNOSIS — I10 Essential (primary) hypertension: Secondary | ICD-10-CM

## 2020-06-07 DIAGNOSIS — E7800 Pure hypercholesterolemia, unspecified: Secondary | ICD-10-CM | POA: Diagnosis not present

## 2020-06-07 DIAGNOSIS — J4540 Moderate persistent asthma, uncomplicated: Secondary | ICD-10-CM

## 2020-06-07 NOTE — Patient Instructions (Signed)
Neutropenia Neutropenia is a condition that occurs when you have a lower-than-normal level of a type of white blood cell (neutrophil) in your body. Neutrophils are made in the spongy center of large bones (bone marrow), and they fight infections. Neutrophils are your body's main defense against bacterial and fungal infections. The fewer neutrophils you have and the longer your body remains without them, the greater your risk of getting a severe infection. What are the causes? This condition can occur if your body uses up or destroys neutrophils faster than your bone marrow can make them. Neutropenia may be caused by:  A bacterial or fungal infection.  Allergic disorders.  Reactions to some medicines.  An autoimmune disease.  An enlarged spleen. This condition can also occur if your bone marrow does not produce enough neutrophils. This problem may be caused by:  Cancer.  Cancer treatments, such as radiation or chemotherapy.  Viral infections.  Medicines, such as phenytoin.  Vitamin B12 deficiency.  Diseases of the bone marrow.  Environmental toxins, such as insecticides. What are the signs or symptoms? This condition does not usually cause symptoms. If symptoms are present, they are usually caused by an underlying infection. Symptoms of an infection may include:  Fever.  Chills.  Swollen glands.  Oral or anal ulcers.  Cough and shortness of breath.  Rash.  Skin infection.  Fatigue. How is this diagnosed? Your health care provider may suspect neutropenia if you have:  A condition that may cause neutropenia.  Symptoms during or after treatment for cancer.  Symptoms of infection, especially fever.  Frequent and unusual infections. This condition is diagnosed based on your medical history and a physical exam. Tests will also be done, such as:  A complete blood count (CBC).  A procedure to collect a sample of bone marrow for examination (bone marrow  biopsy).  A chest X-ray.  A urine culture.  A blood culture. How is this treated? Treatment depends on the underlying cause and severity of your condition. Mild neutropenia may not require treatment. Treatment may include medicines, such as:  Antibiotic medicine given through an IV.  Antiviral medicines.  Antifungal medicines.  A medicine to increase neutrophil production (colony-stimulating factor). You may get this drug through an IV or by injection.  Steroids given through an IV. If an underlying condition is causing neutropenia, you may need treatment for that condition. If medicines or cancer treatments are causing neutropenia, your health care provider may have you stop the medicines or treatment. Follow these instructions at home: Medicines   Take over-the-counter and prescription medicines only as told by your health care provider.  Get a seasonal flu shot (influenza vaccine).  Avoid people who received a vaccine in the past 30 days if that vaccine contained a live version of the germ (live vaccine). You should not get a live vaccine. Common live vaccines are polio, MMR, chicken pox, and shingles vaccines. Eating and drinking  Do not share food utensils.  Do not eat unpasteurized foods.  Do not eat raw or undercooked meat, eggs, or seafood.  Do not eat unwashed, raw fruits or vegetables. Lifestyle  Avoid exposure to groups of people or children.  Avoid being around people who are sick.  Avoid being around dirt or dust, such as in construction areas or gardens.  Do not provide direct care for pets. Avoid animal droppings. Do not clean litter boxes and bird cages.  Do not have sex unless your health care provider has approved. Hygiene  Bathe daily.  Clean the area between the genitals and the anus (perineal area) after you urinate or have a bowel movement. If you are male, wipe from front to back.  Brush your teeth with a soft toothbrush before and  after meals.  Do not use a regular razor. Use an electric razor to remove hair.  Wash your hands often. Make sure others who come in contact with you also wash their hands. If soap and water are not available, use hand sanitizer. General instructions  Follow any precautions as told by your health care provider to reduce your risk for injury or infection.  Take actions to avoid cuts and burns. For example: ? Be cautious when you use knives. Always cut away from yourself. ? Keep knives in protective sheaths or guards when not in use. ? Use oven mitts when you cook with a hot stove, oven, or grill. ? Stand a safe distance away from open fires.  Do not use tampons, enemas, or rectal suppositories unless your health care provider has approved.  Keep all follow-up visits as told by your health care provider. This is important. Contact a health care provider if:  You have: ? A sore throat. ? A warm, red, or tender area on your skin. ? A cough. ? Frequent or painful urination. ? Vaginal discharge or itching.  You develop: ? Sores in your mouth or anus. ? Swollen lymph nodes. ? Red streaks on the skin. ? A rash. Get help right away if:  You have: ? A fever. ? Chills, or you start to shake.  You feel: ? Nauseous, or you vomit. ? Very fatigued. ? Short of breath. Summary  Neutropenia is a condition that occurs when you have a lower-than-normal level of a type of white blood cell (neutrophil) in your body.  This condition can occur if your body uses up or destroys neutrophils faster than your bone marrow can make them.  Treatment depends on the underlying cause and severity of your condition. Mild neutropenia may not require treatment.  Follow any precautions as told by your health care provider to reduce your risk for injury or infection. This information is not intended to replace advice given to you by your health care provider. Make sure you discuss any questions you have  with your health care provider. Document Revised: 05/28/2018 Document Reviewed: 05/28/2018 Elsevier Patient Education  [place].

## 2020-06-07 NOTE — Assessment & Plan Note (Signed)
Chronic, stable with BP at goal. Continue current medication regimen and adjust as needed.  Discussed with patient,  if increase in Lisinopril needed will switch to Losartan due to underlying asthma, would avoid increasing ACE.  He agrees with this plan.  Recommend he check BP at least a few days a week at home and document.  Obtain BMP today.

## 2020-06-07 NOTE — Assessment & Plan Note (Signed)
Chronic, ongoing.  Continue current medication regimen.  Minimal use of rescue inhaler.  Adjust regimen as needed based on symptoms.

## 2020-06-07 NOTE — Assessment & Plan Note (Signed)
Chronic, ongoing.  No current medications.  Lipid panel today.  He has refused statin in past, discussed further with him today his current ASCVD 13.5% and risks.  Will check panel and further discuss and recommend.

## 2020-06-07 NOTE — Assessment & Plan Note (Signed)
Noted on labs last year with improvement on recheck.  Check CBC, CRP, and ESR today.  No B symptoms.  Consider hematology referral if ongoing.

## 2020-06-07 NOTE — Progress Notes (Signed)
BP 124/84 (BP Location: Left Arm, Patient Position: Sitting, Cuff Size: Normal)   Pulse 71   Temp 97.8 F (36.6 C) (Oral)   Wt 166 lb 12.8 oz (75.7 kg)   BMI 27.00 kg/m    Subjective:    Patient ID: David Quinn, male    DOB: 16-Oct-1955, 64 y.o.   MRN: [id number]  HPI: David Quinn is a 64 y.o. male  Chief Complaint  Patient presents with  . Hypertension   HYPERTENSION Continues on Lisinopril daily and ASA. Hypertension status: controlled  Satisfied with current treatment? yes Duration of hypertension: chronic BP monitoring frequency:  not checking BP range:  BP medication side effects:  no Medication compliance: good compliance Aspirin: yes Recurrent headaches: no Visual changes: no Palpitations: no Dyspnea: no Chest pain: no Lower extremity edema: no Dizzy/lightheaded: no  The 10-year ASCVD risk score David Bussing DC Jr., David al., David Quinn) is: 13.5%   Values used to calculate the score:     Age: 78 years     Sex: Male     Is Non-Hispanic African American: Yes     Diabetic: No     Tobacco smoker: No     Systolic Blood Pressure: 409 mmHg     Is BP treated: Yes     HDL Cholesterol: 64 mg/dL     Total Cholesterol: 217 mg/dL  LEUKOPENIA: Had low WBC 2.5 and neutrophil 0.8 -- recent labs in March WBC 3.3 and neutrophil 1.6.  Denies any fever, facial swelling, or recent URI symptoms.  Reports overall feeling well.  No history of radiation exposure.  No recent abx use.  Denies exposures at workplace.  In 2017 HIV and Hep C negative.  Denies any joint aches or pains.  No family history of similar.  On review of labs has had a gradual downward trend on WBC over past two years.  Discussed obtaining repeat labs today and if continued low levels referral to hematology for further assessment, which he agrees with.  ASTHMA Continues Advair BID and Proair.   Asthma status:stable Satisfied with current treatment?:yes Albuterol/rescue inhaler frequency:once a  month Dyspnea frequency:none Wheezing frequency:none Cough frequency:none Nocturnal symptom frequency:  Limitation of activity:no Current upper respiratory symptoms:no Triggers: weather changes Home peak flows: Last Spirometry: unknown Failed/intolerant to following asthma meds: unknown Asthma meds in past:  Aerochamber/spacer use:no Visits to ER or Urgent Care in past year:no Pneumovax:Not up to Date Influenza:refuses  Relevant past medical, surgical, family and social history reviewed and updated as indicated. Interim medical history since our last visit reviewed. Allergies and medications reviewed and updated.  Review of Systems  Constitutional: Negative for activity change, diaphoresis, fatigue and fever.  Respiratory: Negative for cough, chest tightness, shortness of breath and wheezing.   Cardiovascular: Negative for chest pain, palpitations and leg swelling.  Gastrointestinal: Negative for abdominal distention, abdominal pain, constipation, diarrhea, nausea and vomiting.  Neurological: Negative for dizziness, syncope, weakness, light-headedness, numbness and headaches.  Psychiatric/Behavioral: Negative.     Per HPI unless specifically indicated above     Objective:    BP 124/84 (BP Location: Left Arm, Patient Position: Sitting, Cuff Size: Normal)   Pulse 71   Temp 97.8 F (36.6 C) (Oral)   Wt 166 lb 12.8 oz (75.7 kg)   BMI 27.00 kg/m   Wt Readings from Last 3 Encounters:  06/07/20 166 lb 12.8 oz (75.7 kg)  11/19/19 168 lb 9.6 oz (76.5 kg)  10/13/18 154 lb 8 oz (70.1 kg)  Physical Exam Vitals and nursing note reviewed.  Constitutional:      General: He is awake. He is not in acute distress.    Appearance: He is well-developed. He is not ill-appearing.  HENT:     Head: Normocephalic and atraumatic.     Right Ear: Hearing normal. No drainage.     Left Ear: Hearing normal. No drainage.  Eyes:     General: Lids are normal.        Right eye: No  discharge.        Left eye: No discharge.     Conjunctiva/sclera: Conjunctivae normal.     Pupils: Pupils are equal, round, and reactive to light.  Neck:     Thyroid: No thyromegaly.     Vascular: No carotid bruit.  Cardiovascular:     Rate and Rhythm: Normal rate and regular rhythm.     Heart sounds: Normal heart sounds, S1 normal and S2 normal. No murmur heard.  No gallop.   Pulmonary:     Effort: Pulmonary effort is normal. No accessory muscle usage or respiratory distress.     Breath sounds: Normal breath sounds.  Abdominal:     General: Bowel sounds are normal.     Palpations: Abdomen is soft.  Musculoskeletal:        General: Normal range of motion.     Cervical back: Normal range of motion and neck supple.     Right lower leg: No edema.     Left lower leg: No edema.  Lymphadenopathy:     Head:     Right side of head: No submental, submandibular, tonsillar, preauricular or posterior auricular adenopathy.     Left side of head: No submental, submandibular, tonsillar, preauricular or posterior auricular adenopathy.     Cervical: No cervical adenopathy.  Skin:    General: Skin is warm and dry.  Neurological:     Mental Status: He is alert and oriented to person, place, and time.  Psychiatric:        Mood and Affect: Mood normal.        Behavior: Behavior normal. Behavior is cooperative.        Thought Content: Thought content normal.        Judgment: Judgment normal.    Results for orders placed or performed in visit on 11/19/19  CBC with Differential/Platelet  Result Value Ref Range   WBC 3.3 (L) 3.4 - 10.8 x10E3/uL   RBC 5.26 4.14 - 5.80 x10E6/uL   Hemoglobin 14.1 13.0 - 17.7 g/dL   Hematocrit 43.6 37.5 - 51.0 %   MCV 83 79 - 97 fL   MCH 26.8 26.6 - 33.0 pg   MCHC 32.3 31 - 35 g/dL   RDW 13.1 11.6 - 15.4 %   Platelets 272 150 - 450 x10E3/uL   Neutrophils 47 Not Estab. %   Lymphs 31 Not Estab. %   Monocytes 14 Not Estab. %   Eos 7 Not Estab. %   Basos 1 Not  Estab. %   Neutrophils Absolute 1.6 1 - 7 x10E3/uL   Lymphocytes Absolute 1.0 0 - 3 x10E3/uL   Monocytes Absolute 0.5 0 - 0 x10E3/uL   EOS (ABSOLUTE) 0.2 0.0 - 0.4 x10E3/uL   Basophils Absolute 0.0 0 - 0 x10E3/uL   Immature Granulocytes 0 Not Estab. %   Immature Grans (Abs) 0.0 0.0 - 0.1 x10E3/uL  Comprehensive metabolic panel  Result Value Ref Range   Glucose 86 65 - 99 mg/dL  BUN 14 8 - 27 mg/dL   Creatinine, Ser 1.05 0.76 - 1.27 mg/dL   GFR calc non Af Amer 75 >59 mL/min/1.73   GFR calc Af Amer 87 >59 mL/min/1.73   BUN/Creatinine Ratio 13 10 - 24   Sodium 140 134 - 144 mmol/L   Potassium 4.5 3.5 - 5.2 mmol/L   Chloride 103 96 - 106 mmol/L   CO2 24 20 - 29 mmol/L   Calcium 9.4 8.6 - 10.2 mg/dL   Total Protein 7.0 6.0 - 8.5 g/dL   Albumin 4.2 3.8 - 4.8 g/dL   Globulin, Total 2.8 1.5 - 4.5 g/dL   Albumin/Globulin Ratio 1.5 1.2 - 2.2   Bilirubin Total 0.5 0.0 - 1.2 mg/dL   Alkaline Phosphatase 84 39 - 117 IU/L   AST 29 0 - 40 IU/L   ALT 29 0 - 44 IU/L  PSA  Result Value Ref Range   Prostate Specific Ag, Serum 2.4 0.0 - 4.0 ng/mL  TSH  Result Value Ref Range   TSH 0.860 0.450 - 4.500 uIU/mL  Lipid Panel w/o Chol/HDL Ratio  Result Value Ref Range   Cholesterol, Total 217 (H) 100 - 199 mg/dL   Triglycerides 39 0 - 149 mg/dL   HDL 64 >39 mg/dL   VLDL Cholesterol Cal 7 5 - 40 mg/dL   LDL Chol Calc (NIH) 146 (H) 0 - 99 mg/dL      Assessment & Plan:   Problem List Items Addressed This Visit      Cardiovascular and Mediastinum   Hypertension    Chronic, stable with BP at goal. Continue current medication regimen and adjust as needed.  Discussed with patient,  if increase in Lisinopril needed will switch to Losartan due to underlying asthma, would avoid increasing ACE.  He agrees with this plan.  Recommend he check BP at least a few days a week at home and document.  Obtain BMP today.        Relevant Orders   Basic metabolic panel     Respiratory   Asthma, moderate  persistent    Chronic, ongoing.  Continue current medication regimen.  Minimal use of rescue inhaler.  Adjust regimen as needed based on symptoms.        Other   Hyperlipidemia    Chronic, ongoing.  No current medications.  Lipid panel today.  He has refused statin in past, discussed further with him today his current ASCVD 13.5% and risks.  Will check panel and further discuss and recommend.      Relevant Orders   Lipid Panel w/o Chol/HDL Ratio   Neutropenia ([place]) - Primary    Noted on labs last year with improvement on recheck.  Check CBC, CRP, and ESR today.  No B symptoms.  Consider hematology referral if ongoing.      Relevant Orders   CBC with Differential/Platelet   C-reactive protein   Sed Rate (ESR)       Follow up plan: Return in about 6 months (around 12/06/2020) for Annual physical -- [employer] patient.

## 2020-06-08 ENCOUNTER — Other Ambulatory Visit: Payer: Self-pay | Admitting: Nurse Practitioner

## 2020-06-08 DIAGNOSIS — R700 Elevated erythrocyte sedimentation rate: Secondary | ICD-10-CM

## 2020-06-08 DIAGNOSIS — D709 Neutropenia, unspecified: Secondary | ICD-10-CM

## 2020-06-08 LAB — CBC WITH DIFFERENTIAL/PLATELET
Basophils Absolute: 0 10*3/uL (ref 0.0–0.2)
Basos: 1 %
EOS (ABSOLUTE): 0.4 10*3/uL (ref 0.0–0.4)
Eos: 12 %
Hematocrit: 42.7 % (ref 37.5–51.0)
Hemoglobin: 14.2 g/dL (ref 13.0–17.7)
Immature Grans (Abs): 0 10*3/uL (ref 0.0–0.1)
Immature Granulocytes: 0 %
Lymphocytes Absolute: 1.1 10*3/uL (ref 0.7–3.1)
Lymphs: 33 %
MCH: 26.8 pg (ref 26.6–33.0)
MCHC: 33.3 g/dL (ref 31.5–35.7)
MCV: 81 fL (ref 79–97)
Monocytes Absolute: 0.5 10*3/uL (ref 0.1–0.9)
Monocytes: 14 %
Neutrophils Absolute: 1.4 10*3/uL (ref 1.4–7.0)
Neutrophils: 40 %
Platelets: 279 10*3/uL (ref 150–450)
RBC: 5.29 x10E6/uL (ref 4.14–5.80)
RDW: 12.3 % (ref 11.6–15.4)
WBC: 3.3 10*3/uL — ABNORMAL LOW (ref 3.4–10.8)

## 2020-06-08 LAB — BASIC METABOLIC PANEL
BUN/Creatinine Ratio: 16 (ref 10–24)
BUN: 15 mg/dL (ref 8–27)
CO2: 26 mmol/L (ref 20–29)
Calcium: 9.4 mg/dL (ref 8.6–10.2)
Chloride: 100 mmol/L (ref 96–106)
Creatinine, Ser: 0.92 mg/dL (ref 0.76–1.27)
GFR calc Af Amer: 102 mL/min/{1.73_m2} (ref >59)
GFR calc non Af Amer: 88 mL/min/{1.73_m2} (ref >59)
Glucose: 72 mg/dL (ref 65–99)
Potassium: 4.5 mmol/L (ref 3.5–5.2)
Sodium: 137 mmol/L (ref 134–144)

## 2020-06-08 LAB — SEDIMENTATION RATE: Sed Rate: 39 mm/hr — ABNORMAL HIGH (ref 0–30)

## 2020-06-08 LAB — LIPID PANEL W/O CHOL/HDL RATIO
Cholesterol, Total: 230 mg/dL — ABNORMAL HIGH (ref 100–199)
HDL: 66 mg/dL (ref >39)
LDL Chol Calc (NIH): 153 mg/dL — ABNORMAL HIGH (ref 0–99)
Triglycerides: 66 mg/dL (ref 0–149)
VLDL Cholesterol Cal: 11 mg/dL (ref 5–40)

## 2020-06-08 LAB — C-REACTIVE PROTEIN: CRP: 7 mg/L (ref 0–10)

## 2020-06-08 NOTE — Progress Notes (Signed)
Good morning, please let Viraj know his labs have returned.  Cholesterol levels continue to be elevated, but continue to recommend focus on diet and regular exercise.  Kidney function is normal.  White blood cell count continues to be mildly low and one of your inflammatory tests is mildly elevated. I would like you to come in for lab draw only in 6 weeks to recheck these and determine next steps if needed.  Please schedule this lab visit.  Any questions?  Have a great day!! Keep being awesome!!  Thank you for allowing me to participate in your care. Kindest regards, Teffany Blaszczyk

## 2020-07-28 ENCOUNTER — Inpatient Hospital Stay
Admission: EM | Admit: 2020-07-28 | Discharge: 2020-08-02 | DRG: 519 | Disposition: A | Discharge status: Home Health | Payer: BC Managed Care – PPO | Attending: Internal Medicine | Admitting: Internal Medicine

## 2020-07-28 ENCOUNTER — Emergency Department: Payer: BC Managed Care – PPO

## 2020-07-28 ENCOUNTER — Other Ambulatory Visit: Payer: Self-pay

## 2020-07-28 ENCOUNTER — Encounter: Payer: Self-pay | Admitting: Emergency Medicine

## 2020-07-28 DIAGNOSIS — M5416 Radiculopathy, lumbar region: Secondary | ICD-10-CM

## 2020-07-28 DIAGNOSIS — M5459 Other low back pain: Secondary | ICD-10-CM | POA: Diagnosis not present

## 2020-07-28 DIAGNOSIS — M5126 Other intervertebral disc displacement, lumbar region: Secondary | ICD-10-CM | POA: Diagnosis not present

## 2020-07-28 DIAGNOSIS — Z8249 Family history of ischemic heart disease and other diseases of the circulatory system: Secondary | ICD-10-CM | POA: Diagnosis not present

## 2020-07-28 DIAGNOSIS — E875 Hyperkalemia: Secondary | ICD-10-CM

## 2020-07-28 DIAGNOSIS — R29898 Other symptoms and signs involving the musculoskeletal system: Secondary | ICD-10-CM | POA: Diagnosis not present

## 2020-07-28 DIAGNOSIS — M48061 Spinal stenosis, lumbar region without neurogenic claudication: Secondary | ICD-10-CM | POA: Diagnosis not present

## 2020-07-28 DIAGNOSIS — Z7982 Long term (current) use of aspirin: Secondary | ICD-10-CM | POA: Diagnosis not present

## 2020-07-28 DIAGNOSIS — D72829 Elevated white blood cell count, unspecified: Secondary | ICD-10-CM | POA: Diagnosis not present

## 2020-07-28 DIAGNOSIS — N179 Acute kidney failure, unspecified: Secondary | ICD-10-CM | POA: Diagnosis not present

## 2020-07-28 DIAGNOSIS — R531 Weakness: Secondary | ICD-10-CM | POA: Diagnosis not present

## 2020-07-28 DIAGNOSIS — I10 Essential (primary) hypertension: Secondary | ICD-10-CM | POA: Diagnosis present

## 2020-07-28 DIAGNOSIS — Z4889 Encounter for other specified surgical aftercare: Secondary | ICD-10-CM | POA: Diagnosis not present

## 2020-07-28 DIAGNOSIS — Z20822 Contact with and (suspected) exposure to covid-19: Secondary | ICD-10-CM | POA: Diagnosis not present

## 2020-07-28 DIAGNOSIS — M6281 Muscle weakness (generalized): Secondary | ICD-10-CM | POA: Diagnosis not present

## 2020-07-28 DIAGNOSIS — M25552 Pain in left hip: Secondary | ICD-10-CM | POA: Diagnosis not present

## 2020-07-28 DIAGNOSIS — E785 Hyperlipidemia, unspecified: Secondary | ICD-10-CM | POA: Diagnosis present

## 2020-07-28 DIAGNOSIS — Z79899 Other long term (current) drug therapy: Secondary | ICD-10-CM

## 2020-07-28 DIAGNOSIS — D72828 Other elevated white blood cell count: Secondary | ICD-10-CM | POA: Diagnosis not present

## 2020-07-28 DIAGNOSIS — M1712 Unilateral primary osteoarthritis, left knee: Secondary | ICD-10-CM | POA: Diagnosis present

## 2020-07-28 DIAGNOSIS — J4540 Moderate persistent asthma, uncomplicated: Secondary | ICD-10-CM | POA: Diagnosis not present

## 2020-07-28 DIAGNOSIS — E860 Dehydration: Secondary | ICD-10-CM | POA: Diagnosis not present

## 2020-07-28 DIAGNOSIS — R29818 Other symptoms and signs involving the nervous system: Secondary | ICD-10-CM | POA: Diagnosis not present

## 2020-07-28 DIAGNOSIS — M5116 Intervertebral disc disorders with radiculopathy, lumbar region: Principal | ICD-10-CM

## 2020-07-28 DIAGNOSIS — Z96643 Presence of artificial hip joint, bilateral: Secondary | ICD-10-CM | POA: Diagnosis not present

## 2020-07-28 DIAGNOSIS — R200 Anesthesia of skin: Secondary | ICD-10-CM

## 2020-07-28 DIAGNOSIS — Z23 Encounter for immunization: Secondary | ICD-10-CM | POA: Diagnosis not present

## 2020-07-28 DIAGNOSIS — M4726 Other spondylosis with radiculopathy, lumbar region: Secondary | ICD-10-CM | POA: Diagnosis not present

## 2020-07-28 DIAGNOSIS — Z7951 Long term (current) use of inhaled steroids: Secondary | ICD-10-CM

## 2020-07-28 DIAGNOSIS — R52 Pain, unspecified: Secondary | ICD-10-CM | POA: Diagnosis not present

## 2020-07-28 DIAGNOSIS — Z83438 Family history of other disorder of lipoprotein metabolism and other lipidemia: Secondary | ICD-10-CM | POA: Diagnosis not present

## 2020-07-28 DIAGNOSIS — Z825 Family history of asthma and other chronic lower respiratory diseases: Secondary | ICD-10-CM | POA: Diagnosis not present

## 2020-07-28 DIAGNOSIS — Z981 Arthrodesis status: Secondary | ICD-10-CM | POA: Diagnosis not present

## 2020-07-28 DIAGNOSIS — M545 Low back pain, unspecified: Secondary | ICD-10-CM

## 2020-07-28 DIAGNOSIS — M19072 Primary osteoarthritis, left ankle and foot: Secondary | ICD-10-CM | POA: Diagnosis not present

## 2020-07-28 DIAGNOSIS — D709 Neutropenia, unspecified: Secondary | ICD-10-CM | POA: Diagnosis not present

## 2020-07-28 LAB — CBC
HCT: 45.9 % (ref 39.0–52.0)
Hemoglobin: 14.8 g/dL (ref 13.0–17.0)
MCH: 26.7 pg (ref 26.0–34.0)
MCHC: 32.2 g/dL (ref 30.0–36.0)
MCV: 82.9 fL (ref 80.0–100.0)
Platelets: 341 10*3/uL (ref 150–400)
RBC: 5.54 MIL/uL (ref 4.22–5.81)
RDW: 13.2 % (ref 11.5–15.5)
WBC: 6.4 10*3/uL (ref 4.0–10.5)
nRBC: 0 % (ref 0.0–0.2)

## 2020-07-28 LAB — RESP PANEL BY RT-PCR (FLU A&B, COVID) ARPGX2
Influenza A by PCR: NEGATIVE
Influenza B by PCR: NEGATIVE
SARS Coronavirus 2 by RT PCR: NEGATIVE

## 2020-07-28 LAB — COMPREHENSIVE METABOLIC PANEL
ALT: 23 U/L (ref 0–44)
AST: 37 U/L (ref 15–41)
Albumin: 3.9 g/dL (ref 3.5–5.0)
Alkaline Phosphatase: 73 U/L (ref 38–126)
Anion gap: 13 (ref 5–15)
BUN: 42 mg/dL — ABNORMAL HIGH (ref 8–23)
CO2: 24 mmol/L (ref 22–32)
Calcium: 9.3 mg/dL (ref 8.9–10.3)
Chloride: 101 mmol/L (ref 98–111)
Creatinine, Ser: 1.42 mg/dL — ABNORMAL HIGH (ref 0.61–1.24)
GFR, Estimated: 55 mL/min — ABNORMAL LOW (ref >60)
Glucose, Bld: 91 mg/dL (ref 70–99)
Potassium: 5.7 mmol/L — ABNORMAL HIGH (ref 3.5–5.1)
Sodium: 138 mmol/L (ref 135–145)
Total Bilirubin: 1 mg/dL (ref 0.3–1.2)
Total Protein: 8.5 g/dL — ABNORMAL HIGH (ref 6.5–8.1)

## 2020-07-28 MED ORDER — MOMETASONE FURO-FORMOTEROL FUM 200-5 MCG/ACT IN AERO
2.0000 | INHALATION_SPRAY | Freq: Two times a day (BID) | RESPIRATORY_TRACT | Status: DC
  Administered 2020-07-29 – 2020-08-02 (×7): 2 via RESPIRATORY_TRACT
  Filled 2020-07-28 (×2): qty 8.8

## 2020-07-28 MED ORDER — ACETAMINOPHEN 325 MG PO TABS: 650.0000 mg | ORAL_TABLET | Freq: Four times a day (QID) | ORAL | Status: DC | PRN

## 2020-07-28 MED ORDER — LABETALOL HCL 5 MG/ML IV SOLN: 20.0000 mg | INTRAVENOUS | Status: DC | PRN

## 2020-07-28 MED ORDER — INSULIN ASPART 100 UNIT/ML ~~LOC~~ SOLN
SUBCUTANEOUS | Status: AC
  Administered 2020-07-28: 5 [IU] via INTRAVENOUS
  Filled 2020-07-28: qty 1

## 2020-07-28 MED ORDER — MAGNESIUM HYDROXIDE 400 MG/5ML PO SUSP
30.0000 mL | Freq: Every day | ORAL | Status: DC | PRN
  Administered 2020-07-31: 30 mL via ORAL
  Filled 2020-07-28: qty 30

## 2020-07-28 MED ORDER — DEXAMETHASONE SODIUM PHOSPHATE 10 MG/ML IJ SOLN
10.0000 mg | Freq: Four times a day (QID) | INTRAMUSCULAR | Status: DC
  Administered 2020-07-29 – 2020-08-02 (×17): 10 mg via INTRAVENOUS
  Filled 2020-07-28 (×20): qty 1

## 2020-07-28 MED ORDER — MORPHINE SULFATE (PF) 2 MG/ML IV SOLN: 2.0000 mg | INTRAVENOUS | Status: DC | PRN

## 2020-07-28 MED ORDER — ADULT MULTIVITAMIN W/MINERALS CH
1.0000 | ORAL_TABLET | Freq: Every day | ORAL | Status: DC
  Administered 2020-07-30 – 2020-08-02 (×4): 1 via ORAL
  Filled 2020-07-28 (×5): qty 1

## 2020-07-28 MED ORDER — ONDANSETRON HCL 4 MG PO TABS: 4.0000 mg | ORAL_TABLET | Freq: Four times a day (QID) | ORAL | Status: DC | PRN

## 2020-07-28 MED ORDER — ONDANSETRON HCL 4 MG/2ML IJ SOLN: 4.0000 mg | Freq: Four times a day (QID) | INTRAMUSCULAR | Status: DC | PRN

## 2020-07-28 MED ORDER — VITAMIN D 25 MCG (1000 UNIT) PO TABS
1000.0000 [IU] | ORAL_TABLET | Freq: Every day | ORAL | Status: DC
  Administered 2020-07-30 – 2020-08-02 (×4): 1000 [IU] via ORAL
  Filled 2020-07-28 (×5): qty 1

## 2020-07-28 MED ORDER — SODIUM CHLORIDE 0.9 % IV SOLN: INTRAVENOUS | Status: DC

## 2020-07-28 MED ORDER — TRAZODONE HCL 50 MG PO TABS: 25.0000 mg | ORAL_TABLET | Freq: Every evening | ORAL | Status: DC | PRN

## 2020-07-28 MED ORDER — SODIUM BICARBONATE 8.4 % IV SOLN
50.0000 meq | Freq: Once | INTRAVENOUS | Status: AC
  Administered 2020-07-28: 50 meq via INTRAVENOUS
  Filled 2020-07-28: qty 50

## 2020-07-28 MED ORDER — LISINOPRIL 10 MG PO TABS: 10.0000 mg | ORAL_TABLET | Freq: Every day | ORAL | Status: DC

## 2020-07-28 MED ORDER — ACETAMINOPHEN 650 MG RE SUPP: 650.0000 mg | Freq: Four times a day (QID) | RECTAL | Status: DC | PRN

## 2020-07-28 MED ORDER — DEXAMETHASONE SODIUM PHOSPHATE 10 MG/ML IJ SOLN
10.0000 mg | Freq: Once | INTRAMUSCULAR | Status: AC
  Administered 2020-07-28: 10 mg via INTRAVENOUS
  Filled 2020-07-28: qty 1

## 2020-07-28 MED ORDER — DEXTROSE 50 % IV SOLN: 1.0000 | Freq: Once | INTRAVENOUS | Status: AC

## 2020-07-28 MED ORDER — DEXTROSE 50 % IV SOLN
INTRAVENOUS | Status: AC
  Administered 2020-07-28: 50 mL via INTRAVENOUS
  Filled 2020-07-28: qty 50

## 2020-07-28 MED ORDER — INSULIN ASPART 100 UNIT/ML ~~LOC~~ SOLN
5.0000 [IU] | Freq: Once | SUBCUTANEOUS | Status: AC
  Filled 2020-07-28: qty 1
  Filled 2020-07-28: qty 0.05

## 2020-07-28 MED ORDER — ALBUTEROL SULFATE HFA 108 (90 BASE) MCG/ACT IN AERS
2.0000 | INHALATION_SPRAY | Freq: Four times a day (QID) | RESPIRATORY_TRACT | Status: DC | PRN
  Filled 2020-07-28: qty 6.7

## 2020-07-28 MED ORDER — INSULIN REGULAR HUMAN 100 UNIT/ML IJ SOLN
5.0000 [IU] | Freq: Once | INTRAMUSCULAR | Status: DC
  Filled 2020-07-28: qty 3

## 2020-07-28 NOTE — ED Triage Notes (Signed)
Pt to ED via [facility] from work for hip pain. Pt states that he was at work last and started having left hip pain that has progressively gotten worse and went into his left knee. Pt took Aleve this morning and was able to get some relief but the pain has since gotten worse. VSS per EMS. Pt is in NAD, rates pain 10/10.

## 2020-07-28 NOTE — ED Notes (Signed)
Patient transported from MRI at this time via w/c- no acute distress noted.

## 2020-07-28 NOTE — ED Notes (Signed)
Hospitalist at bedside at this time speaking to patient and spouse

## 2020-07-28 NOTE — Consult Note (Signed)
Full note to follow.  64 yo male with acute back pain and left leg radicular symptoms consistent with L4 radiculopathy.  Pain improving but with numbness and some weakness.  MRI reviewed - L L3-4 disc extrusion with lateral recess stenosis, L L4-5 extraforaminal disc herniation  A/P) Acute L L4 radiculopathy due to L3-4 and L4-5 disc herniations as above  - Admit for pain control - Steroids Dexamethasone 10 mg now and 4 q6 - If not impovement, will discuss decompression with patient - Appreciate medicine comment on neutrapenia. - Please keep NPO post midnight  Venetia Night MD

## 2020-07-28 NOTE — ED Notes (Signed)
Report called to [place], RN

## 2020-07-28 NOTE — H&P (Addendum)
[place]   PATIENT NAME: David Quinn    MR#:  [id number]  DATE OF BIRTH:  [birth year]  DATE OF ADMISSION:  07/28/2020  PRIMARY CARE PHYSICIAN: Marjie Skiff, NP   REQUESTING/REFERRING PHYSICIAN: Enid Derry, PA-C  CHIEF COMPLAINT:   Chief Complaint  Patient presents with  . Knee Pain  . Hip Pain    HISTORY OF PRESENT ILLNESS:  David Quinn  is a 64 y.o. male with a known history of asthma, dyslipidemia, hypertension, as well as leukopenia with neutropenia, who presented to the emergency room with acute onset of worsening left lower back pain for the last 10 days with associated left leg pain and weakness that started today.  He was unable to tie his shoes due to significant pain at work today and later he was having difficulty lifting his left leg.  He was later unable to ambulate on it but he called EMS.  He admitted to numbness to the medial side of the left leg from the knee down to the ankle.  No urinary or stool incontinence.  No other paresthesias or focal muscle weakness.  No chest pain or dyspnea or palpitations.  No cough or wheezing hemoptysis.  He has been fully vaccinated against COVID-19 with mother no vaccine.  His asthma has been fairly controlled.  Upon presentation to the emergency room, vital signs were within normal.  Labs revealed CBC with WBC of 6.4 with otherwise normal values.  CMP revealed hyperkalemia 5.7 and a BUN of 42 with creatinine 1.42 compared to 15 and 0.92 on 06/07/2020.  Troponin was 8.5.  Left hip x-ray showed prior bilateral total hip arthroplasties with no evidence for hardware complication and no acute osseous abnormality.  It showed partial ankylosis across the SI joints.  Left tib-fib x-ray showed osteoarthritis of the left knee and ankle with no acute bony abnormalities.  Noncontrasted head CT scan revealed no acute intracranial normalities.-As C-spine MRI showed the following as mentioned below including left  subarticular/foraminal L3-L4 protrusion which contributes to moderate left subarticular stenosis, crowding the descending left L4 nerve root and contributing to moderate/severe left neural foraminal narrowing.  It also showed L4-L5 new left foraminal/extraforaminal disc protrusion that contributes to progressive moderate/severe left neuroforaminal narrowing that also contributes to mild left subarticular narrowing without nerve root impingement.  The case was discussed with Dr. Marcell Barlow who recommended 10 a gram of IV Decadron every 6 hours and keeping the patient n.p.o. after midnight.  The patient will be admitted to a medical bed for further evaluation and management. PAST MEDICAL HISTORY:   Past Medical History:  Diagnosis Date  . Asthma   . Hypertension   -Leukopenia with neutropenia -Dyslipidemia  PAST SURGICAL HISTORY:   Past Surgical History:  Procedure Laterality Date  . JOINT REPLACEMENT Bilateral    hip replacement  . TRACHEOSTOMY      SOCIAL HISTORY:   Social History   Tobacco Use  . Smoking status: Never Smoker  . Smokeless tobacco: Never Used  Substance Use Topics  . Alcohol use: No    Alcohol/week: 0.0 standard drinks    FAMILY HISTORY:   Family History  Problem Relation Age of Onset  . Asthma Mother   . Cancer Mother   . Hyperlipidemia Mother   . Hypertension Mother   . Hypertension Father   . Stroke Father   . Cancer Brother        stomach  . Seizures Brother     DRUG  ALLERGIES:  No Known Allergies  REVIEW OF SYSTEMS:   ROS As per history of present illness. All pertinent systems were reviewed above. Constitutional, HEENT, cardiovascular, respiratory, GI, GU, musculoskeletal, neuro, psychiatric, endocrine, integumentary and hematologic systems were reviewed and are otherwise negative/unremarkable except for positive findings mentioned above in the HPI.   MEDICATIONS AT HOME:   Prior to Admission medications   Medication Sig Start Date End  Date Taking? Authorizing Provider  albuterol (PROAIR HFA) 108 (90 Base) MCG/ACT inhaler Inhale 2 puffs into the lungs every 6 (six) hours as needed for wheezing or shortness of breath. 01/18/20   Cannady, Corrie Dandy T, NP  aspirin 81 MG tablet Take 1 tablet (81 mg total) by mouth daily. 04/22/17   Gabriel Cirri, NP  cholecalciferol (VITAMIN D) 1000 UNITS tablet Take 1,000 Units by mouth daily.    [provider]  Fluticasone-Salmeterol (ADVAIR DISKUS) 250-50 MCG/DOSE AEPB Inhale 1 puff into the lungs 2 (two) times daily. 10/13/19   Cannady, Corrie Dandy T, NP  ibuprofen (ADVIL,MOTRIN) 600 MG tablet Take 600 mg by mouth every 6 (six) hours as needed. for pain 10/07/18   [provider]  lisinopril (ZESTRIL) 10 MG tablet Take 1 tablet (10 mg total) by mouth daily. 01/17/20   Aura Dials T, NP  Multiple Vitamin (MULTIVITAMIN) tablet Take 1 tablet by mouth daily.    [provider]      VITAL SIGNS:  Blood pressure (!) 120/92, pulse 82, temperature 98 F (36.7 C), temperature source Oral, resp. rate 16, height 5\' 7"  (1.702 m), weight 74.8 kg, SpO2 100 %.  PHYSICAL EXAMINATION:  Physical Exam  GENERAL:  64 y.o.-year-old male patient lying in the bed with no acute distress.  EYES: Pupils equal, round, reactive to light and accommodation. No scleral icterus. Extraocular muscles intact.  HEENT: Head atraumatic, normocephalic. Oropharynx and nasopharynx clear.  NECK:  Supple, no jugular venous distention. No thyroid enlargement, no tenderness.  LUNGS: Normal breath sounds bilaterally, no wheezing, rales,rhonchi or crepitation. No use of accessory muscles of respiration.  CARDIOVASCULAR: Regular rate and rhythm, S1, S2 normal. No murmurs, rubs, or gallops.  ABDOMEN: Soft, nondistended, nontender. Bowel sounds present. No organomegaly or mass.  EXTREMITIES: No pedal edema, cyanosis, or clubbing.  NEUROLOGIC: Cranial nerves II through XII are intact. Muscle strength 2-3/5 in the left  thigh compared to the right thigh with flexion and extension of the hip.  Muscle strength was 5/5 in upper extremities and the right lower extremity.  He had diminished sensation to light touch in the medial part of the left leg from the knee down to his ankle.  Gait not checked. Musculoskeletal: No lumbar spine tenderness.  Decreased range of motion of the left hip and the knee compared to the right side. PSYCHIATRIC: The patient is alert and oriented x 3.  Normal affect and good eye contact. SKIN: No obvious rash, lesion, or ulcer.   LABORATORY PANEL:   CBC No results for input(s): WBC, HGB, HCT, PLT in the last 168 hours. ------------------------------------------------------------------------------------------------------------------  Chemistries  No results for input(s): NA, K, CL, CO2, GLUCOSE, BUN, CREATININE, CALCIUM, MG, AST, ALT, ALKPHOS, BILITOT in the last 168 hours.  Invalid input(s): GFRCGP ------------------------------------------------------------------------------------------------------------------  Cardiac Enzymes No results for input(s): TROPONINI in the last 168 hours. ------------------------------------------------------------------------------------------------------------------  RADIOLOGY:  DG Tibia/Fibula Left  Result Date: 07/28/2020 CLINICAL DATA:  Left leg pain EXAM: LEFT TIBIA AND FIBULA - 2 VIEW COMPARISON:  None. FINDINGS: Frontal and lateral views of the left tibia and  fibula demonstrate no fractures. Alignment is anatomic. Medial and lateral compartmental osteoarthritis within the left knee, with extensive chondrocalcinosis. Mild osteoarthritis of the tibiotalar joint. Soft tissues are unremarkable. IMPRESSION: 1. Osteoarthritis of the left knee and ankle. No acute bony abnormality. Electronically Signed   By: Sharlet Salina M.D.   On: 07/28/2020 18:55   CT Head Wo Contrast  Result Date: 07/28/2020 CLINICAL DATA:  Neuro deficit, stroke suspected EXAM: CT  HEAD WITHOUT CONTRAST TECHNIQUE: Contiguous axial images were obtained from the base of the skull through the vertex without intravenous contrast. COMPARISON:  None. FINDINGS: Brain: No evidence of acute infarction, hemorrhage, hydrocephalus, extra-axial collection, visible mass lesion or mass effect. Parenchymal volume is likely normal for age. Midline structures are normal. Basal cisterns are patent. Cerebellar tonsils are normally position. Vascular: Atherosclerotic calcification of the carotid siphons. No hyperdense vessel. Skull: No calvarial fracture or suspicious osseous lesion. No scalp swelling or hematoma. Sinuses/Orbits: Paranasal sinuses and mastoid air cells are predominantly clear. Included orbital structures are unremarkable. Other: None IMPRESSION: 1. No acute intracranial abnormality. If there is persisting clinical concern for infarct, MRI is more sensitive and specific for early changes of ischemia. Electronically Signed   By: Kreg Shropshire M.D.   On: 07/28/2020 18:24   MR LUMBAR SPINE WO CONTRAST  Result Date: 07/28/2020 CLINICAL DATA:  Low back pain, progressive neurologic deficit. Additional history provided: Patient reports hip pain, progressively worsening and radiating into left knee. EXAM: MRI LUMBAR SPINE WITHOUT CONTRAST TECHNIQUE: Multiplanar, multisequence MR imaging of the lumbar spine was performed. No intravenous contrast was administered. COMPARISON:  Lumbar spine MRI 01/04/2011 FINDINGS: Segmentation: For the purposes of this dictation, five lumbar vertebrae are assumed and the caudal most well-formed intervertebral disc is designated L5-S1. Alignment:  Mild L1-L2, L2-L3 and L5-S1 grade 1 retrolisthesis. Vertebrae: Vertebral body height is maintained. Moderate marrow edema is present within the left L4 and L5 pedicles and articular pillars as well as L4 spinous process. This edema is likely degenerative in the absence of any signs or symptoms of infection. Conus medullaris and  cauda equina: Conus extends to the T12-L1 level. No signal abnormality within the visualized distal spinal cord. Paraspinal and other soft tissues: Small T2 hyperintense renal lesions bilaterally, incompletely assessed but possibly reflecting cysts. The partially imaged urinary bladder is distended paraspinal soft tissues within normal limits. Disc levels: Unless otherwise stated, the level by level findings below have not significantly changed since prior MRI 01/04/2011. Progressive moderate L5-S1 disc degeneration. No more than mild disc degeneration at the remaining levels. T11-T12: This level is imaged sagittally. Small disc bulge. No significant spinal canal or foraminal stenosis. T12-L1: Small disc bulge. No significant spinal canal or foraminal stenosis. L1-L2: Disc bulge. Superimposed tiny right center disc protrusion. A previously demonstrated left center disc protrusion has regressed and is no longer appreciated. Mild facet arthrosis/ligamentum flavum hypertrophy. Mild bilateral subarticular and central canal narrowing without nerve root impingement. Mild bilateral neural foraminal narrowing, new from the prior exam. L2-L3: Posterior annular fissure. Disc bulge. Mild facet arthrosis/ligamentum flavum hypertrophy. Mild bilateral subarticular and mild/moderate central canal stenosis, not significantly changed. Bilateral neural foraminal narrowing, unchanged (mild right, moderate left). L3-L4: Progressive disc bulge. New superimposed left subarticular/foraminal disc extrusion. Progressive moderate facet arthrosis with ligamentum flavum hypertrophy. The disc protrusion contributes to moderate left subarticular stenosis, crowding the descending left L4 nerve root. Progressive mild right subarticular and central canal stenosis. Progressive moderate/severe bilateral neural foraminal narrowing. Additionally, there is extruded disc material posterior  to the L4 vertebral body on the left (series 5, image 9). It is  unclear if this extruded disc material is arising from the L3-L4 or L4-L5 discs and sequestered disc material cannot be excluded. L4-L5: Progressive disc bulge. New left foraminal/extraforaminal disc protrusion (series 8, image 23) (series 5, image 11). Progressive moderate facet arthrosis with ligamentum flavum hypertrophy. Trace left facet joint effusion. Progressive mild left subarticular narrowing without nerve root impingement. Progressive bilateral neural foraminal narrowing (mild right, moderate/severe left). L5-S1: Disc bulge. Redemonstrated broad-based disc extrusion spanning the central and subarticular zones with minimal caudal migration. Progressive moderate facet arthrosis and left greater than right ligamentum flavum hypertrophy. As before, the disc extrusion contributes to moderate/severe left subarticular stenosis, encroaching upon the descending left S1 nerve root. Mild right subarticular and central canal narrowing. No significant foraminal stenosis IMPRESSION: Lumbar spondylosis as outlined and having progressed at several levels since the prior MRI of 01/04/2011. Findings are most notably as follows. At L3-L4, there is a new left subarticular/foraminal disc extrusion which contributes to moderate left subarticular stenosis, crowding the descending left L4 nerve root. This also contributes to moderate/severe left neural foraminal narrowing. Multifactorial progressive moderate/severe right neural foraminal narrowing. Additionally, there is extruded disc material posterior to the L4 vertebral body on the left. It is unclear if this extruded disc material has arisen from the L3-L4 or L4-L5 discs and sequestered disc material cannot be excluded. At L4-L5, there is a new left foraminal/extraforaminal disc protrusion. This contributes to progressive moderate/severe left neural foraminal narrowing. It also contributes to mild left subarticular narrowing without nerve root impingement. At L5-S1,  redemonstrated broad-based central disc extrusion spanning the central and bilateral subarticular zones with minimal caudal migration. As before, the disc extrusion contributes to moderate/severe left subarticular stenosis, encroaching upon the descending left S1 nerve root. Also new from the prior MRI, there is moderate edema within the left L4 and L5 pedicles/articular pillars and L4 spinous process. This edema is likely degenerative in the absence of any signs or symptoms of infection. Trace left L3-L4 and L4-L5 facet joint effusions. Electronically Signed   By: Jackey Loge DO   On: 07/28/2020 16:54   DG Hip Unilat W or Wo Pelvis 2-3 Views Left  Result Date: 07/28/2020 CLINICAL DATA:  Worsening left hip pain now radiating to left knee EXAM: DG HIP (WITH OR WITHOUT PELVIS) 2-3V LEFT COMPARISON:  None. FINDINGS: Prior bilateral total hip arthroplasties with screw fixed acetabular components. Hardware is in expected alignment. Some heterotopic ossification is noted about the acetabula and proximal femoral sites which can be seen post surgically. No acute periprosthetic fracture or complication is evident. No evidence of hardware failure or loosening. Remaining bones of the pelvis appear intact and congruent with partial ankylosis across the bilateral SI joints. Arcuate lines are contiguous. Degenerative features are present in the lumbar spine. Vascular calcifications and phleboliths noted in the pelvis. Bowel gas pattern is unremarkable. IMPRESSION: 1. Prior bilateral total hip arthroplasties without evidence of hardware complication. 2. No acute osseous abnormality. 3. Partial ankylosis across the SI joints. Electronically Signed   By: Kreg Shropshire M.D.   On: 07/28/2020 18:58      IMPRESSION AND PLAN:   1.  L3-L4 and L4-L5 disc protrusion with associated left significant neuroforaminal narrowing and radiculopathy with subsequent left leg weakness and left lower back pain. -The patient admitted to  medical bed. -Pain management will be provided. -We will continue IV Decadron 10 mg every 6 hours. -Neurosurgery consultation  will be obtained. -Dr. Myer HaffYarbrough is aware about the patient.  2.  Acute kidney injury likely prerenal secondary to volume depletion and dehydration.  He has hyperkalemia likely secondary to his acute kidney injury and ACE inhibitor therapy. -The patient will be hydrated with IV normal saline. -We will follow BMP. -We will hold off his Zestril.  3.  Essential hypertension. -Zestril will be held off and the patient will be placed on as needed IV labetalol.  4.  Asthma. -We will continue his inhalers including as needed albuterol and scheduled Advair Diskus.  5.  Dyslipidemia. -We will check fasting lipids in a.m. -He may benefit from statin therapy.  6.  DVT prophylaxis. -SCDs. -Medical prophylaxis is postponed till postoperative.   All the records are reviewed and case discussed with ED provider. The plan of care was discussed in details with the patient (and family). I answered all questions. The patient agreed to proceed with the above mentioned plan. Further management will depend upon hospital course.   CODE STATUS: Full code  Status is: Inpatient  Remains inpatient appropriate because:Ongoing active pain requiring inpatient pain management, Ongoing diagnostic testing needed not appropriate for outpatient work up, Unsafe d/c plan, IV treatments appropriate due to intensity of illness or inability to take PO and Inpatient level of care appropriate due to severity of illness   Dispo: The patient is from: Home              Anticipated d/c is to: Home              Anticipated d/c date is: 3 days              Patient currently is not medically stable to d/c.   TOTAL TIME TAKING CARE OF THIS PATIENT: 55 minutes.    Hannah BeatJan A Amarionna Arca M.D on 07/28/2020 at 7:44 PM  [facility]   From 7 PM-7 AM, contact night-coverage [email or web address]  CC: Primary  care physician; Marjie Skiffannady, Jolene T, NP

## 2020-07-28 NOTE — ED Provider Notes (Signed)
[facility] Emergency Department Provider Note  ____________________________________________  Time seen: Approximately 6:23 PM  I have reviewed the triage vital signs and the nursing notes.   HISTORY  Chief Complaint Knee Pain and Hip Pain    HPI David MessierDaniel L Quinn is a 64 y.o. male that presents to the emergency department for evaluation of left-sided low back pain for 1.5 weeks and left leg weakness today.  Patient states that he has had increasing left low back pain over the last 1.5 weeks.  He is even had difficulty tying his shoes due to the pain.  He was at work today, when he began having difficulty lifting his left leg.  He soon, was not able to walk and called EMS.  He currently denies any pain.  He is primarily having numbness to the inside of his left shin and is unable to raise his left leg. He cannot walk. No bowel or bladder dysfunction or saddle anesthesias. No specific trauma.   Past Medical History:  Diagnosis Date  . Asthma   . Hypertension     Patient Active Problem List   Diagnosis Date Noted  . Lumbar disc prolapse with compression radiculopathy 07/28/2020  . Neutropenia (HCC) 04/13/2019  . Cervical pain (neck) 10/30/2017  . Hyperlipidemia 04/23/2016  . Hypertension 04/04/2015  . Asthma, moderate persistent 04/04/2015    Past Surgical History:  Procedure Laterality Date  . JOINT REPLACEMENT Bilateral    hip replacement  . TRACHEOSTOMY      Prior to Admission medications   Medication Sig Start Date End Date Taking? Authorizing Provider  albuterol (PROAIR HFA) 108 (90 Base) MCG/ACT inhaler Inhale 2 puffs into the lungs every 6 (six) hours as needed for wheezing or shortness of breath. 01/18/20  Yes Cannady, Jolene T, NP  aspirin 81 MG tablet Take 1 tablet (81 mg total) by mouth daily. 04/22/17  Yes Gabriel CirriWicker, Cheryl, NP  cholecalciferol (VITAMIN D) 1000 UNITS tablet Take 1,000 Units by mouth daily.   Yes [provider]   Fluticasone-Salmeterol (ADVAIR DISKUS) 250-50 MCG/DOSE AEPB Inhale 1 puff into the lungs 2 (two) times daily. 10/13/19  Yes Cannady, Jolene T, NP  lisinopril (ZESTRIL) 10 MG tablet Take 1 tablet (10 mg total) by mouth daily. 01/17/20  Yes Cannady, Corrie DandyJolene T, NP  Multiple Vitamin (MULTIVITAMIN) tablet Take 1 tablet by mouth daily.   Yes [provider]  ibuprofen (ADVIL,MOTRIN) 600 MG tablet Take 600 mg by mouth every 6 (six) hours as needed. for pain Patient not taking: Reported on 07/28/2020 10/07/18   [provider]    Allergies Patient has no known allergies.  Family History  Problem Relation Age of Onset  . Asthma Mother   . Cancer Mother   . Hyperlipidemia Mother   . Hypertension Mother   . Hypertension Father   . Stroke Father   . Cancer Brother        stomach  . Seizures Brother     Social History Social History   Tobacco Use  . Smoking status: Never Smoker  . Smokeless tobacco: Never Used  Vaping Use  . Vaping Use: Never used  Substance Use Topics  . Alcohol use: No    Alcohol/week: 0.0 standard drinks  . Drug use: No     Review of Systems  Cardiovascular: No chest pain. Respiratory: No SOB. Gastrointestinal: No abdominal pain.  No vomiting.  Musculoskeletal: Positive for low back pain. Skin: Negative for rash, abrasions, lacerations, ecchymosis. Neurological: Negative for headaches,  tingling.  Positive for numbness. Positive for weakness.    ____________________________________________   PHYSICAL EXAM:  VITAL SIGNS: ED Triage Vitals  Enc Vitals Group     BP 07/28/20 1225 110/87     Pulse Rate 07/28/20 1223 73     Resp 07/28/20 1223 16     Temp 07/28/20 1223 98 F (36.7 C)     Temp Source 07/28/20 1223 Oral     SpO2 07/28/20 1223 100 %     Weight 07/28/20 1224 165 lb (74.8 kg)     Height 07/28/20 1224  (1.702 m)     Head Circumference --      Peak Flow --      Pain Score 07/28/20 1223 10     Pain Loc --      Pain Edu? --       Excl. in GC? --      Constitutional: Alert and oriented. Well appearing and in no acute distress. Eyes: Conjunctivae are normal. PERRL. EOMI. Head: Atraumatic. ENT:      Ears:      Nose: No congestion/rhinnorhea.      Mouth/Throat: Mucous membranes are moist.  Neck: No stridor.   Cardiovascular: Normal rate, regular rhythm.  Good peripheral circulation. Respiratory: Normal respiratory effort without tachypnea or retractions. Lungs CTAB. Good air entry to the bases with no decreased or absent breath sounds. Gastrointestinal: Bowel sounds 4 quadrants. Soft and nontender to palpation. No guarding or rigidity. No palpable masses. No distention. Musculoskeletal: Full range of motion to all extremities. No gross deformities appreciated.  No tenderness to palpation to lumbar spine.  Minor left lumbar paraspinal tenderness.  Full range of motion of left hip.  Unable to actively extend at the left knee.  Full range of motion of left ankle.  Full range of motion of toes. Neurologic:  Normal speech and language. No gross focal neurologic deficits are appreciated.  Subjective numbness to medial left shin. Skin:  Skin is warm, dry and intact. No rash noted. Psychiatric: Mood and affect are normal. Speech and behavior are normal. Patient exhibits appropriate insight and judgement.   ____________________________________________   LABS (all labs ordered are listed, but only abnormal results are displayed)  ______________________________________  EKG   ____________________________________________  RADIOLOGY Lexine Baton, personally viewed and evaluated these images (plain radiographs) as part of my medical decision making, as well as reviewing the written report by the radiologist.  DG Tibia/Fibula Left  Result Date: 07/28/2020 CLINICAL DATA:  Left leg pain EXAM: LEFT TIBIA AND FIBULA - 2 VIEW COMPARISON:  None. FINDINGS: Frontal and lateral views of the left tibia and fibula  demonstrate no fractures. Alignment is anatomic. Medial and lateral compartmental osteoarthritis within the left knee, with extensive chondrocalcinosis. Mild osteoarthritis of the tibiotalar joint. Soft tissues are unremarkable. IMPRESSION: 1. Osteoarthritis of the left knee and ankle. No acute bony abnormality. Electronically Signed   By: Sharlet Salina M.D.   On: 07/28/2020 18:55   CT Head Wo Contrast  Result Date: 07/28/2020 CLINICAL DATA:  Neuro deficit, stroke suspected EXAM: CT HEAD WITHOUT CONTRAST TECHNIQUE: Contiguous axial images were obtained from the base of the skull through the vertex without intravenous contrast. COMPARISON:  None. FINDINGS: Brain: No evidence of acute infarction, hemorrhage, hydrocephalus, extra-axial collection, visible mass lesion or mass effect. Parenchymal volume is likely normal for age. Midline structures are normal. Basal cisterns are patent. Cerebellar tonsils are normally position. Vascular: Atherosclerotic calcification of the carotid siphons. No hyperdense vessel.  Skull: No calvarial fracture or suspicious osseous lesion. No scalp swelling or hematoma. Sinuses/Orbits: Paranasal sinuses and mastoid air cells are predominantly clear. Included orbital structures are unremarkable. Other: None IMPRESSION: 1. No acute intracranial abnormality. If there is persisting clinical concern for infarct, MRI is more sensitive and specific for early changes of ischemia. Electronically Signed   By: Kreg Shropshire M.D.   On: 07/28/2020 18:24   MR LUMBAR SPINE WO CONTRAST  Result Date: 07/28/2020 CLINICAL DATA:  Low back pain, progressive neurologic deficit. Additional history provided: Patient reports hip pain, progressively worsening and radiating into left knee. EXAM: MRI LUMBAR SPINE WITHOUT CONTRAST TECHNIQUE: Multiplanar, multisequence MR imaging of the lumbar spine was performed. No intravenous contrast was administered. COMPARISON:  Lumbar spine MRI 01/04/2011 FINDINGS:  Segmentation: For the purposes of this dictation, five lumbar vertebrae are assumed and the caudal most well-formed intervertebral disc is designated L5-S1. Alignment:  Mild L1-L2, L2-L3 and L5-S1 grade 1 retrolisthesis. Vertebrae: Vertebral body height is maintained. Moderate marrow edema is present within the left L4 and L5 pedicles and articular pillars as well as L4 spinous process. This edema is likely degenerative in the absence of any signs or symptoms of infection. Conus medullaris and cauda equina: Conus extends to the T12-L1 level. No signal abnormality within the visualized distal spinal cord. Paraspinal and other soft tissues: Small T2 hyperintense renal lesions bilaterally, incompletely assessed but possibly reflecting cysts. The partially imaged urinary bladder is distended paraspinal soft tissues within normal limits. Disc levels: Unless otherwise stated, the level by level findings below have not significantly changed since prior MRI 01/04/2011. Progressive moderate L5-S1 disc degeneration. No more than mild disc degeneration at the remaining levels. T11-T12: This level is imaged sagittally. Small disc bulge. No significant spinal canal or foraminal stenosis. T12-L1: Small disc bulge. No significant spinal canal or foraminal stenosis. L1-L2: Disc bulge. Superimposed tiny right center disc protrusion. A previously demonstrated left center disc protrusion has regressed and is no longer appreciated. Mild facet arthrosis/ligamentum flavum hypertrophy. Mild bilateral subarticular and central canal narrowing without nerve root impingement. Mild bilateral neural foraminal narrowing, new from the prior exam. L2-L3: Posterior annular fissure. Disc bulge. Mild facet arthrosis/ligamentum flavum hypertrophy. Mild bilateral subarticular and mild/moderate central canal stenosis, not significantly changed. Bilateral neural foraminal narrowing, unchanged (mild right, moderate left). L3-L4: Progressive disc bulge.  New superimposed left subarticular/foraminal disc extrusion. Progressive moderate facet arthrosis with ligamentum flavum hypertrophy. The disc protrusion contributes to moderate left subarticular stenosis, crowding the descending left L4 nerve root. Progressive mild right subarticular and central canal stenosis. Progressive moderate/severe bilateral neural foraminal narrowing. Additionally, there is extruded disc material posterior to the L4 vertebral body on the left (series 5, image 9). It is unclear if this extruded disc material is arising from the L3-L4 or L4-L5 discs and sequestered disc material cannot be excluded. L4-L5: Progressive disc bulge. New left foraminal/extraforaminal disc protrusion (series 8, image 23) (series 5, image 11). Progressive moderate facet arthrosis with ligamentum flavum hypertrophy. Trace left facet joint effusion. Progressive mild left subarticular narrowing without nerve root impingement. Progressive bilateral neural foraminal narrowing (mild right, moderate/severe left). L5-S1: Disc bulge. Redemonstrated broad-based disc extrusion spanning the central and subarticular zones with minimal caudal migration. Progressive moderate facet arthrosis and left greater than right ligamentum flavum hypertrophy. As before, the disc extrusion contributes to moderate/severe left subarticular stenosis, encroaching upon the descending left S1 nerve root. Mild right subarticular and central canal narrowing. No significant foraminal stenosis IMPRESSION: Lumbar spondylosis as  outlined and having progressed at several levels since the prior MRI of 01/04/2011. Findings are most notably as follows. At L3-L4, there is a new left subarticular/foraminal disc extrusion which contributes to moderate left subarticular stenosis, crowding the descending left L4 nerve root. This also contributes to moderate/severe left neural foraminal narrowing. Multifactorial progressive moderate/severe right neural foraminal  narrowing. Additionally, there is extruded disc material posterior to the L4 vertebral body on the left. It is unclear if this extruded disc material has arisen from the L3-L4 or L4-L5 discs and sequestered disc material cannot be excluded. At L4-L5, there is a new left foraminal/extraforaminal disc protrusion. This contributes to progressive moderate/severe left neural foraminal narrowing. It also contributes to mild left subarticular narrowing without nerve root impingement. At L5-S1, redemonstrated broad-based central disc extrusion spanning the central and bilateral subarticular zones with minimal caudal migration. As before, the disc extrusion contributes to moderate/severe left subarticular stenosis, encroaching upon the descending left S1 nerve root. Also new from the prior MRI, there is moderate edema within the left L4 and L5 pedicles/articular pillars and L4 spinous process. This edema is likely degenerative in the absence of any signs or symptoms of infection. Trace left L3-L4 and L4-L5 facet joint effusions. Electronically Signed   By: Jackey Loge DO   On: 07/28/2020 16:54   DG Hip Unilat W or Wo Pelvis 2-3 Views Left  Result Date: 07/28/2020 CLINICAL DATA:  Worsening left hip pain now radiating to left knee EXAM: DG HIP (WITH OR WITHOUT PELVIS) 2-3V LEFT COMPARISON:  None. FINDINGS: Prior bilateral total hip arthroplasties with screw fixed acetabular components. Hardware is in expected alignment. Some heterotopic ossification is noted about the acetabula and proximal femoral sites which can be seen post surgically. No acute periprosthetic fracture or complication is evident. No evidence of hardware failure or loosening. Remaining bones of the pelvis appear intact and congruent with partial ankylosis across the bilateral SI joints. Arcuate lines are contiguous. Degenerative features are present in the lumbar spine. Vascular calcifications and phleboliths noted in the pelvis. Bowel gas pattern is  unremarkable. IMPRESSION: 1. Prior bilateral total hip arthroplasties without evidence of hardware complication. 2. No acute osseous abnormality. 3. Partial ankylosis across the SI joints. Electronically Signed   By: Kreg Shropshire M.D.   On: 07/28/2020 18:58     ____________________________________________    PROCEDURES   Procedure(s) performed:    Procedures      ____________________________________________   INITIAL IMPRESSION / ASSESSMENT AND PLAN / ED COURSE  Pertinent labs & imaging results that were available during my care of the patient were reviewed by me and considered in my medical decision making (see chart for details).  Review of the [place] CSRS was performed in accordance of the [facility] prior to dispensing any controlled drugs.   Patient presented to the emergency department for evaluation of increasing left-sided low back pain and left leg weakness.  Patient has left quadricep weakness and is unable to extend the left knee in the emergency department.  He is unable to walk.  See lumbar MRI results above.  Dr. Marcell Barlow was consulted would like to admit patient for new herniated disc and new left leg weakness.  He will plan to evaluate patient in the morning for possible surgery tomorrow.  He would like patient to start Q6 10 mg of Decadron.  He requests that hospitalist be consulted for admission.  Dr. Para March excepts patient to the hospitalist service.    David Quinn was evaluated in  Emergency Department on 07/29/2020 for the symptoms described in the history of present illness. He was evaluated in the context of the global COVID-19 pandemic, which necessitated consideration that the patient might be at risk for infection with the SARS-CoV-2 virus that causes COVID-19. Institutional protocols and algorithms that pertain to the evaluation of patients at risk for COVID-19 are in a state of rapid change based on information released by regulatory bodies including the CDC  and federal and state organizations. These policies and algorithms were followed during the patient's care in the ED.   ____________________________________________  FINAL CLINICAL IMPRESSION(S) / ED DIAGNOSES  Final diagnoses:  Acute left-sided low back pain without sciatica  Weakness of left lower extremity      NEW MEDICATIONS STARTED DURING THIS VISIT:  ED Discharge Orders    None          This chart was dictated using voice recognition software/Dragon. Despite best efforts to proofread, errors can occur which can change the meaning. Any change was purely unintentional.    Enid Derry, PA-C 07/29/20 1033    Shaune Pollack, MD 07/29/20 1455

## 2020-07-28 NOTE — ED Notes (Signed)
Patient transported from CT/xray at this time via w/c

## 2020-07-29 ENCOUNTER — Inpatient Hospital Stay: Payer: BC Managed Care – PPO

## 2020-07-29 ENCOUNTER — Inpatient Hospital Stay: Payer: BC Managed Care – PPO | Admitting: Certified Registered"

## 2020-07-29 ENCOUNTER — Encounter: Payer: Self-pay | Admitting: Family Medicine

## 2020-07-29 ENCOUNTER — Encounter
Admission: EM | Disposition: A | Discharge status: Home Health | Payer: Self-pay | Source: Home / Self Care | Attending: Internal Medicine

## 2020-07-29 DIAGNOSIS — I10 Essential (primary) hypertension: Secondary | ICD-10-CM

## 2020-07-29 HISTORY — PX: LUMBAR LAMINECTOMY/DECOMPRESSION MICRODISCECTOMY: SHX5026

## 2020-07-29 LAB — BASIC METABOLIC PANEL
Anion gap: 10 (ref 5–15)
BUN: 35 mg/dL — ABNORMAL HIGH (ref 8–23)
CO2: 24 mmol/L (ref 22–32)
Calcium: 9.1 mg/dL (ref 8.9–10.3)
Chloride: 105 mmol/L (ref 98–111)
Creatinine, Ser: 1.16 mg/dL (ref 0.61–1.24)
GFR, Estimated: >60 mL/min (ref >60)
Glucose, Bld: 150 mg/dL — ABNORMAL HIGH (ref 70–99)
Potassium: 4.3 mmol/L (ref 3.5–5.1)
Sodium: 139 mmol/L (ref 135–145)

## 2020-07-29 LAB — CBC
HCT: 41.2 % (ref 39.0–52.0)
Hemoglobin: 13.2 g/dL (ref 13.0–17.0)
MCH: 26.5 pg (ref 26.0–34.0)
MCHC: 32 g/dL (ref 30.0–36.0)
MCV: 82.6 fL (ref 80.0–100.0)
Platelets: 309 10*3/uL (ref 150–400)
RBC: 4.99 MIL/uL (ref 4.22–5.81)
RDW: 13.2 % (ref 11.5–15.5)
WBC: 4.4 10*3/uL (ref 4.0–10.5)
nRBC: 0 % (ref 0.0–0.2)

## 2020-07-29 LAB — HIV ANTIBODY (ROUTINE TESTING W REFLEX): HIV Screen 4th Generation wRfx: NONREACTIVE

## 2020-07-29 SURGERY — LUMBAR LAMINECTOMY/DECOMPRESSION MICRODISCECTOMY 2 LEVELS
Anesthesia: General | Site: Back | Laterality: Left

## 2020-07-29 MED ORDER — ACETAMINOPHEN 10 MG/ML IV SOLN
INTRAVENOUS | Status: AC
  Filled 2020-07-29: qty 100

## 2020-07-29 MED ORDER — KETAMINE HCL 50 MG/ML IJ SOLN
INTRAMUSCULAR | Status: AC
  Filled 2020-07-29: qty 10

## 2020-07-29 MED ORDER — PROPOFOL 10 MG/ML IV BOLUS
INTRAVENOUS | Status: DC | PRN
  Administered 2020-07-29: 150 mg via INTRAVENOUS

## 2020-07-29 MED ORDER — BUPIVACAINE HCL (PF) 0.5 % IJ SOLN
INTRAMUSCULAR | Status: DC | PRN
  Administered 2020-07-29: 20 mL

## 2020-07-29 MED ORDER — ONDANSETRON HCL 4 MG/2ML IJ SOLN
INTRAMUSCULAR | Status: AC
  Filled 2020-07-29: qty 2

## 2020-07-29 MED ORDER — LACTATED RINGERS IV SOLN: INTRAVENOUS | Status: DC | PRN

## 2020-07-29 MED ORDER — LIDOCAINE HCL (CARDIAC) PF 100 MG/5ML IV SOSY
PREFILLED_SYRINGE | INTRAVENOUS | Status: DC | PRN
  Administered 2020-07-29: 100 mg via INTRAVENOUS

## 2020-07-29 MED ORDER — MIDAZOLAM HCL 2 MG/2ML IJ SOLN
INTRAMUSCULAR | Status: AC
  Filled 2020-07-29: qty 2

## 2020-07-29 MED ORDER — HYDROMORPHONE HCL 1 MG/ML IJ SOLN
INTRAMUSCULAR | Status: AC
  Filled 2020-07-29: qty 1

## 2020-07-29 MED ORDER — DEXMEDETOMIDINE (PRECEDEX) IN NS 20 MCG/5ML (4 MCG/ML) IV SYRINGE
PREFILLED_SYRINGE | INTRAVENOUS | Status: AC
  Filled 2020-07-29: qty 10

## 2020-07-29 MED ORDER — HYDROMORPHONE HCL 1 MG/ML IJ SOLN
INTRAMUSCULAR | Status: DC | PRN
Start: 2020-07-29 — End: 2020-07-29
  Administered 2020-07-29 (×2): .5 mg via INTRAVENOUS

## 2020-07-29 MED ORDER — SODIUM CHLORIDE 0.9 % IV SOLN: INTRAVENOUS | Status: DC | PRN

## 2020-07-29 MED ORDER — ACETAMINOPHEN 10 MG/ML IV SOLN
INTRAVENOUS | Status: DC | PRN
  Administered 2020-07-29: 1000 mg via INTRAVENOUS

## 2020-07-29 MED ORDER — SUCCINYLCHOLINE CHLORIDE 20 MG/ML IJ SOLN
INTRAMUSCULAR | Status: DC | PRN
  Administered 2020-07-29: 100 mg via INTRAVENOUS

## 2020-07-29 MED ORDER — DEXAMETHASONE SODIUM PHOSPHATE 10 MG/ML IJ SOLN
INTRAMUSCULAR | Status: DC | PRN
  Administered 2020-07-29: 10 mg via INTRAVENOUS

## 2020-07-29 MED ORDER — KETAMINE HCL 10 MG/ML IJ SOLN
INTRAMUSCULAR | Status: DC | PRN
  Administered 2020-07-29: 50 mg via INTRAVENOUS

## 2020-07-29 MED ORDER — BUPIVACAINE-EPINEPHRINE (PF) 0.5% -1:200000 IJ SOLN
INTRAMUSCULAR | Status: DC | PRN
  Administered 2020-07-29: 7 mL

## 2020-07-29 MED ORDER — ROCURONIUM BROMIDE 100 MG/10ML IV SOLN
INTRAVENOUS | Status: DC | PRN
  Administered 2020-07-29: 5 mg via INTRAVENOUS

## 2020-07-29 MED ORDER — METHYLPREDNISOLONE ACETATE 40 MG/ML IJ SUSP
INTRAMUSCULAR | Status: AC
  Filled 2020-07-29: qty 1

## 2020-07-29 MED ORDER — CEFAZOLIN SODIUM-DEXTROSE 2-3 GM-%(50ML) IV SOLR
INTRAVENOUS | Status: DC | PRN
  Administered 2020-07-29: 2 g via INTRAVENOUS

## 2020-07-29 MED ORDER — FENTANYL CITRATE (PF) 100 MCG/2ML IJ SOLN: 25.0000 ug | INTRAMUSCULAR | Status: DC | PRN

## 2020-07-29 MED ORDER — GABAPENTIN 300 MG PO CAPS
300.0000 mg | ORAL_CAPSULE | Freq: Three times a day (TID) | ORAL | Status: DC
  Administered 2020-07-29 – 2020-08-02 (×10): 300 mg via ORAL
  Filled 2020-07-29 (×2): qty 1
  Filled 2020-07-29: qty 3
  Filled 2020-07-29 (×4): qty 1
  Filled 2020-07-29: qty 3
  Filled 2020-07-29 (×2): qty 1

## 2020-07-29 MED ORDER — INFLUENZA VAC SPLIT QUAD 0.5 ML IM SUSY: 0.5000 mL | PREFILLED_SYRINGE | INTRAMUSCULAR | Status: DC

## 2020-07-29 MED ORDER — PROPOFOL 10 MG/ML IV BOLUS
INTRAVENOUS | Status: AC
  Filled 2020-07-29: qty 20

## 2020-07-29 MED ORDER — THROMBIN 5000 UNITS EX SOLR
CUTANEOUS | Status: DC | PRN
  Administered 2020-07-29: 5000 [IU] via TOPICAL

## 2020-07-29 MED ORDER — BUPIVACAINE-EPINEPHRINE (PF) 0.5% -1:200000 IJ SOLN
INTRAMUSCULAR | Status: AC
  Filled 2020-07-29: qty 30

## 2020-07-29 MED ORDER — ONDANSETRON HCL 4 MG/2ML IJ SOLN
INTRAMUSCULAR | Status: DC | PRN
  Administered 2020-07-29: 4 mg via INTRAVENOUS

## 2020-07-29 MED ORDER — SODIUM CHLORIDE FLUSH 0.9 % IV SOLN
INTRAVENOUS | Status: AC
  Filled 2020-07-29: qty 20

## 2020-07-29 MED ORDER — HYDROMORPHONE HCL 1 MG/ML IJ SOLN
INTRAMUSCULAR | Status: DC | PRN
Start: 2020-07-29 — End: 2020-07-29

## 2020-07-29 MED ORDER — BUPIVACAINE HCL (PF) 0.5 % IJ SOLN
INTRAMUSCULAR | Status: AC
  Filled 2020-07-29: qty 30

## 2020-07-29 MED ORDER — THROMBIN 5000 UNITS EX SOLR
CUTANEOUS | Status: AC
  Filled 2020-07-29: qty 5000

## 2020-07-29 MED ORDER — DEXMEDETOMIDINE HCL 200 MCG/2ML IV SOLN
INTRAVENOUS | Status: DC | PRN
  Administered 2020-07-29: 12 ug via INTRAVENOUS
  Administered 2020-07-29: 8 ug via INTRAVENOUS

## 2020-07-29 MED ORDER — METHYLPREDNISOLONE ACETATE 40 MG/ML IJ SUSP
INTRAMUSCULAR | Status: DC | PRN
  Administered 2020-07-29: 40 mg

## 2020-07-29 MED ORDER — SODIUM CHLORIDE 0.9% FLUSH
INTRAVENOUS | Status: DC | PRN
  Administered 2020-07-29: 20 mL

## 2020-07-29 MED ORDER — MIDAZOLAM HCL 2 MG/2ML IJ SOLN
INTRAMUSCULAR | Status: DC | PRN
  Administered 2020-07-29: 2 mg via INTRAVENOUS

## 2020-07-29 MED ORDER — BUPIVACAINE LIPOSOME 1.3 % IJ SUSP
INTRAMUSCULAR | Status: AC
  Filled 2020-07-29: qty 20

## 2020-07-29 MED ORDER — PROMETHAZINE HCL 25 MG/ML IJ SOLN: 6.2500 mg | INTRAMUSCULAR | Status: DC | PRN

## 2020-07-29 MED ORDER — PHENYLEPHRINE HCL (PRESSORS) 10 MG/ML IV SOLN
INTRAVENOUS | Status: DC | PRN
  Administered 2020-07-29 (×3): 100 ug via INTRAVENOUS

## 2020-07-29 MED ORDER — DEXAMETHASONE SODIUM PHOSPHATE 10 MG/ML IJ SOLN
INTRAMUSCULAR | Status: AC
  Filled 2020-07-29: qty 1

## 2020-07-29 SURGICAL SUPPLY — 56 items
BUR NEURO DRILL SOFT 3.0X3.8M (BURR) ×2 IMPLANT
CANISTER SUCT 1200ML W/VALVE (MISCELLANEOUS) ×2 IMPLANT
CHLORAPREP W/TINT 26 (MISCELLANEOUS) ×4 IMPLANT
CNTNR SPEC 2.5X3XGRAD LEK (MISCELLANEOUS) ×1
CONT SPEC 4OZ STER OR WHT (MISCELLANEOUS) ×1
CONTAINER SPEC 2.5X3XGRAD LEK (MISCELLANEOUS) ×1 IMPLANT
COUNTER NEEDLE 20/40 LG (NEEDLE) ×2 IMPLANT
COVER WAND RF STERILE (DRAPES) ×2 IMPLANT
CUP MEDICINE 2OZ PLAST GRAD ST (MISCELLANEOUS) ×3 IMPLANT
DERMABOND ADVANCED (GAUZE/BANDAGES/DRESSINGS) ×1
DERMABOND ADVANCED .7 DNX12 (GAUZE/BANDAGES/DRESSINGS) ×1 IMPLANT
DRAPE C ARM PK CFD 31 SPINE (DRAPES) ×3 IMPLANT
DRAPE C-ARM 42X70 (DRAPES) ×1 IMPLANT
DRAPE LAPAROTOMY 100X77 ABD (DRAPES) ×2 IMPLANT
DRAPE MICROSCOPE SPINE 48X150 (DRAPES) ×2 IMPLANT
DRAPE SURG 17X11 SM STRL (DRAPES) ×8 IMPLANT
ELECT BLADE 6.5 EXT (BLADE) ×1 IMPLANT
ELECT CAUTERY BLADE TIP 2.5 (TIP) ×2
ELECT EZSTD 165MM 6.5IN (MISCELLANEOUS) ×2
ELECT REM PT RETURN 9FT ADLT (ELECTROSURGICAL) ×2
ELECTRODE CAUTERY BLDE TIP 2.5 (TIP) ×1 IMPLANT
ELECTRODE EZSTD 165MM 6.5IN (MISCELLANEOUS) ×1 IMPLANT
ELECTRODE REM PT RTRN 9FT ADLT (ELECTROSURGICAL) ×1 IMPLANT
GLOVE BIOGEL PI IND STRL 7.0 (GLOVE) ×1 IMPLANT
GLOVE BIOGEL PI INDICATOR 7.0 (GLOVE) ×1
GLOVE SURG SYN 7.0 (GLOVE) ×6 IMPLANT
GLOVE SURG SYN 7.0 PF PI (GLOVE) ×2 IMPLANT
GLOVE SURG SYN 8.5  E (GLOVE) ×3
GLOVE SURG SYN 8.5 E (GLOVE) ×3 IMPLANT
GLOVE SURG SYN 8.5 PF PI (GLOVE) ×3 IMPLANT
GOWN SRG XL LVL 3 NONREINFORCE (GOWNS) ×1 IMPLANT
GOWN STRL NON-REIN TWL XL LVL3 (GOWNS) ×1
GOWN STRL REUS W/ TWL XL LVL3 (GOWN DISPOSABLE) ×1 IMPLANT
GOWN STRL REUS W/TWL XL LVL3 (GOWN DISPOSABLE) ×1
GRADUATE 1200CC STRL 31836 (MISCELLANEOUS) ×2 IMPLANT
KIT SPINAL PRONEVIEW (KITS) ×2 IMPLANT
KNIFE BAYONET SHORT DISCETOMY (MISCELLANEOUS) IMPLANT
MANIFOLD NEPTUNE II (INSTRUMENTS) ×2 IMPLANT
MARKER SKIN DUAL TIP RULER LAB (MISCELLANEOUS) ×4 IMPLANT
NDL SAFETY ECLIPSE 18X1.5 (NEEDLE) ×1 IMPLANT
NEEDLE HYPO 18GX1.5 SHARP (NEEDLE) ×1
NEEDLE HYPO 22GX1.5 SAFETY (NEEDLE) ×2 IMPLANT
NS IRRIG 1000ML POUR BTL (IV SOLUTION) ×2 IMPLANT
PACK LAMINECTOMY NEURO (CUSTOM PROCEDURE TRAY) ×2 IMPLANT
SPOGE SURGIFLO 8M (HEMOSTASIS) ×1
SPONGE SURGIFLO 8M (HEMOSTASIS) ×1 IMPLANT
SUT DVC VLOC 3-0 CL 6 P-12 (SUTURE) ×3 IMPLANT
SUT VIC AB 0 CT1 27 (SUTURE) ×1
SUT VIC AB 0 CT1 27XCR 8 STRN (SUTURE) ×1 IMPLANT
SUT VIC AB 2-0 CT1 18 (SUTURE) ×2 IMPLANT
SYR 20ML LL LF (SYRINGE) ×1 IMPLANT
SYR 30ML LL (SYRINGE) ×4 IMPLANT
SYR 3ML LL SCALE MARK (SYRINGE) ×2 IMPLANT
TAPE TRANSPORE STRL 2 31045 (GAUZE/BANDAGES/DRESSINGS) ×1 IMPLANT
TOWEL OR 17X26 4PK STRL BLUE (TOWEL DISPOSABLE) ×5 IMPLANT
TUBING CONNECTING 10 (TUBING) ×2 IMPLANT

## 2020-07-29 NOTE — H&P (View-Only) (Signed)
Referring Physician:  No referring provider defined for this encounter.  Primary Physician:  Marjie Skiff, NP  Chief Complaint:  Left leg weakness  History of Present Illness: 07/29/2020 David Quinn is a 64 y.o. male who presents with the chief complaint of lower back pain, left leg pain, and left leg weakness.  He began having issues 7-10 days ago with back pain.  Yesterday at work, he was bending over to pick something up and started having severe pain into his left leg as well as feeling that his left leg was dragging when he walked.  At one point, he sat down but was unable to get up due to weakness in left upper leg.  He was brought to the ER where he was given dexamethasone, which has helped his pain but not his weakness.  Bowel/Bladder: no dysfunction  [name] has no symptoms of cervical myelopathy.   The symptoms are causing a significant impact on the patient's life.   Review of Systems:  A 10 point review of systems is negative, except for the pertinent positives and negatives detailed in the HPI.  Past Medical History: Past Medical History:  Diagnosis Date  . Asthma   . Hypertension     Past Surgical History: Past Surgical History:  Procedure Laterality Date  . JOINT REPLACEMENT Bilateral    hip replacement  . TRACHEOSTOMY      Allergies: Allergies as of 07/28/2020  . (No Known Allergies)    Medications:  Current Facility-Administered Medications:  .  0.9 %  sodium chloride infusion, , Intravenous, Continuous, Mansy, Jan A, MD, Last Rate: 100 mL/hr at 07/29/20 0556, Rate Verify at 07/29/20 0556 .  acetaminophen (TYLENOL) tablet 650 mg, 650 mg, Oral, Q6H PRN **OR** acetaminophen (TYLENOL) suppository 650 mg, 650 mg, Rectal, Q6H PRN, Mansy, Jan A, MD .  albuterol (VENTOLIN HFA) 108 (90 Base) MCG/ACT inhaler 2 puff, 2 puff, Inhalation, Q6H PRN, Mansy, Jan A, MD .  cholecalciferol (VITAMIN D3) tablet 1,000 Units, 1,000 Units, Oral,  Daily, Mansy, Jan A, MD .  dexamethasone (DECADRON) injection 10 mg, 10 mg, Intravenous, Q6H, Mansy, Jan A, MD, 10 mg at 07/29/20 0556 .  labetalol (NORMODYNE) injection 20 mg, 20 mg, Intravenous, Q3H PRN, Mansy, Jan A, MD .  magnesium hydroxide (MILK OF MAGNESIA) suspension 30 mL, 30 mL, Oral, Daily PRN, Mansy, Vernetta Honey, MD .  mometasone-formoterol [facility]) 200-5 MCG/ACT inhaler 2 puff, 2 puff, Inhalation, BID, Mansy, Vernetta Honey, MD, 2 puff at 07/29/20 0003 .  morphine 2 MG/ML injection 2 mg, 2 mg, Intravenous, Q4H PRN, Mansy, Jan A, MD .  multivitamin with minerals tablet 1 tablet, 1 tablet, Oral, Daily, Mansy, Jan A, MD .  ondansetron (ZOFRAN) tablet 4 mg, 4 mg, Oral, Q6H PRN **OR** ondansetron (ZOFRAN) injection 4 mg, 4 mg, Intravenous, Q6H PRN, Mansy, Jan A, MD .  traZODone (DESYREL) tablet 25 mg, 25 mg, Oral, QHS PRN, Mansy, Vernetta Honey, MD  Current Outpatient Medications:  .  albuterol (PROAIR HFA) 108 (90 Base) MCG/ACT inhaler, Inhale 2 puffs into the lungs every 6 (six) hours as needed for wheezing or shortness of breath., Disp: 18 g, Rfl: 2 .  aspirin 81 MG tablet, Take 1 tablet (81 mg total) by mouth daily., Disp: 30 tablet, Rfl: 12 .  cholecalciferol (VITAMIN D) 1000 UNITS tablet, Take 1,000 Units by mouth daily., Disp: , Rfl:  .  Fluticasone-Salmeterol (ADVAIR DISKUS) 250-50 MCG/DOSE AEPB, Inhale 1 puff into the lungs 2 (two)  times daily., Disp: 60 each, Rfl: 3 .  lisinopril (ZESTRIL) 10 MG tablet, Take 1 tablet (10 mg total) by mouth daily., Disp: 90 tablet, Rfl: 1 .  Multiple Vitamin (MULTIVITAMIN) tablet, Take 1 tablet by mouth daily., Disp: , Rfl:  .  ibuprofen (ADVIL,MOTRIN) 600 MG tablet, Take 600 mg by mouth every 6 (six) hours as needed. for pain (Patient not taking: Reported on 07/28/2020), Disp: , Rfl:    Social History: Social History   Tobacco Use  . Smoking status: Never Smoker  . Smokeless tobacco: Never Used  Vaping Use  . Vaping Use: Never used  Substance Use Topics  .  Alcohol use: No    Alcohol/week: 0.0 standard drinks  . Drug use: No    Family Medical History: Family History  Problem Relation Age of Onset  . Asthma Mother   . Cancer Mother   . Hyperlipidemia Mother   . Hypertension Mother   . Hypertension Father   . Stroke Father   . Cancer Brother        stomach  . Seizures Brother     Physical Examination: Vitals:   07/29/20 0152 07/29/20 0600  BP: 119/84 118/76  Pulse: 73 65  Resp: 15 16  Temp:    SpO2: 97% 100%     General: Patient is well developed, well nourished, calm, collected, and in no apparent distress.  Psychiatric: Patient is non-anxious.  Head:  Pupils equal, round, and reactive to light.  ENT:  Oral mucosa appears well hydrated.  Neck:   Supple.  Full range of motion.  Respiratory: Patient is breathing without any difficulty.  Extremities: No edema.  Vascular: Palpable pulses in dorsal pedal vessels.  Skin:   On exposed skin, there are no abnormal skin lesions.  Heart sounds normal no MRG. Chest Clear to Auscultation Bilaterally.   NEUROLOGICAL:  General: In no acute distress.   Awake, alert, oriented to person, place, and time.  Pupils equal round and reactive to light.  Facial tone is symmetric.  Tongue protrusion is midline.  There is no pronator drift.  ROM of spine: full.  Strength: Side Biceps Triceps Deltoid Interossei Grip Wrist Ext. Wrist Flex.  R 5 5 5 5 5 5 5   L 5 5 5 5 5 5 5    Side Iliopsoas Quads Hamstring PF DF EHL  R 5 5 5 5 5 5   L 5 2 5 5 5 5    Reflexes are 2+ and symmetric at the biceps, triceps, brachioradialis, and achilles.  He has 2+ R patellar reflex, but no L patellar reflex. Bilateral upper and lower extremity sensation is intact to light touch and pin prick EXCEPT absent L L4 distribution sensation.  Clonus is not present.  Toes are down-going.  Gait is untested due to marked weakness.  Hoffman's is absent.  Imaging: MRI L spine 07/28/20 IMPRESSION: Lumbar spondylosis  as outlined and having progressed at several levels since the prior MRI of 01/04/2011. Findings are most notably as follows.  At L3-L4, there is a new left subarticular/foraminal disc extrusion which contributes to moderate left subarticular stenosis, crowding the descending left L4 nerve root. This also contributes to moderate/severe left neural foraminal narrowing. Multifactorial progressive moderate/severe right neural foraminal narrowing. Additionally, there is extruded disc material posterior to the L4 vertebral body on the left. It is unclear if this extruded disc material has arisen from the L3-L4 or L4-L5 discs and sequestered disc material cannot be excluded.  At L4-L5, there is a  new left foraminal/extraforaminal disc protrusion. This contributes to progressive moderate/severe left neural foraminal narrowing. It also contributes to mild left subarticular narrowing without nerve root impingement.  At L5-S1, redemonstrated broad-based central disc extrusion spanning the central and bilateral subarticular zones with minimal caudal migration. As before, the disc extrusion contributes to moderate/severe left subarticular stenosis, encroaching upon the descending left S1 nerve root.  Also new from the prior MRI, there is moderate edema within the left L4 and L5 pedicles/articular pillars and L4 spinous process. This edema is likely degenerative in the absence of any signs or symptoms of infection. Trace left L3-L4 and L4-L5 facet joint effusions.   Electronically Signed   By: Jackey Loge DO   On: 07/28/2020 16:54   I have personally reviewed the images and agree with the above interpretation.  Labs: CBC Latest Ref Rng & Units 07/29/2020 07/28/2020 06/07/2020  WBC 4.0 - 10.5 K/uL 4.4 6.4 3.3(L)  Hemoglobin 13.0 - 17.0 g/dL 15.1 76.1 60.7  Hematocrit 39 - 52 % 41.2 45.9 42.7  Platelets 150 - 400 K/uL 309 341 279       Assessment and Plan: Mr. Schewe is a  pleasant 64 y.o. male with acute left L4 radiculopathy causing profound weakness of his left quadriceps and inability to walk.  He has acute disc herniations at L3-4 and L4-5 that compress the left L4 nerve root.  I recommended surgical intervention given his profound weakness.  I recommended left L3-4 microdiscectomy and left L4-5 far lateral discectomy.  I discussed the planned procedure at length with the patient, including the risks, benefits, alternatives, and indications. The risks discussed include but are not limited to bleeding, infection, need for reoperation, spinal fluid leak, stroke, vision loss, anesthetic complication, coma, paralysis, and even death. I also described in detail that improvement was not guaranteed.  The patient expressed understanding of these risks, and asked that we proceed with surgery. I described the surgery in layman's terms, and gave ample opportunity for questions, which were answered to the best of my ability.    Zunairah Devers K. Myer Haff MD, MPHS Dept. of Neurosurgery

## 2020-07-29 NOTE — Consult Note (Signed)
Referring Physician:  No referring provider defined for this encounter.  Primary Physician:  Marjie Skiff, NP  Chief Complaint:  Left leg weakness  History of Present Illness: 07/29/2020 David Quinn is a 64 y.o. male who presents with the chief complaint of lower back pain, left leg pain, and left leg weakness.  He began having issues 7-10 days ago with back pain.  Yesterday at work, he was bending over to pick something up and started having severe pain into his left leg as well as feeling that his left leg was dragging when he walked.  At one point, he sat down but was unable to get up due to weakness in left upper leg.  He was brought to the ER where he was given dexamethasone, which has helped his pain but not his weakness.  Bowel/Bladder: no dysfunction  [name] has no symptoms of cervical myelopathy.   The symptoms are causing a significant impact on the patient's life.   Review of Systems:  A 10 point review of systems is negative, except for the pertinent positives and negatives detailed in the HPI.  Past Medical History: Past Medical History:  Diagnosis Date  . Asthma   . Hypertension     Past Surgical History: Past Surgical History:  Procedure Laterality Date  . JOINT REPLACEMENT Bilateral    hip replacement  . TRACHEOSTOMY      Allergies: Allergies as of 07/28/2020  . (No Known Allergies)    Medications:  Current Facility-Administered Medications:  .  0.9 %  sodium chloride infusion, , Intravenous, Continuous, Mansy, Jan A, MD, Last Rate: 100 mL/hr at 07/29/20 0556, Rate Verify at 07/29/20 0556 .  acetaminophen (TYLENOL) tablet 650 mg, 650 mg, Oral, Q6H PRN **OR** acetaminophen (TYLENOL) suppository 650 mg, 650 mg, Rectal, Q6H PRN, Mansy, Jan A, MD .  albuterol (VENTOLIN HFA) 108 (90 Base) MCG/ACT inhaler 2 puff, 2 puff, Inhalation, Q6H PRN, Mansy, Jan A, MD .  cholecalciferol (VITAMIN D3) tablet 1,000 Units, 1,000 Units, Oral,  Daily, Mansy, Jan A, MD .  dexamethasone (DECADRON) injection 10 mg, 10 mg, Intravenous, Q6H, Mansy, Jan A, MD, 10 mg at 07/29/20 0556 .  labetalol (NORMODYNE) injection 20 mg, 20 mg, Intravenous, Q3H PRN, Mansy, Jan A, MD .  magnesium hydroxide (MILK OF MAGNESIA) suspension 30 mL, 30 mL, Oral, Daily PRN, Mansy, Vernetta Honey, MD .  mometasone-formoterol [facility]) 200-5 MCG/ACT inhaler 2 puff, 2 puff, Inhalation, BID, Mansy, Vernetta Honey, MD, 2 puff at 07/29/20 0003 .  morphine 2 MG/ML injection 2 mg, 2 mg, Intravenous, Q4H PRN, Mansy, Jan A, MD .  multivitamin with minerals tablet 1 tablet, 1 tablet, Oral, Daily, Mansy, Jan A, MD .  ondansetron (ZOFRAN) tablet 4 mg, 4 mg, Oral, Q6H PRN **OR** ondansetron (ZOFRAN) injection 4 mg, 4 mg, Intravenous, Q6H PRN, Mansy, Jan A, MD .  traZODone (DESYREL) tablet 25 mg, 25 mg, Oral, QHS PRN, Mansy, Vernetta Honey, MD  Current Outpatient Medications:  .  albuterol (PROAIR HFA) 108 (90 Base) MCG/ACT inhaler, Inhale 2 puffs into the lungs every 6 (six) hours as needed for wheezing or shortness of breath., Disp: 18 g, Rfl: 2 .  aspirin 81 MG tablet, Take 1 tablet (81 mg total) by mouth daily., Disp: 30 tablet, Rfl: 12 .  cholecalciferol (VITAMIN D) 1000 UNITS tablet, Take 1,000 Units by mouth daily., Disp: , Rfl:  .  Fluticasone-Salmeterol (ADVAIR DISKUS) 250-50 MCG/DOSE AEPB, Inhale 1 puff into the lungs 2 (two)  times daily., Disp: 60 each, Rfl: 3 .  lisinopril (ZESTRIL) 10 MG tablet, Take 1 tablet (10 mg total) by mouth daily., Disp: 90 tablet, Rfl: 1 .  Multiple Vitamin (MULTIVITAMIN) tablet, Take 1 tablet by mouth daily., Disp: , Rfl:  .  ibuprofen (ADVIL,MOTRIN) 600 MG tablet, Take 600 mg by mouth every 6 (six) hours as needed. for pain (Patient not taking: Reported on 07/28/2020), Disp: , Rfl:    Social History: Social History   Tobacco Use  . Smoking status: Never Smoker  . Smokeless tobacco: Never Used  Vaping Use  . Vaping Use: Never used  Substance Use Topics  .  Alcohol use: No    Alcohol/week: 0.0 standard drinks  . Drug use: No    Family Medical History: Family History  Problem Relation Age of Onset  . Asthma Mother   . Cancer Mother   . Hyperlipidemia Mother   . Hypertension Mother   . Hypertension Father   . Stroke Father   . Cancer Brother        stomach  . Seizures Brother     Physical Examination: Vitals:   07/29/20 0152 07/29/20 0600  BP: 119/84 118/76  Pulse: 73 65  Resp: 15 16  Temp:    SpO2: 97% 100%     General: Patient is well developed, well nourished, calm, collected, and in no apparent distress.  Psychiatric: Patient is non-anxious.  Head:  Pupils equal, round, and reactive to light.  ENT:  Oral mucosa appears well hydrated.  Neck:   Supple.  Full range of motion.  Respiratory: Patient is breathing without any difficulty.  Extremities: No edema.  Vascular: Palpable pulses in dorsal pedal vessels.  Skin:   On exposed skin, there are no abnormal skin lesions.  Heart sounds normal no MRG. Chest Clear to Auscultation Bilaterally.   NEUROLOGICAL:  General: In no acute distress.   Awake, alert, oriented to person, place, and time.  Pupils equal round and reactive to light.  Facial tone is symmetric.  Tongue protrusion is midline.  There is no pronator drift.  ROM of spine: full.  Strength: Side Biceps Triceps Deltoid Interossei Grip Wrist Ext. Wrist Flex.  R 5 5 5 5 5 5 5   L 5 5 5 5 5 5 5    Side Iliopsoas Quads Hamstring PF DF EHL  R 5 5 5 5 5 5   L 5 2 5 5 5 5    Reflexes are 2+ and symmetric at the biceps, triceps, brachioradialis, and achilles.  He has 2+ R patellar reflex, but no L patellar reflex. Bilateral upper and lower extremity sensation is intact to light touch and pin prick EXCEPT absent L L4 distribution sensation.  Clonus is not present.  Toes are down-going.  Gait is untested due to marked weakness.  Hoffman's is absent.  Imaging: MRI L spine 07/28/20 IMPRESSION: Lumbar spondylosis  as outlined and having progressed at several levels since the prior MRI of 01/04/2011. Findings are most notably as follows.  At L3-L4, there is a new left subarticular/foraminal disc extrusion which contributes to moderate left subarticular stenosis, crowding the descending left L4 nerve root. This also contributes to moderate/severe left neural foraminal narrowing. Multifactorial progressive moderate/severe right neural foraminal narrowing. Additionally, there is extruded disc material posterior to the L4 vertebral body on the left. It is unclear if this extruded disc material has arisen from the L3-L4 or L4-L5 discs and sequestered disc material cannot be excluded.  At L4-L5, there is a  new left foraminal/extraforaminal disc protrusion. This contributes to progressive moderate/severe left neural foraminal narrowing. It also contributes to mild left subarticular narrowing without nerve root impingement.  At L5-S1, redemonstrated broad-based central disc extrusion spanning the central and bilateral subarticular zones with minimal caudal migration. As before, the disc extrusion contributes to moderate/severe left subarticular stenosis, encroaching upon the descending left S1 nerve root.  Also new from the prior MRI, there is moderate edema within the left L4 and L5 pedicles/articular pillars and L4 spinous process. This edema is likely degenerative in the absence of any signs or symptoms of infection. Trace left L3-L4 and L4-L5 facet joint effusions.   Electronically Signed   By: Jackey Loge DO   On: 07/28/2020 16:54   I have personally reviewed the images and agree with the above interpretation.  Labs: CBC Latest Ref Rng & Units 07/29/2020 07/28/2020 06/07/2020  WBC 4.0 - 10.5 K/uL 4.4 6.4 3.3(L)  Hemoglobin 13.0 - 17.0 g/dL 15.1 76.1 60.7  Hematocrit 39 - 52 % 41.2 45.9 42.7  Platelets 150 - 400 K/uL 309 341 279       Assessment and Plan: Mr. Schewe is a  pleasant 64 y.o. male with acute left L4 radiculopathy causing profound weakness of his left quadriceps and inability to walk.  He has acute disc herniations at L3-4 and L4-5 that compress the left L4 nerve root.  I recommended surgical intervention given his profound weakness.  I recommended left L3-4 microdiscectomy and left L4-5 far lateral discectomy.  I discussed the planned procedure at length with the patient, including the risks, benefits, alternatives, and indications. The risks discussed include but are not limited to bleeding, infection, need for reoperation, spinal fluid leak, stroke, vision loss, anesthetic complication, coma, paralysis, and even death. I also described in detail that improvement was not guaranteed.  The patient expressed understanding of these risks, and asked that we proceed with surgery. I described the surgery in layman's terms, and gave ample opportunity for questions, which were answered to the best of my ability.    Zayda Angell K. Myer Haff MD, MPHS Dept. of Neurosurgery

## 2020-07-29 NOTE — Anesthesia Procedure Notes (Signed)
Procedure Name: Intubation Date/Time: 07/29/2020 1:42 PM Performed by: Katherine Basset, CRNA Pre-anesthesia Checklist: Patient identified, Emergency Drugs available, Suction available and Patient being monitored Patient Re-evaluated:Patient Re-evaluated prior to induction Oxygen Delivery Method: Circle system utilized Preoxygenation: Pre-oxygenation with 100% oxygen Induction Type: IV induction Ventilation: Mask ventilation without difficulty Laryngoscope Size: Miller and 2 Grade View: Grade I Tube type: Oral Tube size: 7.5 mm Number of attempts: 1 Airway Equipment and Method: Stylet and Oral airway Placement Confirmation: ETT inserted through vocal cords under direct vision,  positive ETCO2 and breath sounds checked- equal and bilateral Secured at: 22 cm Tube secured with: Tape Dental Injury: Teeth and Oropharynx as per pre-operative assessment

## 2020-07-29 NOTE — Transfer of Care (Signed)
Immediate Anesthesia Transfer of Care Note  Patient: [name]  Procedure(s) Performed: LUMBAR LAMINECTOMY/DECOMPRESSION MICRODISCECTOMY 2 LEVELS (Left Back)  Patient Location: PACU  Anesthesia Type:General  Level of Consciousness: drowsy  Airway & Oxygen Therapy: Patient Spontanous Breathing and Patient connected to face mask oxygen  Post-op Assessment: Report given to RN and Post -op Vital signs reviewed and stable  Post vital signs: Reviewed and stable  Last Vitals:  Vitals Value Taken Time  BP 114/79 07/29/20 1656  Temp 36 C 07/29/20 1655  Pulse 59 07/29/20 1659  Resp 12 07/29/20 1659  SpO2 98 % 07/29/20 1659  Vitals shown include unvalidated device data.  Last Pain:  Vitals:   07/29/20 1655  TempSrc:   PainSc: Asleep         Complications: No complications documented.

## 2020-07-29 NOTE — Anesthesia Preprocedure Evaluation (Signed)
Anesthesia Evaluation  Patient identified by MRN, date of birth, ID band Patient awake    Reviewed: Allergy & Precautions, H&P , NPO status , Patient's Chart, lab work & pertinent test results, reviewed documented beta blocker date and time   History of Anesthesia Complications Negative for: history of anesthetic complications  Airway Mallampati: III  TM Distance: >3 FB Neck ROM: full    Dental  (+) Dental Advidsory Given, Teeth Intact, Missing, Partial Upper   Pulmonary neg shortness of breath, asthma , neg recent URI,    Pulmonary exam normal breath sounds clear to auscultation       Cardiovascular Exercise Tolerance: Good hypertension, (-) angina(-) Past MI and (-) Cardiac Stents Normal cardiovascular exam(-) dysrhythmias (-) Valvular Problems/Murmurs Rhythm:regular Rate:Normal     Neuro/Psych neg Seizures  Neuromuscular disease negative psych ROS   GI/Hepatic negative GI ROS, Neg liver ROS,   Endo/Other  negative endocrine ROS  Renal/GU negative Renal ROS  negative genitourinary   Musculoskeletal   Abdominal   Peds  Hematology negative hematology ROS (+)   Anesthesia Other Findings Past Medical History: No date: Asthma No date: Hypertension   Reproductive/Obstetrics negative OB ROS                             Anesthesia Physical Anesthesia Plan  ASA: II  Anesthesia Plan: General   Post-op Pain Management:    Induction: Intravenous  PONV Risk Score and Plan: 2 and Ondansetron, Dexamethasone, Midazolam and Treatment may vary due to age or medical condition  Airway Management Planned: Oral ETT  Additional Equipment:   Intra-op Plan:   Post-operative Plan: Extubation in OR  Informed Consent: I have reviewed the patients History and Physical, chart, labs and discussed the procedure including the risks, benefits and alternatives for the proposed anesthesia with the patient or  authorized representative who has indicated his/her understanding and acceptance.     Dental Advisory Given  Plan Discussed with: Anesthesiologist, CRNA and Surgeon  Anesthesia Plan Comments:         Anesthesia Quick Evaluation

## 2020-07-29 NOTE — Progress Notes (Signed)
[place] [facility]   PATIENT NAME: David MohairDaniel Quinn    MR#:  [id number]  DATE OF BIRTH:  04/13/1956  SUBJECTIVE:  CHIEF COMPLAINT:   Chief Complaint  Patient presents with  . Knee Pain  . Hip Pain  Complains of back pain and weakness in the left leg REVIEW OF SYSTEMS:  Review of Systems  Constitutional: Negative for diaphoresis, fever, malaise/fatigue and weight loss.  HENT: Negative for ear discharge, ear pain, hearing loss, nosebleeds, sore throat and tinnitus.   Eyes: Negative for blurred vision and pain.  Respiratory: Negative for cough, hemoptysis, shortness of breath and wheezing.   Cardiovascular: Negative for chest pain, palpitations, orthopnea and leg swelling.  Gastrointestinal: Negative for abdominal pain, blood in stool, constipation, diarrhea, heartburn, nausea and vomiting.  Genitourinary: Negative for dysuria, frequency and urgency.  Musculoskeletal: Positive for back pain. Negative for myalgias.  Skin: Negative for itching and rash.  Neurological: Negative for dizziness, tingling, tremors, focal weakness, seizures, weakness and headaches.  Psychiatric/Behavioral: Negative for depression. The patient is not nervous/anxious.    DRUG ALLERGIES:  No Known Allergies VITALS:  Blood pressure (!) 140/97, pulse 71, temperature 97.9 F (36.6 C), temperature source Oral, resp. rate 16, height 5\' 7"  (1.702 m), weight 74.8 kg, SpO2 95 %. PHYSICAL EXAMINATION:  Physical Exam HENT:     Head: Normocephalic and atraumatic.  Eyes:     Conjunctiva/sclera: Conjunctivae normal.     Pupils: Pupils are equal, round, and reactive to light.  Neck:     Thyroid: No thyromegaly.     Trachea: No tracheal deviation.  Cardiovascular:     Rate and Rhythm: Normal rate and regular rhythm.     Heart sounds: Normal heart sounds.  Pulmonary:     Effort: Pulmonary effort is normal. No respiratory distress.     Breath sounds: Normal breath sounds. No wheezing.  Chest:      Chest wall: No tenderness.  Abdominal:     General: Bowel sounds are normal. There is no distension.     Palpations: Abdomen is soft.     Tenderness: There is no abdominal tenderness.  Musculoskeletal:        General: Normal range of motion.     Cervical back: Normal range of motion and neck supple.  Skin:    General: Skin is warm and dry.     Findings: No rash.  Neurological:     Mental Status: He is alert and oriented to person, place, and time.     Cranial Nerves: No cranial nerve deficit.     Motor: Weakness present.     Comments: Left lower extremity, difficulty raising against resistance with radicular pain from back    LABORATORY PANEL:  Male CBC Recent Labs  Lab 07/29/20 0449  WBC 4.4  HGB 13.2  HCT 41.2  PLT 309   ------------------------------------------------------------------------------------------------------------------ Chemistries  Recent Labs  Lab 07/28/20 1931 07/28/20 1931 07/29/20 0449  NA 138   < > 139  K 5.7*   < > 4.3  CL 101   < > 105  CO2 24   < > 24  GLUCOSE 91   < > 150*  BUN 42*   < > 35*  CREATININE 1.42*   < > 1.16  CALCIUM 9.3   < > 9.1  AST 37  --   --   ALT 23  --   --   ALKPHOS 73  --   --   BILITOT 1.0  --   --    < > =  values in this interval not displayed.   RADIOLOGY:  DG Tibia/Fibula Left  Result Date: 07/28/2020 CLINICAL DATA:  Left leg pain EXAM: LEFT TIBIA AND FIBULA - 2 VIEW COMPARISON:  None. FINDINGS: Frontal and lateral views of the left tibia and fibula demonstrate no fractures. Alignment is anatomic. Medial and lateral compartmental osteoarthritis within the left knee, with extensive chondrocalcinosis. Mild osteoarthritis of the tibiotalar joint. Soft tissues are unremarkable. IMPRESSION: 1. Osteoarthritis of the left knee and ankle. No acute bony abnormality. Electronically Signed   By: Sharlet Salina M.D.   On: 07/28/2020 18:55   CT Head Wo Contrast  Result Date: 07/28/2020 CLINICAL DATA:  Neuro deficit,  stroke suspected EXAM: CT HEAD WITHOUT CONTRAST TECHNIQUE: Contiguous axial images were obtained from the base of the skull through the vertex without intravenous contrast. COMPARISON:  None. FINDINGS: Brain: No evidence of acute infarction, hemorrhage, hydrocephalus, extra-axial collection, visible mass lesion or mass effect. Parenchymal volume is likely normal for age. Midline structures are normal. Basal cisterns are patent. Cerebellar tonsils are normally position. Vascular: Atherosclerotic calcification of the carotid siphons. No hyperdense vessel. Skull: No calvarial fracture or suspicious osseous lesion. No scalp swelling or hematoma. Sinuses/Orbits: Paranasal sinuses and mastoid air cells are predominantly clear. Included orbital structures are unremarkable. Other: None IMPRESSION: 1. No acute intracranial abnormality. If there is persisting clinical concern for infarct, MRI is more sensitive and specific for early changes of ischemia. Electronically Signed   By: Kreg Shropshire M.D.   On: 07/28/2020 18:24   MR LUMBAR SPINE WO CONTRAST  Result Date: 07/28/2020 CLINICAL DATA:  Low back pain, progressive neurologic deficit. Additional history provided: Patient reports hip pain, progressively worsening and radiating into left knee. EXAM: MRI LUMBAR SPINE WITHOUT CONTRAST TECHNIQUE: Multiplanar, multisequence MR imaging of the lumbar spine was performed. No intravenous contrast was administered. COMPARISON:  Lumbar spine MRI 01/04/2011 FINDINGS: Segmentation: For the purposes of this dictation, five lumbar vertebrae are assumed and the caudal most well-formed intervertebral disc is designated L5-S1. Alignment:  Mild L1-L2, L2-L3 and L5-S1 grade 1 retrolisthesis. Vertebrae: Vertebral body height is maintained. Moderate marrow edema is present within the left L4 and L5 pedicles and articular pillars as well as L4 spinous process. This edema is likely degenerative in the absence of any signs or symptoms of  infection. Conus medullaris and cauda equina: Conus extends to the T12-L1 level. No signal abnormality within the visualized distal spinal cord. Paraspinal and other soft tissues: Small T2 hyperintense renal lesions bilaterally, incompletely assessed but possibly reflecting cysts. The partially imaged urinary bladder is distended paraspinal soft tissues within normal limits. Disc levels: Unless otherwise stated, the level by level findings below have not significantly changed since prior MRI 01/04/2011. Progressive moderate L5-S1 disc degeneration. No more than mild disc degeneration at the remaining levels. T11-T12: This level is imaged sagittally. Small disc bulge. No significant spinal canal or foraminal stenosis. T12-L1: Small disc bulge. No significant spinal canal or foraminal stenosis. L1-L2: Disc bulge. Superimposed tiny right center disc protrusion. A previously demonstrated left center disc protrusion has regressed and is no longer appreciated. Mild facet arthrosis/ligamentum flavum hypertrophy. Mild bilateral subarticular and central canal narrowing without nerve root impingement. Mild bilateral neural foraminal narrowing, new from the prior exam. L2-L3: Posterior annular fissure. Disc bulge. Mild facet arthrosis/ligamentum flavum hypertrophy. Mild bilateral subarticular and mild/moderate central canal stenosis, not significantly changed. Bilateral neural foraminal narrowing, unchanged (mild right, moderate left). L3-L4: Progressive disc bulge. New superimposed left subarticular/foraminal disc extrusion. Progressive  moderate facet arthrosis with ligamentum flavum hypertrophy. The disc protrusion contributes to moderate left subarticular stenosis, crowding the descending left L4 nerve root. Progressive mild right subarticular and central canal stenosis. Progressive moderate/severe bilateral neural foraminal narrowing. Additionally, there is extruded disc material posterior to the L4 vertebral body on the  left (series 5, image 9). It is unclear if this extruded disc material is arising from the L3-L4 or L4-L5 discs and sequestered disc material cannot be excluded. L4-L5: Progressive disc bulge. New left foraminal/extraforaminal disc protrusion (series 8, image 23) (series 5, image 11). Progressive moderate facet arthrosis with ligamentum flavum hypertrophy. Trace left facet joint effusion. Progressive mild left subarticular narrowing without nerve root impingement. Progressive bilateral neural foraminal narrowing (mild right, moderate/severe left). L5-S1: Disc bulge. Redemonstrated broad-based disc extrusion spanning the central and subarticular zones with minimal caudal migration. Progressive moderate facet arthrosis and left greater than right ligamentum flavum hypertrophy. As before, the disc extrusion contributes to moderate/severe left subarticular stenosis, encroaching upon the descending left S1 nerve root. Mild right subarticular and central canal narrowing. No significant foraminal stenosis IMPRESSION: Lumbar spondylosis as outlined and having progressed at several levels since the prior MRI of 01/04/2011. Findings are most notably as follows. At L3-L4, there is a new left subarticular/foraminal disc extrusion which contributes to moderate left subarticular stenosis, crowding the descending left L4 nerve root. This also contributes to moderate/severe left neural foraminal narrowing. Multifactorial progressive moderate/severe right neural foraminal narrowing. Additionally, there is extruded disc material posterior to the L4 vertebral body on the left. It is unclear if this extruded disc material has arisen from the L3-L4 or L4-L5 discs and sequestered disc material cannot be excluded. At L4-L5, there is a new left foraminal/extraforaminal disc protrusion. This contributes to progressive moderate/severe left neural foraminal narrowing. It also contributes to mild left subarticular narrowing without nerve root  impingement. At L5-S1, redemonstrated broad-based central disc extrusion spanning the central and bilateral subarticular zones with minimal caudal migration. As before, the disc extrusion contributes to moderate/severe left subarticular stenosis, encroaching upon the descending left S1 nerve root. Also new from the prior MRI, there is moderate edema within the left L4 and L5 pedicles/articular pillars and L4 spinous process. This edema is likely degenerative in the absence of any signs or symptoms of infection. Trace left L3-L4 and L4-L5 facet joint effusions. Electronically Signed   By: Jackey Loge DO   On: 07/28/2020 16:54   DG Hip Unilat W or Wo Pelvis 2-3 Views Left  Result Date: 07/28/2020 CLINICAL DATA:  Worsening left hip pain now radiating to left knee EXAM: DG HIP (WITH OR WITHOUT PELVIS) 2-3V LEFT COMPARISON:  None. FINDINGS: Prior bilateral total hip arthroplasties with screw fixed acetabular components. Hardware is in expected alignment. Some heterotopic ossification is noted about the acetabula and proximal femoral sites which can be seen post surgically. No acute periprosthetic fracture or complication is evident. No evidence of hardware failure or loosening. Remaining bones of the pelvis appear intact and congruent with partial ankylosis across the bilateral SI joints. Arcuate lines are contiguous. Degenerative features are present in the lumbar spine. Vascular calcifications and phleboliths noted in the pelvis. Bowel gas pattern is unremarkable. IMPRESSION: 1. Prior bilateral total hip arthroplasties without evidence of hardware complication. 2. No acute osseous abnormality. 3. Partial ankylosis across the SI joints. Electronically Signed   By: Kreg Shropshire M.D.   On: 07/28/2020 18:58   ASSESSMENT AND PLAN:  64 y.o. male with a known history of asthma,  dyslipidemia, hypertension, as well as leukopenia with neutropenia, admitted for acute left L4 radicular 50 with weakness of the left  quadriceps and difficulty walking.  1.  L3-L4 and L4-L5 disc protrusion with associated left significant neuroforaminal narrowing and radiculopathy with subsequent left leg weakness and left lower back pain. -Dr. Marcell Barlow planning surgical intervention today on 12/4 considering profound weakness and not much response to steroids -Patient is cleared for surgery from medical perspective.  I have discussed this with Dr. Marcell Barlow  2.  Acute kidney injury likely prerenal secondary to volume depletion and dehydration.  He has hyperkalemia likely secondary to his acute kidney injury and ACE inhibitor therapy. -Improving with hydration, hold off his Zestril.  3.  Essential hypertension. -Zestril held off and the patient will be placed on as needed IV labetalol.  4.  Asthma. -continue his inhalers including as needed albuterol and scheduled Advair Diskus.   Body mass index is 25.84 kg/m.      Status is: Inpatient  Remains inpatient appropriate because:Ongoing active pain requiring inpatient pain management   Dispo: The patient is from: Home              Anticipated d/c is to: Home              Anticipated d/c date is: 2 days              Patient currently is not medically stable to d/c.  Undergoing spine surgery today on 12/4   DVT prophylaxis:       SCDs Start: 07/28/20 1943     Family Communication:  "discussed with patient"   All the records are reviewed and case discussed with Care Management/Social Worker. Management plans discussed with the patient, neurosurgery, nursing and they are in agreement.  CODE STATUS: Full Code  TOTAL TIME TAKING CARE OF THIS PATIENT: 35 minutes.   More than 50% of the time was spent in counseling/coordination of care: YES  POSSIBLE D/C IN 2-3 DAYS, DEPENDING ON CLINICAL CONDITION.   Delfino Lovett M.D on 07/29/2020 at 2:52 PM  [facility]   CC: Primary care physician; Marjie Skiff, NP  Note: This dictation was prepared  with Dragon dictation along with smaller phrase technology. Any transcriptional errors that result from this process are unintentional.

## 2020-07-29 NOTE — Op Note (Addendum)
Indications: The patient is a 64 yo male who presented with lumbar radiculopathy with weakness in his left quadriceps muscle.  He was unable to walk due to weakness.  Findings: far lateral disc herniation at L4/5, L3-4 disc herniation.  Preoperative Diagnosis: Lumbar radiculopathy, left leg weakness Postoperative Diagnosis: same   EBL: 40 ml IVF: 1200 ml Drains: none Disposition: Extubated and Stable to PACU Complications: none  No foley catheter was placed.   Preoperative Note:   Risks of surgery discussed include: infection, bleeding, stroke, coma, death, paralysis, CSF leak, nerve/spinal cord injury, numbness, tingling, weakness, complex regional pain syndrome, recurrent stenosis and/or disc herniation, vascular injury, development of instability, neck/back pain, need for further surgery, persistent symptoms, development of deformity, and the risks of anesthesia. The patient understood these risks and agreed to proceed.  Operative Note:    The patient was then brought from the preoperative center with intravenous access established.  The patient underwent general anesthesia and endotracheal tube intubation, and was then rotated on the Cosby rail top where all pressure points were appropriately padded.  The skin was then thoroughly cleansed.  Perioperative antibiotic prophylaxis was administered.  Sterile prep and drapes were then applied and a timeout was then observed.  C-arm was brought into the field under sterile conditions, and the L4/5 disc space identified and marked with an incision on the left 3cm lateral to midline.   Once this was complete a 2 cm incision was opened with the use of a #10 blade knife.  The Metrx tubes were sequentially advanced under lateral fluoroscopy until a 18 x 70 mm Metrx tube was placed over the left L4 pedicle and secured to the bed.    The microscope was then sterilely brought into the field and muscle creep was hemostased with a bipolar and  resected with a pituitary rongeur.  A Bovie extender was then used to expose the L5 transverse process and lateral facet.  Careful attention was placed to not violate the facet capsule. A 3 mm matchstick drill bit was then used to drill off the top of the L5 transverse process, lateral L4/5 facet, and lateral pars.  The intertransverse membrane was identified, and carefully dissected free from the caudal transverse process.    Using careful dissected, the superior aspect of the L L5 pedicle was identified and exposed down to its junction with the vertebral body.  The disc was identified. The disc was entered using a small Penfield 4. The L4 nerve root was identified and freed using a Penfield 4 and balltip probe.  The disc herniation was identified and dissected free using a balltip probe. The pituitary rongeur was used to remove the extruded disc fragments. Once the nerve root were noted to be relaxed and under less tension the ball-tipped feeler was passed along the foramen proximally to to ensure no residual compression was noted.    A Depo-Medrol soaked Gelfoam pledget was placed along the nerve root for 2 minutes and removed.  The area was irrigated. The tube system was then removed under microscopic visualization and hemostasis was obtained with a bipolar.    After performing the decompression at L4-5, we localized at L3-4 for a traditional microdiscectomy.  An incision was made. The metrx tubes were sequentially advanced and confirmed in position at L3-4 on the left. An 25mm by 27mm tube was locked in place to the bed side attachment.  Fluoroscopy was then removed from the field.  The microscope was then sterilely brought into the  field and muscle creep was hemostased with a bipolar and resected with a pituitary rongeur.  A Bovie extender was then used to expose the spinous process and lamina.  Careful attention was placed to not violate the facet capsule. A 3 mm matchstick drill bit was then used to  make a hemi-laminotomy trough until the ligamentum flavum was exposed.  This was extended to the base of the spinous process and to the contralateral side to remove all the central bone from each side.  Once this was complete and the underlying ligamentum flavum was visualized, it was dissected with a curette and resected with Kerrison rongeurs.   The dura was identified and palpated. The kerrison rongeur was then used to remove the medial facet until no compression was noted.  The L3-4 disc herniation was noted, dissected free, and removed.   A balltip probe was used to confirm decompression of the ipsilateral L4 nerve root.  A Depo-Medrol soaked Gelfoam pledget was placed in the defect.  The wound was copiously irrigated. The tube system was then removed under microscopic visualization and hemostasis was obtained with a bipolar.    No CSF leak was encountered.  On each incision, the fascial layer was reapproximated with the use of a 0- Vicryl suture.  Subcutaneous tissue layer was reapproximated using 2-0 Vicryl suture.  3-0 monocryl was used on the skin. The skin was then cleansed and Dermabond was used to close the skin opening.  Patient was then rotated back to the preoperative bed awakened from anesthesia and taken to recovery all counts are correct in this case.   Venetia Night MD

## 2020-07-29 NOTE — Interval H&P Note (Signed)
History and Physical Interval Note:  07/29/2020 1:03 PM  David Quinn  has presented today for surgery, with the diagnosis of two herniated discs.  The various methods of treatment have been discussed with the patient and family. After consideration of risks, benefits and other options for treatment, the patient has consented to  Procedure(s): LUMBAR LAMINECTOMY/DECOMPRESSION MICRODISCECTOMY 2 LEVELS (Left) as a surgical intervention.  The patient's history has been reviewed, patient examined, no change in status, stable for surgery.  I have reviewed the patient's chart and labs.  Questions were answered to the patient's satisfaction.     Shenandoah Vandergriff

## 2020-07-30 DIAGNOSIS — D72828 Other elevated white blood cell count: Secondary | ICD-10-CM

## 2020-07-30 LAB — CBC
HCT: 37.9 % — ABNORMAL LOW (ref 39.0–52.0)
Hemoglobin: 12.2 g/dL — ABNORMAL LOW (ref 13.0–17.0)
MCH: 26.6 pg (ref 26.0–34.0)
MCHC: 32.2 g/dL (ref 30.0–36.0)
MCV: 82.8 fL (ref 80.0–100.0)
Platelets: 295 10*3/uL (ref 150–400)
RBC: 4.58 MIL/uL (ref 4.22–5.81)
RDW: 13.2 % (ref 11.5–15.5)
WBC: 14.5 10*3/uL — ABNORMAL HIGH (ref 4.0–10.5)
nRBC: 0 % (ref 0.0–0.2)

## 2020-07-30 LAB — BASIC METABOLIC PANEL
Anion gap: 7 (ref 5–15)
BUN: 31 mg/dL — ABNORMAL HIGH (ref 8–23)
CO2: 25 mmol/L (ref 22–32)
Calcium: 8.5 mg/dL — ABNORMAL LOW (ref 8.9–10.3)
Chloride: 104 mmol/L (ref 98–111)
Creatinine, Ser: 1.09 mg/dL (ref 0.61–1.24)
GFR, Estimated: >60 mL/min (ref >60)
Glucose, Bld: 122 mg/dL — ABNORMAL HIGH (ref 70–99)
Potassium: 4.8 mmol/L (ref 3.5–5.1)
Sodium: 136 mmol/L (ref 135–145)

## 2020-07-30 MED ORDER — DOCUSATE SODIUM 100 MG PO CAPS
200.0000 mg | ORAL_CAPSULE | Freq: Two times a day (BID) | ORAL | Status: DC
  Administered 2020-07-30 – 2020-07-31 (×3): 200 mg via ORAL
  Filled 2020-07-30 (×7): qty 2

## 2020-07-30 NOTE — Progress Notes (Signed)
PROGRESS NOTE    David Quinn  XBL:[id number] DOB: 12-May-1956 DOA: 07/28/2020 PCP: Marjie Skiff, NP   Assessment & Plan:   Active Problems:   Lumbar disc prolapse with compression radiculopathy   L3-L4 and L4-L5 disc protrusion: w/ associated left significant neuroforaminal narrowing and radiculopathy with subsequent left leg weakness and left lower back pain. S/p lumbar laminectomy/decompression microdiscectomy 2 levels on 07/29/20 as per neurosurg. Continue on IV decadron as per neuro surg. Morphine prn for pain   Leukocytosis: likely reactive. Will continue to monitor   AKI: likely secondary to dehydration. Continue on IVFs. Cr is trending down from day prior. Continue to hold home dose of ACE-I.   HTN: continue to hold home dose of zestril. IV labetalol prn   Asthma: severity unknown. Continue on bronchodilators. Encourage incentive spirometry    DVT prophylaxis: SCDs Code Status: full  Family Communication:  Disposition Plan: PT recs SNF    Status is: Inpatient  Remains inpatient appropriate because:Unsafe d/c plan, IV treatments appropriate due to intensity of illness or inability to take PO and Inpatient level of care appropriate due to severity of illness   Dispo: The patient is from: Home              Anticipated d/c is to: SNF              Anticipated d/c date is: 2 days              Patient currently is not medically stable to d/c.        Consultants:   Neuro surg    Procedures:    Antimicrobials:    Subjective: Pt c/o intermittent back pain   Objective: Vitals:   07/29/20 2225 07/30/20 0033 07/30/20 0350 07/30/20 0802  BP: (!) 137/96 (!) 127/93 122/73 113/77  Pulse: 77 67 74 73  Resp: 16 15 16 16   Temp: 97.8 F (36.6 C) 97.8 F (36.6 C) 97.9 F (36.6 C) 98.3 F (36.8 C)  TempSrc:  Oral    SpO2: 100% 96% 97% 97%  Weight:      Height:        Intake/Output Summary (Last 24 hours) at 07/30/2020 0817 Last data filed at  07/30/2020 0400 Gross per 24 hour  Intake 1210 ml  Output 740 ml  Net 470 ml   Filed Weights   07/28/20 1224  Weight: 74.8 kg    Examination:  General exam: Appears calm and comfortable  Respiratory system: Clear to auscultation. Respiratory effort normal. Cardiovascular system: S1 & S2 +. No rubs, gallops or clicks. Gastrointestinal system: Abdomen is nondistended, soft and nontender. Normal bowel sounds heard. Central nervous system: Alert and oriented. Moves all 4 extremities  Psychiatry: Judgement and insight appear normal. Mood & affect appropriate.     Data Reviewed: I have personally reviewed following labs and imaging studies  CBC: Recent Labs  Lab 07/28/20 1931 07/29/20 0449 07/30/20 0609  WBC 6.4 4.4 14.5*  HGB 14.8 13.2 12.2*  HCT 45.9 41.2 37.9*  MCV 82.9 82.6 82.8  PLT 341 309 295   Basic Metabolic Panel: Recent Labs  Lab 07/28/20 1931 07/29/20 0449 07/30/20 0609  NA 138 139 136  K 5.7* 4.3 4.8  CL 101 105 104  CO2 24 24 25   GLUCOSE 91 150* 122*  BUN 42* 35* 31*  CREATININE 1.42* 1.16 1.09  CALCIUM 9.3 9.1 8.5*   GFR: Estimated Creatinine Clearance: 64 mL/min (by C-G formula based on SCr of 1.09  mg/dL). Liver Function Tests: Recent Labs  Lab 07/28/20 1931  AST 37  ALT 23  ALKPHOS 73  BILITOT 1.0  PROT 8.5*  ALBUMIN 3.9   No results for input(s): LIPASE, AMYLASE in the last 168 hours. No results for input(s): AMMONIA in the last 168 hours. Coagulation Profile: No results for input(s): INR, PROTIME in the last 168 hours. Cardiac Enzymes: No results for input(s): CKTOTAL, CKMB, CKMBINDEX, TROPONINI in the last 168 hours. BNP (last 3 results) No results for input(s): PROBNP in the last 8760 hours. HbA1C: No results for input(s): HGBA1C in the last 72 hours. CBG: No results for input(s): GLUCAP in the last 168 hours. Lipid Profile: No results for input(s): CHOL, HDL, LDLCALC, TRIG, CHOLHDL, LDLDIRECT in the last 72 hours. Thyroid  Function Tests: No results for input(s): TSH, T4TOTAL, FREET4, T3FREE, THYROIDAB in the last 72 hours. Anemia Panel: No results for input(s): VITAMINB12, FOLATE, FERRITIN, TIBC, IRON, RETICCTPCT in the last 72 hours. Sepsis Labs: No results for input(s): PROCALCITON, LATICACIDVEN in the last 168 hours.  Recent Results (from the past 240 hour(s))  Resp Panel by RT-PCR (Flu A&B, Covid) Nasopharyngeal Swab     Status: None   Collection Time: 07/28/20  7:31 PM   Specimen: Nasopharyngeal Swab; Nasopharyngeal(NP) swabs in vial transport medium  Result Value Ref Range Status   SARS Coronavirus 2 by RT PCR NEGATIVE NEGATIVE Final    Comment: (NOTE) SARS-CoV-2 target nucleic acids are NOT DETECTED.  The SARS-CoV-2 RNA is generally detectable in upper respiratory specimens during the acute phase of infection. The lowest concentration of SARS-CoV-2 viral copies this assay can detect is 138 copies/mL. A negative result does not preclude SARS-Cov-2 infection and should not be used as the sole basis for treatment or other patient management decisions. A negative result may occur with  improper specimen collection/handling, submission of specimen other than nasopharyngeal swab, presence of viral mutation(s) within the areas targeted by this assay, and inadequate number of viral copies(<138 copies/mL). A negative result must be combined with clinical observations, patient history, and epidemiological information. The expected result is Negative.  Fact Sheet for Patients:  BloggerCourse.com  Fact Sheet for Healthcare Providers:  SeriousBroker.it  This test is no t yet approved or cleared by the [place] FDA and  has been authorized for detection and/or diagnosis of SARS-CoV-2 by FDA under an Emergency Use Authorization (EUA). This EUA will remain  in effect (meaning this test can be used) for the duration of the COVID-19 declaration under  Section 564(b)(1) of the Act, 21 U.S.C.section 360bbb-3(b)(1), unless the authorization is terminated  or revoked sooner.       Influenza A by PCR NEGATIVE NEGATIVE Final   Influenza B by PCR NEGATIVE NEGATIVE Final    Comment: (NOTE) The Xpert Xpress SARS-CoV-2/FLU/RSV plus assay is intended as an aid in the diagnosis of influenza from Nasopharyngeal swab specimens and should not be used as a sole basis for treatment. Nasal washings and aspirates are unacceptable for Xpert Xpress SARS-CoV-2/FLU/RSV testing.  Fact Sheet for Patients: BloggerCourse.com  Fact Sheet for Healthcare Providers: SeriousBroker.it  This test is not yet approved or cleared by the [place] FDA and has been authorized for detection and/or diagnosis of SARS-CoV-2 by FDA under an Emergency Use Authorization (EUA). This EUA will remain in effect (meaning this test can be used) for the duration of the COVID-19 declaration under Section 564(b)(1) of the Act, 21 U.S.C. section 360bbb-3(b)(1), unless the authorization is terminated or revoked.  Performed at [facility], [address] [place], [place] [address]          Radiology Studies: DG Lumbar Spine 2-3 Views  Result Date: 07/29/2020 CLINICAL DATA:  Lumbar laminectomy/decompression. EXAM: LUMBAR SPINE - 2-3 VIEW COMPARISON:  None. FINDINGS: Three fluoroscopic images were obtained. On the first fluoroscopic image, a surgical instrument is seen posteriorly at the L5 level. The second image is an AP image with a marker to the right of L4 and a surgical device over L3. On the final image, there is a the posterior surgical marker. The level cannot be identified with complete confidence but I believe identified level is likely L3. IMPRESSION: Fluoroscopic images during surgery as above. Electronically Signed   By: Gerome Samavid  Whisper Kurka III M.D   On: 07/29/2020 16:05   DG Tibia/Fibula Left  Result  Date: 07/28/2020 CLINICAL DATA:  Left leg pain EXAM: LEFT TIBIA AND FIBULA - 2 VIEW COMPARISON:  None. FINDINGS: Frontal and lateral views of the left tibia and fibula demonstrate no fractures. Alignment is anatomic. Medial and lateral compartmental osteoarthritis within the left knee, with extensive chondrocalcinosis. Mild osteoarthritis of the tibiotalar joint. Soft tissues are unremarkable. IMPRESSION: 1. Osteoarthritis of the left knee and ankle. No acute bony abnormality. Electronically Signed   By: Sharlet SalinaMichael  Brown M.D.   On: 07/28/2020 18:55   CT Head Wo Contrast  Result Date: 07/28/2020 CLINICAL DATA:  Neuro deficit, stroke suspected EXAM: CT HEAD WITHOUT CONTRAST TECHNIQUE: Contiguous axial images were obtained from the base of the skull through the vertex without intravenous contrast. COMPARISON:  None. FINDINGS: Brain: No evidence of acute infarction, hemorrhage, hydrocephalus, extra-axial collection, visible mass lesion or mass effect. Parenchymal volume is likely normal for age. Midline structures are normal. Basal cisterns are patent. Cerebellar tonsils are normally position. Vascular: Atherosclerotic calcification of the carotid siphons. No hyperdense vessel. Skull: No calvarial fracture or suspicious osseous lesion. No scalp swelling or hematoma. Sinuses/Orbits: Paranasal sinuses and mastoid air cells are predominantly clear. Included orbital structures are unremarkable. Other: None IMPRESSION: 1. No acute intracranial abnormality. If there is persisting clinical concern for infarct, MRI is more sensitive and specific for early changes of ischemia. Electronically Signed   By: Kreg ShropshirePrice  DeHay M.D.   On: 07/28/2020 18:24   MR LUMBAR SPINE WO CONTRAST  Result Date: 07/28/2020 CLINICAL DATA:  Low back pain, progressive neurologic deficit. Additional history provided: Patient reports hip pain, progressively worsening and radiating into left knee. EXAM: MRI LUMBAR SPINE WITHOUT CONTRAST TECHNIQUE:  Multiplanar, multisequence MR imaging of the lumbar spine was performed. No intravenous contrast was administered. COMPARISON:  Lumbar spine MRI 01/04/2011 FINDINGS: Segmentation: For the purposes of this dictation, five lumbar vertebrae are assumed and the caudal most well-formed intervertebral disc is designated L5-S1. Alignment:  Mild L1-L2, L2-L3 and L5-S1 grade 1 retrolisthesis. Vertebrae: Vertebral body height is maintained. Moderate marrow edema is present within the left L4 and L5 pedicles and articular pillars as well as L4 spinous process. This edema is likely degenerative in the absence of any signs or symptoms of infection. Conus medullaris and cauda equina: Conus extends to the T12-L1 level. No signal abnormality within the visualized distal spinal cord. Paraspinal and other soft tissues: Small T2 hyperintense renal lesions bilaterally, incompletely assessed but possibly reflecting cysts. The partially imaged urinary bladder is distended paraspinal soft tissues within normal limits. Disc levels: Unless otherwise stated, the level by level findings below have not significantly changed since prior MRI 01/04/2011. Progressive moderate L5-S1 disc  degeneration. No more than mild disc degeneration at the remaining levels. T11-T12: This level is imaged sagittally. Small disc bulge. No significant spinal canal or foraminal stenosis. T12-L1: Small disc bulge. No significant spinal canal or foraminal stenosis. L1-L2: Disc bulge. Superimposed tiny right center disc protrusion. A previously demonstrated left center disc protrusion has regressed and is no longer appreciated. Mild facet arthrosis/ligamentum flavum hypertrophy. Mild bilateral subarticular and central canal narrowing without nerve root impingement. Mild bilateral neural foraminal narrowing, new from the prior exam. L2-L3: Posterior annular fissure. Disc bulge. Mild facet arthrosis/ligamentum flavum hypertrophy. Mild bilateral subarticular and  mild/moderate central canal stenosis, not significantly changed. Bilateral neural foraminal narrowing, unchanged (mild right, moderate left). L3-L4: Progressive disc bulge. New superimposed left subarticular/foraminal disc extrusion. Progressive moderate facet arthrosis with ligamentum flavum hypertrophy. The disc protrusion contributes to moderate left subarticular stenosis, crowding the descending left L4 nerve root. Progressive mild right subarticular and central canal stenosis. Progressive moderate/severe bilateral neural foraminal narrowing. Additionally, there is extruded disc material posterior to the L4 vertebral body on the left (series 5, image 9). It is unclear if this extruded disc material is arising from the L3-L4 or L4-L5 discs and sequestered disc material cannot be excluded. L4-L5: Progressive disc bulge. New left foraminal/extraforaminal disc protrusion (series 8, image 23) (series 5, image 11). Progressive moderate facet arthrosis with ligamentum flavum hypertrophy. Trace left facet joint effusion. Progressive mild left subarticular narrowing without nerve root impingement. Progressive bilateral neural foraminal narrowing (mild right, moderate/severe left). L5-S1: Disc bulge. Redemonstrated broad-based disc extrusion spanning the central and subarticular zones with minimal caudal migration. Progressive moderate facet arthrosis and left greater than right ligamentum flavum hypertrophy. As before, the disc extrusion contributes to moderate/severe left subarticular stenosis, encroaching upon the descending left S1 nerve root. Mild right subarticular and central canal narrowing. No significant foraminal stenosis IMPRESSION: Lumbar spondylosis as outlined and having progressed at several levels since the prior MRI of 01/04/2011. Findings are most notably as follows. At L3-L4, there is a new left subarticular/foraminal disc extrusion which contributes to moderate left subarticular stenosis, crowding the  descending left L4 nerve root. This also contributes to moderate/severe left neural foraminal narrowing. Multifactorial progressive moderate/severe right neural foraminal narrowing. Additionally, there is extruded disc material posterior to the L4 vertebral body on the left. It is unclear if this extruded disc material has arisen from the L3-L4 or L4-L5 discs and sequestered disc material cannot be excluded. At L4-L5, there is a new left foraminal/extraforaminal disc protrusion. This contributes to progressive moderate/severe left neural foraminal narrowing. It also contributes to mild left subarticular narrowing without nerve root impingement. At L5-S1, redemonstrated broad-based central disc extrusion spanning the central and bilateral subarticular zones with minimal caudal migration. As before, the disc extrusion contributes to moderate/severe left subarticular stenosis, encroaching upon the descending left S1 nerve root. Also new from the prior MRI, there is moderate edema within the left L4 and L5 pedicles/articular pillars and L4 spinous process. This edema is likely degenerative in the absence of any signs or symptoms of infection. Trace left L3-L4 and L4-L5 facet joint effusions. Electronically Signed   By: Jackey Loge DO   On: 07/28/2020 16:54   DG C-Arm 1-60 Min-No Report  Result Date: 07/29/2020 Fluoroscopy was utilized by the requesting physician.  No radiographic interpretation.   DG C-Arm 1-60 Min-No Report  Result Date: 07/29/2020 Fluoroscopy was utilized by the requesting physician.  No radiographic interpretation.   DG Hip Unilat W or Wo Pelvis 2-3 Views  Left  Result Date: 07/28/2020 CLINICAL DATA:  Worsening left hip pain now radiating to left knee EXAM: DG HIP (WITH OR WITHOUT PELVIS) 2-3V LEFT COMPARISON:  None. FINDINGS: Prior bilateral total hip arthroplasties with screw fixed acetabular components. Hardware is in expected alignment. Some heterotopic ossification is noted about the  acetabula and proximal femoral sites which can be seen post surgically. No acute periprosthetic fracture or complication is evident. No evidence of hardware failure or loosening. Remaining bones of the pelvis appear intact and congruent with partial ankylosis across the bilateral SI joints. Arcuate lines are contiguous. Degenerative features are present in the lumbar spine. Vascular calcifications and phleboliths noted in the pelvis. Bowel gas pattern is unremarkable. IMPRESSION: 1. Prior bilateral total hip arthroplasties without evidence of hardware complication. 2. No acute osseous abnormality. 3. Partial ankylosis across the SI joints. Electronically Signed   By: Kreg Shropshire M.D.   On: 07/28/2020 18:58        Scheduled Meds: . cholecalciferol  1,000 Units Oral Daily  . dexamethasone (DECADRON) injection  10 mg Intravenous Q6H  . gabapentin  300 mg Oral TID  . influenza vac split quadrivalent PF  0.5 mL Intramuscular Tomorrow-1000  . mometasone-formoterol  2 puff Inhalation BID  . multivitamin with minerals  1 tablet Oral Daily   Continuous Infusions: . sodium chloride 100 mL/hr at 07/30/20 0445     LOS: 2 days    Time spent: 33 mins    Charise Killian, MD [facility] Pager [phone]  If 7PM-7AM, please contact night-coverage 07/30/2020, 8:17 AM

## 2020-07-30 NOTE — Plan of Care (Signed)
  Problem: Education: Goal: Knowledge of General Education information will improve Description Including pain rating scale, medication(s)/side effects and non-pharmacologic comfort measures Outcome: Progressing   Problem: Health Behavior/Discharge Planning: Goal: Ability to manage health-related needs will improve Outcome: Progressing   

## 2020-07-30 NOTE — Plan of Care (Signed)
  Problem: Education: Goal: Knowledge of General Education information will improve Description Including pain rating scale, medication(s)/side effects and non-pharmacologic comfort measures Outcome: Progressing   

## 2020-07-30 NOTE — Evaluation (Signed)
Occupational Therapy Evaluation Patient Details Name: David Quinn MRN: [id number] DOB: December 05, 1955 Today's Date: 07/30/2020    History of Present Illness David Quinn is a 64 y.o. male who presents with the chief complaint of lower back pain, left leg pain, and left leg weakness.  He is s/p lumbar laminectomy on 07/29/20.   Clinical Impression   [name] was seen for OT evaluation this date. Prior to hospital admission, pt was Independent for mobility and I/ADLs. Pt lives c wife in 1 level home c 3 STE. Pt presents to acute OT demonstrating impaired ADL performance and functional mobility 2/2 functional strength/balance deficits and decreased LB access. Pt is pleasant and highly motivated to return to PLOF. Pt currently requires MAX A for LBD at bed level and seated EOB. MIN A sup<>sit - assist for LLE mgmt. MIN A + L knee block for BSC t/f. Pt completed x3 STS trials from [facility] c LLE buckling noted on 3rd attempt. Pt weight shifts but unable to take steps forward/backward at this time. Pt would benefit from skilled OT to address noted impairments and functional limitations (see below for any additional details) in order to maximize safety and independence while minimizing falls risk and caregiver burden. Upon hospital discharge, recommend STR to maximize pt safety and return to PLOF.     Follow Up Recommendations  SNF;Other (comment) (may progress)    Equipment Recommendations  3 in 1 bedside commode    Recommendations for Other Services       Precautions / Restrictions Precautions Precautions: Fall Restrictions Weight Bearing Restrictions: No      Mobility Bed Mobility Overal bed mobility: Needs Assistance Bed Mobility: Supine to Sit;Sit to Supine     Supine to sit: Min assist;HOB elevated Sit to supine: Min assist;HOB elevated        Transfers Overall transfer level: Needs assistance   Transfers: Sit to/from [employer] Transfers Sit to Stand: Min  assist Stand pivot transfers: Min assist       General transfer comment: MIN A + L knee block + VCs for sequencing    Balance Overall balance assessment: Needs assistance Sitting-balance support: No upper extremity supported;Feet supported Sitting balance-Leahy Scale: Good     Standing balance support: Bilateral upper extremity supported Standing balance-Leahy Scale: Poor Standing balance comment: Pt requires B UE support due to LLE buckling.      ADL either performed or assessed with clinical judgement   ADL Overall ADL's : Needs assistance/impaired         General ADL Comments: MAX A for LBD at bed level and seated EOB. MIN A + L knee block for BSC t/f.                   Pertinent Vitals/Pain Pain Assessment: No/denies pain     Hand Dominance     Extremity/Trunk Assessment Upper Extremity Assessment Upper Extremity Assessment: Overall WFL for tasks assessed   Lower Extremity Assessment Lower Extremity Assessment: LLE deficits/detail LLE Deficits / Details: 2/5 grossly LLE Sensation: decreased light touch LLE Coordination: WNL       Communication Communication Communication: No difficulties   Cognition Arousal/Alertness: Awake/alert Behavior During Therapy: WFL for tasks assessed/performed Overall Cognitive Status: Within Functional Limits for tasks assessed          General Comments       Exercises Exercises: Other exercises Other Exercises Other Exercises: Pt educated re: OT role, DME recs, d/c recs, falls prevention, ECS, HEP, importance of  mobility for functional strengthening Other Exercises: LBD, BSC t/f, sup<>sit, sit<>stand x3, sitting/standing balance/tolerance   Shoulder Instructions      Home Living Family/patient expects to be discharged to:: Private residence Living Arrangements: Spouse/significant other Available Help at Discharge: Family Type of Home: House Home Access: Stairs to enter Secretary/administrator of Steps:  3 Entrance Stairs-Rails: None Home Layout: One level     Bathroom Shower/Tub: Chief Strategy Officer: Handicapped height Bathroom Accessibility: Yes   Home Equipment: None          Prior Functioning/Environment Level of Independence: Independent                 OT Problem List: Decreased strength;Decreased activity tolerance;Impaired balance (sitting and/or standing)      OT Treatment/Interventions: Self-care/ADL training;Therapeutic exercise;Energy conservation;DME and/or AE instruction;Therapeutic activities;Patient/family education;Balance training    OT Goals(Current goals can be found in the care plan section) Acute Rehab OT Goals Patient Stated Goal: Get Stronger and be able to walk again. OT Goal Formulation: With patient Time For Goal Achievement: 08/13/20 Potential to Achieve Goals: Good ADL Goals Pt Will Perform Grooming: standing;with supervision (c LRAD PRN) Pt Will Perform Lower Body Dressing: sit to/from stand;with min guard assist (c LRAD PRN) Pt Will Transfer to Toilet: with modified independence;bedside commode;ambulating (c LRAD PRN)  OT Frequency: Min 2X/week    AM-PAC OT "6 Clicks" Daily Activity     Outcome Measure Help from another person eating meals?: None Help from another person taking care of personal grooming?: A Little Help from another person toileting, which includes using toliet, bedpan, or urinal?: A Little Help from another person bathing (including washing, rinsing, drying)?: A Little Help from another person to put on and taking off regular upper body clothing?: None Help from another person to put on and taking off regular lower body clothing?: A Lot 6 Click Score: 19   End of Session Nurse Communication: Mobility status  Activity Tolerance: Patient tolerated treatment well Patient left: in bed;with call bell/phone within reach;with bed alarm set;with family/visitor present  OT Visit Diagnosis: Other abnormalities  of gait and mobility (R26.89)                Time: 3875-6433 OT Time Calculation (min): 24 min Charges:  OT General Charges $OT Visit: 1 Visit OT Evaluation $OT Eval Low Complexity: 1 Low OT Treatments $Self Care/Home Management : 8-22 mins $Therapeutic Activity: 8-22 mins   Kathie Dike, M.S. OTR/L  07/30/20, 4:20 PM  ascom [phone]

## 2020-07-30 NOTE — Progress Notes (Signed)
    Attending Progress Note  History: David Quinn is here for acute L L4 radiculopathy due to acute disc herniation.  POD1: David Quinn has little pain.  His sensation is similar.  No new issues.  Physical Exam: Vitals:   07/30/20 0350 07/30/20 0802  BP: 122/73 113/77  Pulse: 74 73  Resp: 16 16  Temp: 97.9 F (36.6 C) 98.3 F (36.8 C)  SpO2: 97% 97%    AA Ox3 CNI  Strength:5/5 throughout BLE excent L quadriceps, which is 1-2/5.  Incision c/d/i  Data:  Recent Labs  Lab 07/28/20 1931 07/28/20 1931 07/29/20 0449 07/29/20 0449 07/30/20 0609  NA 138   < > 139   < > 136  K 5.7*   < > 4.3   < > 4.8  CL 101   < > 105   < > 104  CO2 24   < > 24   < > 25  BUN 42*   < > 35*   < > 31*  CREATININE 1.42*   < > 1.16   < > 1.09  GLUCOSE 91   < > 150*   < > 122*  CALCIUM 9.3  --  9.1  --  8.5*   < > = values in this interval not displayed.   Recent Labs  Lab 07/28/20 1931  AST 37  ALT 23  ALKPHOS 73     Recent Labs  Lab 07/28/20 1931 07/28/20 1931 07/29/20 0449 07/29/20 0449 07/30/20 0609  WBC 6.4   < > 4.4   < > 14.5*  HGB 14.8   < > 13.2   < > 12.2*  HCT 45.9   < > 41.2   < > 37.9*  PLT 341  --  309  --  295   < > = values in this interval not displayed.   No results for input(s): APTT, INR in the last 168 hours.       Other tests/results: none  Assessment/Plan:  David Quinn is doing well postop L3-4 microdiscectomy, L4-5 far lateral discectomy  - mobilize - pain control - DVT prophylaxis - PTOT   Venetia Night MD, [facility] Department of Neurosurgery

## 2020-07-30 NOTE — Evaluation (Addendum)
Physical Therapy Evaluation Patient Details Name: David Quinn MRN: [id number] DOB: Oct 20, 1955 Today's Date: 07/30/2020   History of Present Illness  BJORN HALLAS is a 64 y.o. male who presents with the chief complaint of lower back pain, left leg pain, and left leg weakness.  He is s/p lumbar laminectomy on 07/29/20.     Clinical Impression  Pt received in Semi-Fowler's position and agreeable to therapy.  Pt notes that his LLE is difficult to move due to the surgery and injury prior to surgical intervention.  Pt was able to perform bed-level exercises, however needed assistance from therapist for the LLE in order to complete.  Pt at times contracting hamstrings when hip flexors should be firing for SLR.  Pt received tactile cuing for quad activation, however it was still difficult for him to perform.  Pt performed bed mobility with good technique and utilized log rolling that was verbally explained and tactile cues for proper movement.  Pt then transferred to seated EOB.  Pt able to stand with good technique needing verbal cues for hand placement.  Pt required CGA due knee buckling, however was able to pivot transfer and perform modified marches in standing in order to go to the chair.  Pt transferred to the chair safely and was given call bell and all necessary items.  Pt would benefit from SNF at this time due to LLE weakness and functional deficits explained above.      Follow Up Recommendations SNF    Equipment Recommendations  Rolling walker with 5" wheels    Recommendations for Other Services       Precautions / Restrictions Precautions Precautions: None Restrictions Weight Bearing Restrictions: No      Mobility  Bed Mobility Overal bed mobility: Modified Independent             General bed mobility comments: increased time needed.    Transfers Overall transfer level: Needs assistance Equipment used: Rolling walker (2 wheeled) Transfers: Sit to/from  Stand Sit to Stand: Min assist         General transfer comment: Pt able to navigate STS with use of verbal cuing for hand placement, and CGA.  Ambulation/Gait Ambulation/Gait assistance: Min assist Gait Distance (Feet): 3 Feet Assistive device: Rolling walker (2 wheeled) Gait Pattern/deviations: Step-to pattern;Decreased step length - right;Decreased stance time - left;Decreased stride length Gait velocity: decreased   General Gait Details: Pt experiences buckling of the LLE when attempting to take step with the RLE.  Stairs            Wheelchair Mobility    Modified Rankin (Stroke Patients Only)       Balance Overall balance assessment: Needs assistance Sitting-balance support: Feet supported Sitting balance-Leahy Scale: Good     Standing balance support: Bilateral upper extremity supported Standing balance-Leahy Scale: Fair Standing balance comment: Pt requires B UE support due to LLE buckling.                             Pertinent Vitals/Pain Pain Assessment: No/denies pain    Home Living Family/patient expects to be discharged to:: Private residence Living Arrangements: Spouse/significant other Available Help at Discharge: Family Type of Home: House Home Access: Stairs to enter Entrance Stairs-Rails: None Entrance Stairs-Number of Steps: 3 Home Layout: One level Home Equipment: None      Prior Function Level of Independence: Independent  Hand Dominance        Extremity/Trunk Assessment   Upper Extremity Assessment Upper Extremity Assessment: Defer to OT evaluation    Lower Extremity Assessment Lower Extremity Assessment: Generalized weakness;LLE deficits/detail LLE Deficits / Details: Pt reports decreased sensation and tingling in the LLE, along with weakness in the LLE as well. LLE Sensation: decreased light touch LLE Coordination: WNL       Communication   Communication: No difficulties  Cognition  Arousal/Alertness: Awake/alert Behavior During Therapy: WFL for tasks assessed/performed Overall Cognitive Status: Within Functional Limits for tasks assessed                                        General Comments      Exercises Total Joint Exercises Ankle Circles/Pumps: AROM;Strengthening;Both;10 reps;Supine Quad Sets: AROM;Strengthening;Both;10 reps;Supine Gluteal Sets: AROM;Strengthening;Both;10 reps;Supine Hip ABduction/ADduction: AROM;Strengthening;Both;10 reps;Supine Straight Leg Raises: AROM;Right;AAROM;Left;Strengthening;10 reps;Supine Long Arc Quad: AROM;Strengthening;Both;10 reps;Seated Marching in Standing: AROM;Strengthening;Both;10 reps;Standing Other Exercises Other Exercises: Pt educated on roles of Pt and services provided during hospital stay. Other Exercises: Pt edcuated on use of FWW and how to utilize DMe for safe navigation during ambulation.   Assessment/Plan    PT Assessment Patient needs continued PT services  PT Problem List Decreased strength;Decreased activity tolerance;Decreased balance;Decreased mobility;Decreased knowledge of use of DME;Decreased safety awareness       PT Treatment Interventions DME instruction;Gait training;Functional mobility training;Therapeutic activities;Therapeutic exercise;Balance training    PT Goals (Current goals can be found in the Care Plan section)  Acute Rehab PT Goals Patient Stated Goal: Get Stronger and be able to walk again. PT Goal Formulation: With patient Time For Goal Achievement: 08/13/20 Potential to Achieve Goals: Good    Frequency BID   Barriers to discharge        Co-evaluation               AM-PAC PT "6 Clicks" Mobility  Outcome Measure Help needed turning from your back to your side while in a flat bed without using bedrails?: A Little Help needed moving from lying on your back to sitting on the side of a flat bed without using bedrails?: A Little Help needed moving to  and from a bed to a chair (including a wheelchair)?: A Little Help needed standing up from a chair using your arms (e.g., wheelchair or bedside chair)?: A Little Help needed to walk in hospital room?: A Lot Help needed climbing 3-5 steps with a railing? : Total 6 Click Score: 15    End of Session Equipment Utilized During Treatment: Gait belt Activity Tolerance: Patient tolerated treatment well Patient left: in chair;with call bell/phone within reach;with chair alarm set Nurse Communication: Mobility status PT Visit Diagnosis: Unsteadiness on feet (R26.81);Other abnormalities of gait and mobility (R26.89);Muscle weakness (generalized) (M62.81);Difficulty in walking, not elsewhere classified (R26.2)    Time: 7829-5621 PT Time Calculation (min) (ACUTE ONLY): 54 min   Charges:   PT Evaluation $PT Eval Low Complexity: 1 Low PT Treatments $Gait Training: 8-22 mins $Therapeutic Exercise: 23-37 mins $Therapeutic Activity: 8-22 mins        Nolon Bussing, PT, DPT 07/30/20, 12:33 PM

## 2020-07-30 NOTE — Anesthesia Postprocedure Evaluation (Signed)
Anesthesia Post Note  Patient: David Quinn  Procedure(s) Performed: LUMBAR LAMINECTOMY/DECOMPRESSION MICRODISCECTOMY 2 LEVELS (Left Back)  Patient location during evaluation: PACU Anesthesia Type: General Level of consciousness: awake and alert Pain management: pain level controlled Vital Signs Assessment: post-procedure vital signs reviewed and stable Respiratory status: spontaneous breathing, nonlabored ventilation, respiratory function stable and patient connected to nasal cannula oxygen Cardiovascular status: blood pressure returned to baseline and stable Postop Assessment: no apparent nausea or vomiting Anesthetic complications: no   No complications documented.   Last Vitals:  Vitals:   07/30/20 0350 07/30/20 0802  BP: 122/73 113/77  Pulse: 74 73  Resp: 16 16  Temp: 36.6 C 36.8 C  SpO2: 97% 97%    Last Pain:  Vitals:   07/30/20 0033  TempSrc: Oral  PainSc:                  Lenard Simmer

## 2020-07-31 ENCOUNTER — Encounter: Payer: Self-pay | Admitting: Neurosurgery

## 2020-07-31 LAB — BASIC METABOLIC PANEL
Anion gap: 7 (ref 5–15)
BUN: 30 mg/dL — ABNORMAL HIGH (ref 8–23)
CO2: 24 mmol/L (ref 22–32)
Calcium: 8.6 mg/dL — ABNORMAL LOW (ref 8.9–10.3)
Chloride: 106 mmol/L (ref 98–111)
Creatinine, Ser: 1.03 mg/dL (ref 0.61–1.24)
GFR, Estimated: >60 mL/min (ref >60)
Glucose, Bld: 127 mg/dL — ABNORMAL HIGH (ref 70–99)
Potassium: 4.7 mmol/L (ref 3.5–5.1)
Sodium: 137 mmol/L (ref 135–145)

## 2020-07-31 LAB — CBC
HCT: 37.5 % — ABNORMAL LOW (ref 39.0–52.0)
Hemoglobin: 12.5 g/dL — ABNORMAL LOW (ref 13.0–17.0)
MCH: 27.3 pg (ref 26.0–34.0)
MCHC: 33.3 g/dL (ref 30.0–36.0)
MCV: 81.9 fL (ref 80.0–100.0)
Platelets: 283 10*3/uL (ref 150–400)
RBC: 4.58 MIL/uL (ref 4.22–5.81)
RDW: 13.2 % (ref 11.5–15.5)
WBC: 12.2 10*3/uL — ABNORMAL HIGH (ref 4.0–10.5)
nRBC: 0 % (ref 0.0–0.2)

## 2020-07-31 MED ORDER — OXYCODONE-ACETAMINOPHEN 5-325 MG PO TABS: 1.0000 | ORAL_TABLET | Freq: Four times a day (QID) | ORAL | Status: DC | PRN

## 2020-07-31 MED ORDER — LISINOPRIL 10 MG PO TABS
10.0000 mg | ORAL_TABLET | Freq: Every day | ORAL | Status: DC
  Administered 2020-07-31 – 2020-08-02 (×3): 10 mg via ORAL
  Filled 2020-07-31 (×3): qty 1

## 2020-07-31 NOTE — Progress Notes (Signed)
Occupational Therapy Treatment Patient Details Name: David Quinn MRN: [id number] DOB: 1955/12/16 Today's Date: 07/31/2020    History of present illness David Quinn is a 64 y.o. male who presents with the chief complaint of lower back pain, left leg pain, and left leg weakness.  He is s/p lumbar laminectomy on 07/29/20.   OT comments  David Quinn was seen for OT treatment on this date. Upon arrival to room pt seated up in chair c lunch completed and agreeable to session. Pt is pleasant and motivated, reports pleased with his progress this date. Pt completed LBD requiring VCs and AE (reacher) to don pants in sitting, CGA + RW to pull up over rear in standing. Pt reports having sock aid, reacher, and long handled shoe horn at home. CGA + RW hand washing standing sinkside. Pt completed standing therex and balance exercises c BUE support progressing to single UE support. Pt making good progress toward goals. Pt continues to benefit from skilled OT services to maximize return to PLOF and minimize risk of future falls, injury, caregiver burden, and readmission. Will continue to follow POC. Discharge recommendation updated to reflect progress.    Follow Up Recommendations  Home health OT    Equipment Recommendations  3 in 1 bedside commode    Recommendations for Other Services      Precautions / Restrictions Precautions Precautions: Fall;Back Restrictions Weight Bearing Restrictions: No       Mobility Bed Mobility    General bed mobility comments: seated in recliner beginning/end of treatment session  Transfers Overall transfer level: Needs assistance Equipment used: Rolling walker (2 wheeled) Transfers: Sit to/from Stand Sit to Stand: Min guard         General transfer comment: good hand placement; heavy use of UE support for lift off and stabilization; min cuing to maintain L foot contact with floor and actively utilize as able    Balance Overall balance  assessment: Needs assistance Sitting-balance support: No upper extremity supported;Feet supported Sitting balance-Leahy Scale: Good     Standing balance support: No upper extremity supported;During functional activity Standing balance-Leahy Scale: Fair         ADL either performed or assessed with clinical judgement   ADL Overall ADL's : Needs assistance/impaired      General ADL Comments: VCs and AE to don pants in sitting, CGA + RW to pull up over rear in standing. CGA + RW hand washing standing sinkside. CGA + RW for ADL t/f               Cognition Arousal/Alertness: Awake/alert Behavior During Therapy: WFL for tasks assessed/performed Overall Cognitive Status: Within Functional Limits for tasks assessed           Exercises Exercises: Other exercises;Total Joint Total Joint Exercises Hip ABduction/ADduction: AROM;Strengthening;Both;20 reps;Standing Long Arc Quad: AROM;Strengthening;Both;20 reps;Standing Marching in Standing: AROM;Strengthening;Both;20 reps;Standing Other Exercises Other Exercises: Pt educated re: OT role, DME recs, d/c recs, AE recs, falls prevention, HEP, home/routines modifications Other Exercises: LBD, hand washing, sit<>stand, sitting/standing balance/toelrance, ~20 ft in room mobility           Pertinent Vitals/ Pain       Pain Assessment: No/denies pain         Frequency  Min 2X/week        Progress Toward Goals  OT Goals(current goals can now be found in the care plan section)  Progress towards OT goals: Progressing toward goals  Acute Rehab OT Goals Patient Stated Goal: Get Stronger  and be able to walk again. OT Goal Formulation: With patient Time For Goal Achievement: 08/13/20 Potential to Achieve Goals: Good ADL Goals Pt Will Perform Grooming: standing;with supervision (c LRAD PRN) Pt Will Perform Lower Body Dressing: sit to/from stand;with min guard assist (c LRAD PRN) Pt Will Transfer to Toilet: with modified  independence;bedside commode;ambulating (c LRAD PRN)  Plan Frequency remains appropriate;Discharge plan needs to be updated       AM-PAC OT "6 Clicks" Daily Activity     Outcome Measure   Help from another person eating meals?: None Help from another person taking care of personal grooming?: A Little Help from another person toileting, which includes using toliet, bedpan, or urinal?: A Little Help from another person bathing (including washing, rinsing, drying)?: A Little Help from another person to put on and taking off regular upper body clothing?: None Help from another person to put on and taking off regular lower body clothing?: A Little 6 Click Score: 20    End of Session Equipment Utilized During Treatment: Rolling walker;Gait belt  OT Visit Diagnosis: Other abnormalities of gait and mobility (R26.89)   Activity Tolerance Patient tolerated treatment well   Patient Left with call bell/phone within reach;in chair   Nurse Communication          Time: 0962-8366 OT Time Calculation (min): 25 min  Charges: OT General Charges $OT Visit: 1 Visit OT Treatments $Self Care/Home Management : 8-22 mins $Therapeutic Exercise: 8-22 mins  Kathie Dike, M.S. OTR/L  07/31/20, 2:02 PM  ascom [phone]

## 2020-07-31 NOTE — Progress Notes (Signed)
PROGRESS NOTE    David Quinn  RWE:[id number] DOB: December 19, 1955 DOA: 07/28/2020 PCP: Marjie Skiff, NP   Assessment & Plan:   Active Problems:   Lumbar disc prolapse with compression radiculopathy   L3-L4 and L4-L5 disc protrusion: w/ associated left significant neuroforaminal narrowing and radiculopathy with subsequent left leg weakness and left lower back pain. S/p lumbar laminectomy/decompression microdiscectomy 2 levels on 07/29/20 as per neurosurg. Continue on IV decadron as per neuro surg. Improving slowly. Morphine, percocet prn for pain. PT/OT recs SNF but pt refuses and agrees to home health   Leukocytosis: likely reactive and trending down.   AKI: likely secondary to dehydration. Resolved.   HTN: will restart home dose of zestril. IV labetalol prn   Asthma: severity unknown. Continue on bronchodilators. Encourage incentive spirometry    DVT prophylaxis: SCDs Code Status: full  Family Communication: discussed pt's care w/ pt's family at bedside and answered their questions  Disposition Plan: PT recs SNF    Status is: Inpatient  Remains inpatient appropriate because:Unsafe d/c plan, IV treatments appropriate due to intensity of illness or inability to take PO and Inpatient level of care appropriate due to severity of illness   Dispo: The patient is from: Home              Anticipated d/c is to: SNF              Anticipated d/c date is: 2 days              Patient currently is not medically stable to d/c.        Consultants:   Neuro surg    Procedures:    Antimicrobials:    Subjective: Pt c/o generalized weakness   Objective: Vitals:   07/30/20 1202 07/30/20 1503 07/30/20 2038 07/30/20 2324  BP: 128/80 121/82 134/86 (!) 151/101  Pulse: 75 74 68 (!) 59  Resp: 16 16 17    Temp: 97.9 F (36.6 C) 98.1 F (36.7 C) 98.1 F (36.7 C) 97.9 F (36.6 C)  TempSrc: Oral Oral    SpO2: 100% 95% 96% 99%  Weight:      Height:         Intake/Output Summary (Last 24 hours) at 07/31/2020 0749 Last data filed at 07/31/2020 0448 Gross per 24 hour  Intake --  Output 2550 ml  Net -2550 ml   Filed Weights   07/28/20 1224  Weight: 74.8 kg    Examination:  General exam: Appears calm & comfortable  Respiratory system: clear breath sounds b/l. No wheezes, rales Cardiovascular system: S1/S2+. No clicks or rubs  Gastrointestinal system: Abd is soft, non-tender, non-distended, & hypoactive bowel sounds Central nervous system: Alert and oriented. Moved all 4 extremities  Psychiatry: Judgement and insight appear normal. Mood & affect appropriate.     Data Reviewed: I have personally reviewed following labs and imaging studies  CBC: Recent Labs  Lab 07/28/20 1931 07/29/20 0449 07/30/20 0609 07/31/20 0408  WBC 6.4 4.4 14.5* 12.2*  HGB 14.8 13.2 12.2* 12.5*  HCT 45.9 41.2 37.9* 37.5*  MCV 82.9 82.6 82.8 81.9  PLT 341 309 295 283   Basic Metabolic Panel: Recent Labs  Lab 07/28/20 1931 07/29/20 0449 07/30/20 0609 07/31/20 0408  NA 138 139 136 137  K 5.7* 4.3 4.8 4.7  CL 101 105 104 106  CO2 24 24 25 24   GLUCOSE 91 150* 122* 127*  BUN 42* 35* 31* 30*  CREATININE 1.42* 1.16 1.09 1.03  CALCIUM  9.3 9.1 8.5* 8.6*   GFR: Estimated Creatinine Clearance: 67.7 mL/min (by C-G formula based on SCr of 1.03 mg/dL). Liver Function Tests: Recent Labs  Lab 07/28/20 1931  AST 37  ALT 23  ALKPHOS 73  BILITOT 1.0  PROT 8.5*  ALBUMIN 3.9   No results for input(s): LIPASE, AMYLASE in the last 168 hours. No results for input(s): AMMONIA in the last 168 hours. Coagulation Profile: No results for input(s): INR, PROTIME in the last 168 hours. Cardiac Enzymes: No results for input(s): CKTOTAL, CKMB, CKMBINDEX, TROPONINI in the last 168 hours. BNP (last 3 results) No results for input(s): PROBNP in the last 8760 hours. HbA1C: No results for input(s): HGBA1C in the last 72 hours. CBG: No results for input(s):  GLUCAP in the last 168 hours. Lipid Profile: No results for input(s): CHOL, HDL, LDLCALC, TRIG, CHOLHDL, LDLDIRECT in the last 72 hours. Thyroid Function Tests: No results for input(s): TSH, T4TOTAL, FREET4, T3FREE, THYROIDAB in the last 72 hours. Anemia Panel: No results for input(s): VITAMINB12, FOLATE, FERRITIN, TIBC, IRON, RETICCTPCT in the last 72 hours. Sepsis Labs: No results for input(s): PROCALCITON, LATICACIDVEN in the last 168 hours.  Recent Results (from the past 240 hour(s))  Resp Panel by RT-PCR (Flu A&B, Covid) Nasopharyngeal Swab     Status: None   Collection Time: 07/28/20  7:31 PM   Specimen: Nasopharyngeal Swab; Nasopharyngeal(NP) swabs in vial transport medium  Result Value Ref Range Status   SARS Coronavirus 2 by RT PCR NEGATIVE NEGATIVE Final    Comment: (NOTE) SARS-CoV-2 target nucleic acids are NOT DETECTED.  The SARS-CoV-2 RNA is generally detectable in upper respiratory specimens during the acute phase of infection. The lowest concentration of SARS-CoV-2 viral copies this assay can detect is 138 copies/mL. A negative result does not preclude SARS-Cov-2 infection and should not be used as the sole basis for treatment or other patient management decisions. A negative result may occur with  improper specimen collection/handling, submission of specimen other than nasopharyngeal swab, presence of viral mutation(s) within the areas targeted by this assay, and inadequate number of viral copies(<138 copies/mL). A negative result must be combined with clinical observations, patient history, and epidemiological information. The expected result is Negative.  Fact Sheet for Patients:  BloggerCourse.com  Fact Sheet for Healthcare Providers:  SeriousBroker.it  This test is no t yet approved or cleared by the [place] FDA and  has been authorized for detection and/or diagnosis of SARS-CoV-2 by FDA under an  Emergency Use Authorization (EUA). This EUA will remain  in effect (meaning this test can be used) for the duration of the COVID-19 declaration under Section 564(b)(1) of the Act, 21 U.S.C.section 360bbb-3(b)(1), unless the authorization is terminated  or revoked sooner.       Influenza A by PCR NEGATIVE NEGATIVE Final   Influenza B by PCR NEGATIVE NEGATIVE Final    Comment: (NOTE) The Xpert Xpress SARS-CoV-2/FLU/RSV plus assay is intended as an aid in the diagnosis of influenza from Nasopharyngeal swab specimens and should not be used as a sole basis for treatment. Nasal washings and aspirates are unacceptable for Xpert Xpress SARS-CoV-2/FLU/RSV testing.  Fact Sheet for Patients: BloggerCourse.com  Fact Sheet for Healthcare Providers: SeriousBroker.it  This test is not yet approved or cleared by the [place] FDA and has been authorized for detection and/or diagnosis of SARS-CoV-2 by FDA under an Emergency Use Authorization (EUA). This EUA will remain in effect (meaning this test can be used) for the duration of the  COVID-19 declaration under Section 564(b)(1) of the Act, 21 U.S.C. section 360bbb-3(b)(1), unless the authorization is terminated or revoked.  Performed at [facility], [address], [place], [place] [address]          Radiology Studies: DG Lumbar Spine 2-3 Views  Result Date: 07/29/2020 CLINICAL DATA:  Lumbar laminectomy/decompression. EXAM: LUMBAR SPINE - 2-3 VIEW COMPARISON:  None. FINDINGS: Three fluoroscopic images were obtained. On the first fluoroscopic image, a surgical instrument is seen posteriorly at the L5 level. The second image is an AP image with a marker to the right of L4 and a surgical device over L3. On the final image, there is a the posterior surgical marker. The level cannot be identified with complete confidence but I believe identified level is likely L3. IMPRESSION:  Fluoroscopic images during surgery as above. Electronically Signed   By: Gerome Sam III M.D   On: 07/29/2020 16:05   DG C-Arm 1-60 Min-No Report  Result Date: 07/29/2020 Fluoroscopy was utilized by the requesting physician.  No radiographic interpretation.   DG C-Arm 1-60 Min-No Report  Result Date: 07/29/2020 Fluoroscopy was utilized by the requesting physician.  No radiographic interpretation.        Scheduled Meds: . cholecalciferol  1,000 Units Oral Daily  . dexamethasone (DECADRON) injection  10 mg Intravenous Q6H  . docusate sodium  200 mg Oral BID  . gabapentin  300 mg Oral TID  . influenza vac split quadrivalent PF  0.5 mL Intramuscular Tomorrow-1000  . mometasone-formoterol  2 puff Inhalation BID  . multivitamin with minerals  1 tablet Oral Daily   Continuous Infusions: . sodium chloride 75 mL/hr at 07/31/20 0517     LOS: 3 days    Time spent: 35 mins    Charise Killian, MD [facility] Pager [phone]  If 7PM-7AM, please contact night-coverage 07/31/2020, 7:49 AM

## 2020-07-31 NOTE — TOC Initial Note (Signed)
Transition of Care [facility]) - Initial/Assessment Note    Patient Details  Name: David Quinn MRN: [id number] Date of Birth: 1955/11/13  Transition of Care [facility]) CM/SW Contact:    Trenton Founds, RN Phone Number: 07/31/2020, 11:10 AM  Clinical Narrative:      RNCM phoned into room to speak with patient but did not get answer. RNCM reached out to patient's wife who was in room by telephone for assessment. Patient was getting ready to get up for bath. RNCM discussed discharge planning and current recommendation for SNF placement. Wife verbalizes that patient will not go to SNF and it will be their plan for him to return home and they would to have PT come to home when he does. Wife also verbalizes that they are agreeable to him having a 3N1 as he does not currently have one of those.  RNCM reached out to [place] with [facility] and she is able to accept referral for home health.  RNCM reached out to [facility] with [employer] for 3N1.               Expected Discharge Plan: Home w Home Health Services Barriers to Discharge: No Barriers Identified   Patient Goals and CMS Choice        Expected Discharge Plan and Services Expected Discharge Plan: Home w Home Health Services       Living arrangements for the past 2 months: Single Family Home                 DME Arranged: 3-N-1 DME Agency: [employer] Date DME Agency Contacted: 07/31/20 Time DME Agency Contacted: 1005 Representative spoke with at DME Agency: Elease Hashimoto HH Arranged: PT HH Agency: [facility] Date [facility] Agency Contacted: 07/31/20 Time HH Agency Contacted: 1055 Representative spoke with at [facility] Agency: [place]  Prior Living Arrangements/Services Living arrangements for the past 2 months: Single Family Home Lives with:: Spouse Patient language and need for interpreter reviewed:: Yes Do you feel safe going back to the place where you live?: Yes      Need for Family Participation in Patient Care: Yes (Comment) Care giver  support system in place?: Yes (comment)   Criminal Activity/Legal Involvement Pertinent to Current Situation/Hospitalization: No - Comment as needed  Activities of Daily Living Home Assistive Devices/Equipment: None ADL Screening (condition at time of admission) Patient's cognitive ability adequate to safely complete daily activities?: Yes Is the patient deaf or have difficulty hearing?: No Does the patient have difficulty seeing, even when wearing glasses/contacts?: No Does the patient have difficulty concentrating, remembering, or making decisions?: No Patient able to express need for assistance with ADLs?: Yes Does the patient have difficulty dressing or bathing?: No Independently performs ADLs?: Yes (appropriate for developmental age) Does the patient have difficulty walking or climbing stairs?: No Weakness of Legs: Both Weakness of Arms/Hands: None  Permission Sought/Granted                  Emotional Assessment       Orientation: : Oriented to Self, Oriented to Place, Oriented to  Time, Oriented to Situation Alcohol / Substance Use: Not Applicable Psych Involvement: No (comment)  Admission diagnosis:  Lumbar radiculopathy [M54.16] Left leg numbness [R20.0] Lumbar disc prolapse with compression radiculopathy [M51.16] Weakness of left lower extremity [R29.898] Acute left-sided low back pain without sciatica [M54.50] Patient Active Problem List   Diagnosis Date Noted  . Lumbar disc prolapse with compression radiculopathy 07/28/2020  . Neutropenia (HCC) 04/13/2019  . Cervical  pain (neck) 10/30/2017  . Hyperlipidemia 04/23/2016  . Hypertension 04/04/2015  . Asthma, moderate persistent 04/04/2015   PCP:  Marjie Skiff, NP Pharmacy:   [facility] [phone] [address], [place] - [address] AT [facility] OF [address] [place] [address] Phone: [phone] Fax: [phone]  [id number] - [place], [place] - [address] [place] [address] Phone: [phone] Fax: [phone]     Social Determinants of Health (SDOH) Interventions    Readmission Risk Interventions No flowsheet data found.

## 2020-07-31 NOTE — Progress Notes (Signed)
  Progress Note:   History: David Quinn is here for acute L L4 radiculopathy due to acute disc herniation. POD1: Nestor has no pain. He remains with some left sided quadricep/knee extension weakness.  POD1: Mr. Thomann has little pain.  His sensation is similar.  No new issues.  Physical Exam: Vitals:   07/31/20 0824 07/31/20 1204  BP: (!) 141/89 (!) 149/78  Pulse: (!) 59 79  Resp: 17 16  Temp: 97.9 F (36.6 C) 98.3 F (36.8 C)  SpO2: 97% 99%    AA Ox3 CNI  Strength:5/5 throughout BLE except L knee extension, 3/5.  Incision c/d/i  Data:  Recent Labs  Lab 07/29/20 0449 07/29/20 0449 07/30/20 0609 07/30/20 0609 07/31/20 0408  NA 139   < > 136   < > 137  K 4.3   < > 4.8   < > 4.7  CL 105   < > 104   < > 106  CO2 24   < > 25   < > 24  BUN 35*   < > 31*   < > 30*  CREATININE 1.16   < > 1.09   < > 1.03  GLUCOSE 150*   < > 122*   < > 127*  CALCIUM 9.1  --  8.5*  --  8.6*   < > = values in this interval not displayed.   Recent Labs  Lab 07/28/20 1931  AST 37  ALT 23  ALKPHOS 73     Recent Labs  Lab 07/29/20 0449 07/29/20 0449 07/30/20 0609 07/30/20 0609 07/31/20 0408  WBC 4.4   < > 14.5*   < > 12.2*  HGB 13.2   < > 12.2*   < > 12.5*  HCT 41.2   < > 37.9*   < > 37.5*  PLT 309  --  295  --  283   < > = values in this interval not displayed.   No results for input(s): APTT, INR in the last 168 hours.       Other tests/results: none  Assessment/Plan:  Ivor Messier is doing well postop L3-4 microdiscectomy, L4-5 far lateral discectomy  - mobilize - pain control - DVT prophylaxis - PTOT   Patsey Berthold, NP Department of Neurosurgery

## 2020-07-31 NOTE — Progress Notes (Signed)
Physical Therapy Treatment Patient Details Name: David Quinn MRN: [id number] DOB: 07-09-1956 Today's Date: 07/31/2020    History of Present Illness David Quinn is a 64 y.o. male who presents with the chief complaint of lower back pain, left leg pain, and left leg weakness.  He is s/p lumbar laminectomy on 07/29/20.    PT Comments    Continues with very limited active isolated strength of L quads in both open- and closed-chain positions.  Good awareness of deficit; good use of compensatory strategies with mobility tasks.  Able to progress gait to 200' distance with use of RW; single significant episode of L LE buckling, mod assist from therapist to recover/prevent fall. Did initiate stair training, performing backwards with RW; min assist +2 for safety.  Good demonstration of technique; very hesitant for L LE loading in modified SLS (due to known risk of buckling).  Recommend continued +2 and repeat efforts at stair negotiation as able.    Follow Up Recommendations  Home health PT     Equipment Recommendations  Rolling walker with 5" wheels;3in1 (PT)    Recommendations for Other Services       Precautions / Restrictions Precautions Precautions: Fall;Back Restrictions Weight Bearing Restrictions: No    Mobility  Bed Mobility               General bed mobility comments: seated in recliner beginning/end of treatment session  Transfers Overall transfer level: Needs assistance Equipment used: Rolling walker (2 wheeled) Transfers: Sit to/from Stand Sit to Stand: Min assist         General transfer comment: good hand placement; heavy use of UE support for lift off and stabilization; min cuing to maintain L foot contact with floor and actively utilize as able  Ambulation/Gait Ambulation/Gait assistance: Min assist Gait Distance (Feet): 200 Feet Assistive device: Rolling walker (2 wheeled)       General Gait Details: 3-point, step to gait pattern; slow  and cautious, heavy support bilat UEs.  Mild buckling L LE, able to self-correct 90% time; single episode of significant buckling, mod assist to recover   Stairs Stairs: Yes Stairs assistance: Min assist;Mod assist;+2 safety/equipment Stair Management: Backwards;With walker Number of Stairs: 3 General stair comments: step to gait pattern; step by step cuing for technique.   Patient very hesitant/fearful of L LE WBing   Wheelchair Mobility    Modified Rankin (Stroke Patients Only)       Balance Overall balance assessment: Needs assistance Sitting-balance support: No upper extremity supported;Feet supported Sitting balance-Leahy Scale: Good     Standing balance support: Bilateral upper extremity supported Standing balance-Leahy Scale: Fair                              Cognition Arousal/Alertness: Awake/alert Behavior During Therapy: WFL for tasks assessed/performed Overall Cognitive Status: Within Functional Limits for tasks assessed                                        Exercises      General Comments        Pertinent Vitals/Pain Pain Assessment: No/denies pain    Home Living                      Prior Function            PT  Goals (current goals can now be found in the care plan section) Acute Rehab PT Goals Patient Stated Goal: Get Stronger and be able to walk again. PT Goal Formulation: With patient Time For Goal Achievement: 08/13/20 Potential to Achieve Goals: Good Progress towards PT goals: Progressing toward goals    Frequency    BID      PT Plan Current plan remains appropriate    Co-evaluation              AM-PAC PT "6 Clicks" Mobility   Outcome Measure  Help needed turning from your back to your side while in a flat bed without using bedrails?: None Help needed moving from lying on your back to sitting on the side of a flat bed without using bedrails?: None Help needed moving to and from a  bed to a chair (including a wheelchair)?: A Little Help needed standing up from a chair using your arms (e.g., wheelchair or bedside chair)?: A Little Help needed to walk in hospital room?: A Little Help needed climbing 3-5 steps with a railing? : A Lot 6 Click Score: 19    End of Session Equipment Utilized During Treatment: Gait belt Activity Tolerance: Patient tolerated treatment well Patient left: in chair;with call bell/phone within reach;with chair alarm set Nurse Communication: Mobility status PT Visit Diagnosis: Unsteadiness on feet (R26.81);Other abnormalities of gait and mobility (R26.89);Muscle weakness (generalized) (M62.81);Difficulty in walking, not elsewhere classified (R26.2)     Time: 3382-5053 PT Time Calculation (min) (ACUTE ONLY): 24 min  Charges:  $Gait Training: 8-22 mins $Therapeutic Activity: 8-22 mins                     Jaislyn Blinn H. Manson Passey, PT, DPT, NCS 07/31/20, 11:21 AM [phone]

## 2020-07-31 NOTE — Progress Notes (Signed)
Physical Therapy Treatment Patient Details Name: David Quinn MRN: [id number] DOB: 02/22/1956 Today's Date: 07/31/2020    History of Present Illness David Quinn is a 64 y.o. male who presents with the chief complaint of lower back pain, left leg pain, and left leg weakness.  He is s/p lumbar laminectomy on 07/29/20.    PT Comments    Patient remains slow and guarded in performance, but good recall of technique, safety needs and compensatory strategies.  Noted improvement in comfort/confidence in movement; progressing well towards goals and upcoming discharge.    Follow Up Recommendations  Home health PT     Equipment Recommendations  Rolling walker with 5" wheels;3in1 (PT)    Recommendations for Other Services       Precautions / Restrictions Precautions Precautions: Fall;Back Restrictions Weight Bearing Restrictions: No    Mobility  Bed Mobility Overal bed mobility: Needs Assistance Bed Mobility: Sit to Supine       Sit to supine: Min guard;Min assist   General bed mobility comments: good carry-over of log-rolling technique; does require use of LE 'hook technique' to elevate L LE  Transfers Overall transfer level: Needs assistance Equipment used: Rolling walker (2 wheeled) Transfers: Sit to/from Stand Sit to Stand: Min guard         General transfer comment: improving hand/foot placement  Ambulation/Gait Ambulation/Gait assistance: Min guard Gait Distance (Feet): 200 Feet Assistive device: Rolling walker (2 wheeled)   Gait velocity: 10' walk time, 15 seconds   General Gait Details: mild buckling with patient-led attempts to progress to step through gait pattern; min cuing for return to step to for optimal control   Stairs Stairs: Yes Stairs assistance: Min guard;Min assist;+2 safety/equipment Stair Management: Backwards;With walker Number of Stairs: 3 General stair comments: fair/good carry-over of technique; min cuing for walker  position   Wheelchair Mobility    Modified Rankin (Stroke Patients Only)       Balance Overall balance assessment: Needs assistance Sitting-balance support: No upper extremity supported;Feet supported Sitting balance-Leahy Scale: Good     Standing balance support: Bilateral upper extremity supported Standing balance-Leahy Scale: Fair                              Cognition Arousal/Alertness: Awake/alert Behavior During Therapy: WFL for tasks assessed/performed Overall Cognitive Status: Within Functional Limits for tasks assessed                                        Exercises Total Joint Exercises Hip ABduction/ADduction: AROM;Strengthening;Both;20 reps;Standing Long Arc Quad: AROM;Strengthening;Both;20 reps;Standing Marching in Standing: AROM;Strengthening;Both;20 reps;Standing Other Exercises Other Exercises: Verbally reviewed technique for car transfer training; patient voiced understanding of technique. Other Exercises: LBD, hand washing, sit<>stand, sitting/standing balance/toelrance, ~20 ft in room mobility    General Comments        Pertinent Vitals/Pain Pain Assessment: No/denies pain    Home Living                      Prior Function            PT Goals (current goals can now be found in the care plan section) Acute Rehab PT Goals Patient Stated Goal: Get Stronger and be able to walk again. PT Goal Formulation: With patient Time For Goal Achievement: 08/13/20 Potential to Achieve Goals: Good Progress towards  PT goals: Progressing toward goals    Frequency    BID      PT Plan Current plan remains appropriate    Co-evaluation              AM-PAC PT "6 Clicks" Mobility   Outcome Measure  Help needed turning from your back to your side while in a flat bed without using bedrails?: None Help needed moving from lying on your back to sitting on the side of a flat bed without using bedrails?:  None Help needed moving to and from a bed to a chair (including a wheelchair)?: A Little Help needed standing up from a chair using your arms (e.g., wheelchair or bedside chair)?: A Little Help needed to walk in hospital room?: A Little Help needed climbing 3-5 steps with a railing? : A Little 6 Click Score: 20    End of Session Equipment Utilized During Treatment: Gait belt Activity Tolerance: Patient tolerated treatment well Patient left: with call bell/phone within reach;in bed;with bed alarm set Nurse Communication: Mobility status PT Visit Diagnosis: Unsteadiness on feet (R26.81);Other abnormalities of gait and mobility (R26.89);Muscle weakness (generalized) (M62.81);Difficulty in walking, not elsewhere classified (R26.2)     Time: 2992-4268 PT Time Calculation (min) (ACUTE ONLY): 20 min  Charges:  $Gait Training: 8-22 mins                     [name] H. Manson Passey, PT, DPT, NCS 07/31/20, 4:26 PM [phone]

## 2020-08-01 LAB — BASIC METABOLIC PANEL
Anion gap: 8 (ref 5–15)
BUN: 28 mg/dL — ABNORMAL HIGH (ref 8–23)
CO2: 25 mmol/L (ref 22–32)
Calcium: 8.5 mg/dL — ABNORMAL LOW (ref 8.9–10.3)
Chloride: 103 mmol/L (ref 98–111)
Creatinine, Ser: 0.89 mg/dL (ref 0.61–1.24)
GFR, Estimated: >60 mL/min (ref >60)
Glucose, Bld: 88 mg/dL (ref 70–99)
Potassium: 4.3 mmol/L (ref 3.5–5.1)
Sodium: 136 mmol/L (ref 135–145)

## 2020-08-01 LAB — CBC
HCT: 35.5 % — ABNORMAL LOW (ref 39.0–52.0)
Hemoglobin: 11.4 g/dL — ABNORMAL LOW (ref 13.0–17.0)
MCH: 26.6 pg (ref 26.0–34.0)
MCHC: 32.1 g/dL (ref 30.0–36.0)
MCV: 82.8 fL (ref 80.0–100.0)
Platelets: 271 10*3/uL (ref 150–400)
RBC: 4.29 MIL/uL (ref 4.22–5.81)
RDW: 13.2 % (ref 11.5–15.5)
WBC: 11.2 10*3/uL — ABNORMAL HIGH (ref 4.0–10.5)
nRBC: 0 % (ref 0.0–0.2)

## 2020-08-01 NOTE — Progress Notes (Signed)
PROGRESS NOTE    David Quinn  CLE:[id number] DOB: Mar 30, 1956 DOA: 07/28/2020 PCP: Marjie Skiff, NP   Assessment & Plan:   Active Problems:   Lumbar disc prolapse with compression radiculopathy   L3-L4 and L4-L5 disc protrusion: w/ associated left significant neuroforaminal narrowing and radiculopathy with subsequent left leg weakness and left lower back pain. S/p lumbar laminectomy/decompression microdiscectomy 2 levels on 07/29/20 as per neurosurg. Continue on IV decadron as per neuro surg. Morphine, percocet prn for pain. PT/OT recs SNF initially but now recs home health . Improving slowly   Leukocytosis: likely reactive. Will continue to monitor    AKI: resolved   HTN: will continue on lisinopril. IV labetalol prn   Asthma: severity unknown. Encourage incentive spirometry and continue w/ bronchodilators    DVT prophylaxis: SCDs Code Status: full  Family Communication: discussed pt's care w/ pt's family at bedside and answered their questions  Disposition Plan: PT recs SNF    Status is: Inpatient  Remains inpatient appropriate because:Unsafe d/c plan, IV treatments appropriate due to intensity of illness or inability to take PO and Inpatient level of care appropriate due to severity of illness   Dispo: The patient is from: Home              Anticipated d/c is to: SNF              Anticipated d/c date is: 2 days              Patient currently is not medically stable to d/c.        Consultants:   Neuro surg    Procedures:    Antimicrobials:    Subjective: Pt c/o fatigue  Objective: Vitals:   07/31/20 2014 08/01/20 0017 08/01/20 0416 08/01/20 0740  BP: 107/76 133/88 123/81 (!) 137/96  Pulse: 85 60 (!) 56 (!) 52  Resp: 17 16  16   Temp: 98.1 F (36.7 C) 97.8 F (36.6 C) 98.5 F (36.9 C) 97.9 F (36.6 C)  TempSrc:   Oral Oral  SpO2: 98% 97% 95% 100%  Weight:      Height:        Intake/Output Summary (Last 24 hours) at 08/01/2020  0749 Last data filed at 08/01/2020 0014 Gross per 24 hour  Intake --  Output 1100 ml  Net -1100 ml   Filed Weights   07/28/20 1224  Weight: 74.8 kg    Examination:  General exam: Appears calm & comfortable  Respiratory system: Clear breath sounds b/l  Cardiovascular system: S1 & S2+. No clicks or rubs  Gastrointestinal system: Abd is soft, non-tender, non-distended, & hypoactive bowel sounds Central nervous system: Alert and oriented. Moves all 4 extremities  Psychiatry: Judgement and insight appear normal. Mood & affect appropriate.     Data Reviewed: I have personally reviewed following labs and imaging studies  CBC: Recent Labs  Lab 07/28/20 1931 07/29/20 0449 07/30/20 0609 07/31/20 0408 08/01/20 0442  WBC 6.4 4.4 14.5* 12.2* 11.2*  HGB 14.8 13.2 12.2* 12.5* 11.4*  HCT 45.9 41.2 37.9* 37.5* 35.5*  MCV 82.9 82.6 82.8 81.9 82.8  PLT 341 309 295 283 271   Basic Metabolic Panel: Recent Labs  Lab 07/28/20 1931 07/29/20 0449 07/30/20 0609 07/31/20 0408 08/01/20 0442  NA 138 139 136 137 136  K 5.7* 4.3 4.8 4.7 4.3  CL 101 105 104 106 103  CO2 24 24 25 24 25   GLUCOSE 91 150* 122* 127* 88  BUN 42* 35* 31*  30* 28*  CREATININE 1.42* 1.16 1.09 1.03 0.89  CALCIUM 9.3 9.1 8.5* 8.6* 8.5*   GFR: Estimated Creatinine Clearance: 78.4 mL/min (by C-G formula based on SCr of 0.89 mg/dL). Liver Function Tests: Recent Labs  Lab 07/28/20 1931  AST 37  ALT 23  ALKPHOS 73  BILITOT 1.0  PROT 8.5*  ALBUMIN 3.9   No results for input(s): LIPASE, AMYLASE in the last 168 hours. No results for input(s): AMMONIA in the last 168 hours. Coagulation Profile: No results for input(s): INR, PROTIME in the last 168 hours. Cardiac Enzymes: No results for input(s): CKTOTAL, CKMB, CKMBINDEX, TROPONINI in the last 168 hours. BNP (last 3 results) No results for input(s): PROBNP in the last 8760 hours. HbA1C: No results for input(s): HGBA1C in the last 72 hours. CBG: No results  for input(s): GLUCAP in the last 168 hours. Lipid Profile: No results for input(s): CHOL, HDL, LDLCALC, TRIG, CHOLHDL, LDLDIRECT in the last 72 hours. Thyroid Function Tests: No results for input(s): TSH, T4TOTAL, FREET4, T3FREE, THYROIDAB in the last 72 hours. Anemia Panel: No results for input(s): VITAMINB12, FOLATE, FERRITIN, TIBC, IRON, RETICCTPCT in the last 72 hours. Sepsis Labs: No results for input(s): PROCALCITON, LATICACIDVEN in the last 168 hours.  Recent Results (from the past 240 hour(s))  Resp Panel by RT-PCR (Flu A&B, Covid) Nasopharyngeal Swab     Status: None   Collection Time: 07/28/20  7:31 PM   Specimen: Nasopharyngeal Swab; Nasopharyngeal(NP) swabs in vial transport medium  Result Value Ref Range Status   SARS Coronavirus 2 by RT PCR NEGATIVE NEGATIVE Final    Comment: (NOTE) SARS-CoV-2 target nucleic acids are NOT DETECTED.  The SARS-CoV-2 RNA is generally detectable in upper respiratory specimens during the acute phase of infection. The lowest concentration of SARS-CoV-2 viral copies this assay can detect is 138 copies/mL. A negative result does not preclude SARS-Cov-2 infection and should not be used as the sole basis for treatment or other patient management decisions. A negative result may occur with  improper specimen collection/handling, submission of specimen other than nasopharyngeal swab, presence of viral mutation(s) within the areas targeted by this assay, and inadequate number of viral copies(<138 copies/mL). A negative result must be combined with clinical observations, patient history, and epidemiological information. The expected result is Negative.  Fact Sheet for Patients:  BloggerCourse.com  Fact Sheet for Healthcare Providers:  SeriousBroker.it  This test is no t yet approved or cleared by the [place] FDA and  has been authorized for detection and/or diagnosis of SARS-CoV-2 by FDA  under an Emergency Use Authorization (EUA). This EUA will remain  in effect (meaning this test can be used) for the duration of the COVID-19 declaration under Section 564(b)(1) of the Act, 21 U.S.C.section 360bbb-3(b)(1), unless the authorization is terminated  or revoked sooner.       Influenza A by PCR NEGATIVE NEGATIVE Final   Influenza B by PCR NEGATIVE NEGATIVE Final    Comment: (NOTE) The Xpert Xpress SARS-CoV-2/FLU/RSV plus assay is intended as an aid in the diagnosis of influenza from Nasopharyngeal swab specimens and should not be used as a sole basis for treatment. Nasal washings and aspirates are unacceptable for Xpert Xpress SARS-CoV-2/FLU/RSV testing.  Fact Sheet for Patients: BloggerCourse.com  Fact Sheet for Healthcare Providers: SeriousBroker.it  This test is not yet approved or cleared by the [place] FDA and has been authorized for detection and/or diagnosis of SARS-CoV-2 by FDA under an Emergency Use Authorization (EUA). This EUA will remain in  effect (meaning this test can be used) for the duration of the COVID-19 declaration under Section 564(b)(1) of the Act, 21 U.S.C. section 360bbb-3(b)(1), unless the authorization is terminated or revoked.  Performed at [facility], [address], [place], [place] [address]          Radiology Studies: No results found.      Scheduled Meds: . cholecalciferol  1,000 Units Oral Daily  . dexamethasone (DECADRON) injection  10 mg Intravenous Q6H  . docusate sodium  200 mg Oral BID  . gabapentin  300 mg Oral TID  . influenza vac split quadrivalent PF  0.5 mL Intramuscular Tomorrow-1000  . lisinopril  10 mg Oral Daily  . mometasone-formoterol  2 puff Inhalation BID  . multivitamin with minerals  1 tablet Oral Daily   Continuous Infusions:    LOS: 4 days    Time spent: 30 mins    Charise Killian, MD [facility] Pager  [phone]  If 7PM-7AM, please contact night-coverage 08/01/2020, 7:49 AM

## 2020-08-01 NOTE — Progress Notes (Signed)
Physical Therapy Treatment Patient Details Name: David Quinn MRN: [id number] DOB: 01-31-1956 Today's Date: 08/01/2020    History of Present Illness David Quinn is a 64 y.o. male who presents with the chief complaint of lower back pain, left leg pain, and left leg weakness.  He is s/p lumbar laminectomy on 07/29/20.    PT Comments    Pt was long sitting in bed upon arriving. He is eager for OOB activity and cooperative and pleasant. Easily able to exit L side of bed, stand to RW, and ambulate without LOB. Performed ascending/descending  4 stairs backwards with RW + step to pattern. Overall pt demonstrates safe abilities. Recommend DC to home with [facility] services to follow. Pt states he has used advanced home care in the past and is willing to use again at DC. Cleared from PT standpoint for safe DC home with family support.    Follow Up Recommendations  Home health PT     Equipment Recommendations  Rolling walker with 5" wheels;3in1 (PT)    Recommendations for Other Services       Precautions / Restrictions Precautions Precautions: Fall;Back Restrictions Weight Bearing Restrictions: No    Mobility  Bed Mobility Overal bed mobility: Needs Assistance Bed Mobility: Supine to Sit;Rolling;Sidelying to Sit Rolling: Supervision Sidelying to sit: Supervision Supine to sit: Min assist Sit to supine: Min assist   General bed mobility comments: Pt was able to exit L sid eof bed via log roll technique. vcs only for sequencing improvements. no PHysical assistance to exit bed. Did require min assist to progress BLes into bed  Transfers Overall transfer level: Needs assistance Equipment used: Rolling walker (2 wheeled) Transfers: Sit to/from Stand Sit to Stand: Supervision         General transfer comment: Pt demonstrated safe ability to STS form EOB  Ambulation/Gait Ambulation/Gait assistance: Supervision Gait Distance (Feet): 200 Feet Assistive device: Rolling walker  (2 wheeled) Gait Pattern/deviations: Step-through pattern Gait velocity: pt ambulated 200 ft with RW without difficulty   General Gait Details: vcs for posture correction only   Stairs Stairs: Yes Stairs assistance: Supervision Stair Management: Backwards;With walker Number of Stairs: 4 General stair comments: Pt demonstrated safe ability to ascend/descend 4 stair with use of walker only      Balance Overall balance assessment: Needs assistance Sitting-balance support: No upper extremity supported;Feet supported Sitting balance-Leahy Scale: Good     Standing balance support: Bilateral upper extremity supported Standing balance-Leahy Scale: Good Standing balance comment: no LOB or unsteadiness       Cognition Arousal/Alertness: Awake/alert Behavior During Therapy: WFL for tasks assessed/performed Overall Cognitive Status: Within Functional Limits for tasks assessed      General Comments: Pt is A and O x 4. Very pleasant and agreeable to session             Pertinent Vitals/Pain Pain Assessment: No/denies pain           PT Goals (current goals can now be found in the care plan section) Acute Rehab PT Goals Patient Stated Goal: Get Stronger and go home Progress towards PT goals: Progressing toward goals    Frequency    BID      PT Plan Current plan remains appropriate       AM-PAC PT "6 Clicks" Mobility   Outcome Measure  Help needed turning from your back to your side while in a flat bed without using bedrails?: None Help needed moving from lying on your back to sitting on  the side of a flat bed without using bedrails?: None Help needed moving to and from a bed to a chair (including a wheelchair)?: A Little Help needed standing up from a chair using your arms (e.g., wheelchair or bedside chair)?: A Little Help needed to walk in hospital room?: A Little Help needed climbing 3-5 steps with a railing? : A Little 6 Click Score: 20    End of Session  Equipment Utilized During Treatment: Gait belt Activity Tolerance: Patient tolerated treatment well Patient left: with call bell/phone within reach;in bed;with bed alarm set Nurse Communication: Mobility status PT Visit Diagnosis: Unsteadiness on feet (R26.81);Other abnormalities of gait and mobility (R26.89);Muscle weakness (generalized) (M62.81);Difficulty in walking, not elsewhere classified (R26.2)     Time: 1340-1401 PT Time Calculation (min) (ACUTE ONLY): 21 min  Charges:  $Gait Training: 8-22 mins                     Jetta Lout PTA 08/01/20, 2:21 PM

## 2020-08-01 NOTE — Progress Notes (Signed)
Physical Therapy Treatment Patient Details Name: David Quinn MRN: [id number] DOB: 04/19/1956 Today's Date: 08/01/2020    History of Present Illness David Quinn is a 64 y.o. male who presents with the chief complaint of lower back pain, left leg pain, and left leg weakness.  He is s/p lumbar laminectomy on 07/29/20.    PT Comments     Pt was supine in bed upon arriving. He agrees to session and is cooperative and motivated throughout. Was able to ambulate short distance with +1 UE HHA for support.  He did have two occasions of L knee buckling. No knee buckling with use of RW. After ambulation, session focused on bed mobility. By the end of session he was able to progress BLEs back into bed without physical assistance. Pt is extremely motivated and progressing well. Recommend DC home with HHPT to follow.    Follow Up Recommendations  Home health PT     Equipment Recommendations  Rolling walker with 5" wheels;3in1 (PT)       Precautions / Restrictions Precautions Precautions: Fall;Back Restrictions Weight Bearing Restrictions: No    Mobility  Bed Mobility Overal bed mobility: Needs Assistance Bed Mobility: Supine to Sit;Sit to Supine Rolling: Supervision Sidelying to sit: Supervision Supine to sit: Supervision Sit to supine: Supervision   General bed mobility comments: session focused on progressing getting back into bed after OOB activity. he was able to progress back into bed without physical assistance. did require alot of increased time and vcs for technique   Transfers Overall transfer level: Needs assistance Equipment used: Rolling walker (2 wheeled) Transfers: Sit to/from Stand Sit to Stand: Supervision         General transfer comment: Pt was able to safely STS from EOB to RW.   Ambulation/Gait Ambulation/Gait assistance: Supervision Gait Distance (Feet): 200 Feet Assistive device: Rolling walker (2 wheeled) Gait Pattern/deviations: Step-through  pattern Gait velocity: decreased without use of RW   General Gait Details: Pt was able to ambulate 200 ft with RW and ~ 25 ft with +1 HHA only. does have L knee buckling without use of RW.    Stairs Stairs: Yes Stairs assistance: Supervision Stair Management: Backwards;With walker Number of Stairs: 4 General stair comments: Pt demonstrated safe ability to ascend/descend 4 stair with use of walker only     Balance Overall balance assessment: Needs assistance Sitting-balance support: No upper extremity supported;Feet supported Sitting balance-Leahy Scale: Good     Standing balance support: Bilateral upper extremity supported Standing balance-Leahy Scale: Good Standing balance comment: no LOB or unsteadiness       Cognition Arousal/Alertness: Awake/alert Behavior During Therapy: WFL for tasks assessed/performed Overall Cognitive Status: Within Functional Limits for tasks assessed    General Comments: Pt is A and O x 4. Very pleasant and agreeable to session       Pertinent Vitals/Pain Pain Assessment: No/denies pain           PT Goals (current goals can now be found in the care plan section) Acute Rehab PT Goals Patient Stated Goal: to get back to work Progress towards PT goals: Progressing toward goals    Frequency    BID      PT Plan Current plan remains appropriate       AM-PAC PT "6 Clicks" Mobility   Outcome Measure  Help needed turning from your back to your side while in a flat bed without using bedrails?: None Help needed moving from lying on your back to sitting on  the side of a flat bed without using bedrails?: None Help needed moving to and from a bed to a chair (including a wheelchair)?: A Little Help needed standing up from a chair using your arms (e.g., wheelchair or bedside chair)?: A Little Help needed to walk in hospital room?: A Little Help needed climbing 3-5 steps with a railing? : A Little 6 Click Score: 20    End of Session  Equipment Utilized During Treatment: Gait belt Activity Tolerance: Patient tolerated treatment well Patient left: with call bell/phone within reach;in bed;with bed alarm set Nurse Communication: Mobility status PT Visit Diagnosis: Unsteadiness on feet (R26.81);Other abnormalities of gait and mobility (R26.89);Muscle weakness (generalized) (M62.81);Difficulty in walking, not elsewhere classified (R26.2)     Time: 1540-1601 PT Time Calculation (min) (ACUTE ONLY): 21 min  Charges:  $Gait Training: 8-22 mins                     Jetta Lout PTA 08/01/20, 4:21 PM

## 2020-08-02 LAB — CBC
HCT: 39 % (ref 39.0–52.0)
Hemoglobin: 12.6 g/dL — ABNORMAL LOW (ref 13.0–17.0)
MCH: 26.6 pg (ref 26.0–34.0)
MCHC: 32.3 g/dL (ref 30.0–36.0)
MCV: 82.5 fL (ref 80.0–100.0)
Platelets: 302 10*3/uL (ref 150–400)
RBC: 4.73 MIL/uL (ref 4.22–5.81)
RDW: 13.1 % (ref 11.5–15.5)
WBC: 12.4 10*3/uL — ABNORMAL HIGH (ref 4.0–10.5)
nRBC: 0 % (ref 0.0–0.2)

## 2020-08-02 LAB — BASIC METABOLIC PANEL
Anion gap: 8 (ref 5–15)
BUN: 25 mg/dL — ABNORMAL HIGH (ref 8–23)
CO2: 28 mmol/L (ref 22–32)
Calcium: 9 mg/dL (ref 8.9–10.3)
Chloride: 100 mmol/L (ref 98–111)
Creatinine, Ser: 0.9 mg/dL (ref 0.61–1.24)
GFR, Estimated: >60 mL/min (ref >60)
Glucose, Bld: 127 mg/dL — ABNORMAL HIGH (ref 70–99)
Potassium: 4.7 mmol/L (ref 3.5–5.1)
Sodium: 136 mmol/L (ref 135–145)

## 2020-08-02 MED ORDER — GABAPENTIN 300 MG PO CAPS
300.0000 mg | ORAL_CAPSULE | Freq: Three times a day (TID) | ORAL | 0 refills | Status: DC
Start: 2020-08-02 — End: 2020-12-29

## 2020-08-02 NOTE — Discharge Summary (Signed)
Physician Discharge Summary  David Quinn NGE:[id number] DOB: 03/02/1956 DOA: 07/28/2020  PCP: Marjie Skiffannady, Jolene T, NP  Admit date: 07/28/2020 Discharge date: 08/02/2020  Discharge disposition: Home with home health care    Recommendations for Outpatient Follow-Up:   Follow up with Dr. Myer HaffYarbrough, neurosurgeon, in 1 to 2 weeks   Discharge Diagnosis:   Active Problems:   Lumbar disc prolapse with compression radiculopathy    Discharge Condition: Stable.  Diet recommendation:  Diet Order            Diet - low sodium heart healthy           Diet regular Room service appropriate? Yes; Fluid consistency: Thin  Diet effective now                   Code Status: Full Code     Hospital Course:   David Quinn is a 64 year old man with history of asthma, dyslipidemia, hypertension, who presented to the hospital because of worsening left lower back pain yesterday with left leg pain, weakness and numbness.  He was found to have L3-L4 and L4-L5 disc protrusion with anterior left significant neuroforaminal narrowing and radiculopathy.  He also had AKI complicated by hyperkalemia.  He was treated with analgesics and IV fluids.  Hyperkalemia was corrected and AKI resolved with fluids.  He was seen in consultation by the neurosurgeon, Dr. Marcell BarlowYarborough.  He underwent L3-4 microdiscectomy, L4-5 lateral discectomy on 07/29/2020.  He was evaluated by physical and occupational therapist who recommended further home health therapy.  His condition has improved and he is deemed stable for discharge to home today.  Patient is okay for for discharge from Dr. Osborne OmanYarborough's standpoint (case discussed with him via secure chart).   Medical Consultants:    Neurosurgeon   Discharge Exam:    Vitals:   08/01/20 2354 08/02/20 0424 08/02/20 0718 08/02/20 1223  BP: 137/88 (!) 141/85 133/85 (!) 121/95  Pulse: (!) 58 (!) 54 (!) 57 68  Resp: 18 16 16 16   Temp: 97.7 F (36.5 C) 97.9 F  (36.6 C) 98.6 F (37 C) 98 F (36.7 C)  TempSrc: Oral Oral Oral Oral  SpO2: 95% 99% 98% 97%  Weight:      Height:         GEN: NAD SKIN: No rash.  Incisional wound on the lower back has healed nicely. EYES: EOMI ENT: MMM CV: RRR PULM: CTA B ABD: soft, ND, NT, +BS CNS: AAO x 3, left leg weakness (mild 4/5) EXT: No edema or tenderness   The results of significant diagnostics from this hospitalization (including imaging, microbiology, ancillary and laboratory) are listed below for reference.     Procedures and Diagnostic Studies:   DG Lumbar Spine 2-3 Views  Result Date: 07/29/2020 CLINICAL DATA:  Lumbar laminectomy/decompression. EXAM: LUMBAR SPINE - 2-3 VIEW COMPARISON:  None. FINDINGS: Three fluoroscopic images were obtained. On the first fluoroscopic image, a surgical instrument is seen posteriorly at the L5 level. The second image is an AP image with a marker to the right of L4 and a surgical device over L3. On the final image, there is a the posterior surgical marker. The level cannot be identified with complete confidence but I believe identified level is likely L3. IMPRESSION: Fluoroscopic images during surgery as above. Electronically Signed   By: Gerome Samavid  Williams III M.D   On: 07/29/2020 16:05   DG Tibia/Fibula Left  Result Date: 07/28/2020 CLINICAL DATA:  Left leg pain EXAM: LEFT  TIBIA AND FIBULA - 2 VIEW COMPARISON:  None. FINDINGS: Frontal and lateral views of the left tibia and fibula demonstrate no fractures. Alignment is anatomic. Medial and lateral compartmental osteoarthritis within the left knee, with extensive chondrocalcinosis. Mild osteoarthritis of the tibiotalar joint. Soft tissues are unremarkable. IMPRESSION: 1. Osteoarthritis of the left knee and ankle. No acute bony abnormality. Electronically Signed   By: Sharlet Salina M.D.   On: 07/28/2020 18:55   CT Head Wo Contrast  Result Date: 07/28/2020 CLINICAL DATA:  Neuro deficit, stroke suspected EXAM: CT HEAD  WITHOUT CONTRAST TECHNIQUE: Contiguous axial images were obtained from the base of the skull through the vertex without intravenous contrast. COMPARISON:  None. FINDINGS: Brain: No evidence of acute infarction, hemorrhage, hydrocephalus, extra-axial collection, visible mass lesion or mass effect. Parenchymal volume is likely normal for age. Midline structures are normal. Basal cisterns are patent. Cerebellar tonsils are normally position. Vascular: Atherosclerotic calcification of the carotid siphons. No hyperdense vessel. Skull: No calvarial fracture or suspicious osseous lesion. No scalp swelling or hematoma. Sinuses/Orbits: Paranasal sinuses and mastoid air cells are predominantly clear. Included orbital structures are unremarkable. Other: None IMPRESSION: 1. No acute intracranial abnormality. If there is persisting clinical concern for infarct, MRI is more sensitive and specific for early changes of ischemia. Electronically Signed   By: Kreg Shropshire M.D.   On: 07/28/2020 18:24   MR LUMBAR SPINE WO CONTRAST  Result Date: 07/28/2020 CLINICAL DATA:  Low back pain, progressive neurologic deficit. Additional history provided: Patient reports hip pain, progressively worsening and radiating into left knee. EXAM: MRI LUMBAR SPINE WITHOUT CONTRAST TECHNIQUE: Multiplanar, multisequence MR imaging of the lumbar spine was performed. No intravenous contrast was administered. COMPARISON:  Lumbar spine MRI 01/04/2011 FINDINGS: Segmentation: For the purposes of this dictation, five lumbar vertebrae are assumed and the caudal most well-formed intervertebral disc is designated L5-S1. Alignment:  Mild L1-L2, L2-L3 and L5-S1 grade 1 retrolisthesis. Vertebrae: Vertebral body height is maintained. Moderate marrow edema is present within the left L4 and L5 pedicles and articular pillars as well as L4 spinous process. This edema is likely degenerative in the absence of any signs or symptoms of infection. Conus medullaris and cauda  equina: Conus extends to the T12-L1 level. No signal abnormality within the visualized distal spinal cord. Paraspinal and other soft tissues: Small T2 hyperintense renal lesions bilaterally, incompletely assessed but possibly reflecting cysts. The partially imaged urinary bladder is distended paraspinal soft tissues within normal limits. Disc levels: Unless otherwise stated, the level by level findings below have not significantly changed since prior MRI 01/04/2011. Progressive moderate L5-S1 disc degeneration. No more than mild disc degeneration at the remaining levels. T11-T12: This level is imaged sagittally. Small disc bulge. No significant spinal canal or foraminal stenosis. T12-L1: Small disc bulge. No significant spinal canal or foraminal stenosis. L1-L2: Disc bulge. Superimposed tiny right center disc protrusion. A previously demonstrated left center disc protrusion has regressed and is no longer appreciated. Mild facet arthrosis/ligamentum flavum hypertrophy. Mild bilateral subarticular and central canal narrowing without nerve root impingement. Mild bilateral neural foraminal narrowing, new from the prior exam. L2-L3: Posterior annular fissure. Disc bulge. Mild facet arthrosis/ligamentum flavum hypertrophy. Mild bilateral subarticular and mild/moderate central canal stenosis, not significantly changed. Bilateral neural foraminal narrowing, unchanged (mild right, moderate left). L3-L4: Progressive disc bulge. New superimposed left subarticular/foraminal disc extrusion. Progressive moderate facet arthrosis with ligamentum flavum hypertrophy. The disc protrusion contributes to moderate left subarticular stenosis, crowding the descending left L4 nerve root. Progressive mild  right subarticular and central canal stenosis. Progressive moderate/severe bilateral neural foraminal narrowing. Additionally, there is extruded disc material posterior to the L4 vertebral body on the left (series 5, image 9). It is unclear  if this extruded disc material is arising from the L3-L4 or L4-L5 discs and sequestered disc material cannot be excluded. L4-L5: Progressive disc bulge. New left foraminal/extraforaminal disc protrusion (series 8, image 23) (series 5, image 11). Progressive moderate facet arthrosis with ligamentum flavum hypertrophy. Trace left facet joint effusion. Progressive mild left subarticular narrowing without nerve root impingement. Progressive bilateral neural foraminal narrowing (mild right, moderate/severe left). L5-S1: Disc bulge. Redemonstrated broad-based disc extrusion spanning the central and subarticular zones with minimal caudal migration. Progressive moderate facet arthrosis and left greater than right ligamentum flavum hypertrophy. As before, the disc extrusion contributes to moderate/severe left subarticular stenosis, encroaching upon the descending left S1 nerve root. Mild right subarticular and central canal narrowing. No significant foraminal stenosis IMPRESSION: Lumbar spondylosis as outlined and having progressed at several levels since the prior MRI of 01/04/2011. Findings are most notably as follows. At L3-L4, there is a new left subarticular/foraminal disc extrusion which contributes to moderate left subarticular stenosis, crowding the descending left L4 nerve root. This also contributes to moderate/severe left neural foraminal narrowing. Multifactorial progressive moderate/severe right neural foraminal narrowing. Additionally, there is extruded disc material posterior to the L4 vertebral body on the left. It is unclear if this extruded disc material has arisen from the L3-L4 or L4-L5 discs and sequestered disc material cannot be excluded. At L4-L5, there is a new left foraminal/extraforaminal disc protrusion. This contributes to progressive moderate/severe left neural foraminal narrowing. It also contributes to mild left subarticular narrowing without nerve root impingement. At L5-S1, redemonstrated  broad-based central disc extrusion spanning the central and bilateral subarticular zones with minimal caudal migration. As before, the disc extrusion contributes to moderate/severe left subarticular stenosis, encroaching upon the descending left S1 nerve root. Also new from the prior MRI, there is moderate edema within the left L4 and L5 pedicles/articular pillars and L4 spinous process. This edema is likely degenerative in the absence of any signs or symptoms of infection. Trace left L3-L4 and L4-L5 facet joint effusions. Electronically Signed   By: Jackey Loge DO   On: 07/28/2020 16:54   DG C-Arm 1-60 Min-No Report  Result Date: 07/29/2020 Fluoroscopy was utilized by the requesting physician.  No radiographic interpretation.   DG C-Arm 1-60 Min-No Report  Result Date: 07/29/2020 Fluoroscopy was utilized by the requesting physician.  No radiographic interpretation.   DG Hip Unilat W or Wo Pelvis 2-3 Views Left  Result Date: 07/28/2020 CLINICAL DATA:  Worsening left hip pain now radiating to left knee EXAM: DG HIP (WITH OR WITHOUT PELVIS) 2-3V LEFT COMPARISON:  None. FINDINGS: Prior bilateral total hip arthroplasties with screw fixed acetabular components. Hardware is in expected alignment. Some heterotopic ossification is noted about the acetabula and proximal femoral sites which can be seen post surgically. No acute periprosthetic fracture or complication is evident. No evidence of hardware failure or loosening. Remaining bones of the pelvis appear intact and congruent with partial ankylosis across the bilateral SI joints. Arcuate lines are contiguous. Degenerative features are present in the lumbar spine. Vascular calcifications and phleboliths noted in the pelvis. Bowel gas pattern is unremarkable. IMPRESSION: 1. Prior bilateral total hip arthroplasties without evidence of hardware complication. 2. No acute osseous abnormality. 3. Partial ankylosis across the SI joints. Electronically Signed   By:  Coralie Keens.D.  On: 07/28/2020 18:58     Labs:   Basic Metabolic Panel: Recent Labs  Lab 07/29/20 0449 07/29/20 0449 07/30/20 0609 07/30/20 0609 07/31/20 0408 07/31/20 0408 08/01/20 0442 08/02/20 0504  NA 139  --  136  --  137  --  136 136  K 4.3   < > 4.8   < > 4.7   < > 4.3 4.7  CL 105  --  104  --  106  --  103 100  CO2 24  --  25  --  24  --  25 28  GLUCOSE 150*  --  122*  --  127*  --  88 127*  BUN 35*  --  31*  --  30*  --  28* 25*  CREATININE 1.16  --  1.09  --  1.03  --  0.89 0.90  CALCIUM 9.1  --  8.5*  --  8.6*  --  8.5* 9.0   < > = values in this interval not displayed.   GFR Estimated Creatinine Clearance: 77.5 mL/min (by C-G formula based on SCr of 0.9 mg/dL). Liver Function Tests: Recent Labs  Lab 07/28/20 1931  AST 37  ALT 23  ALKPHOS 73  BILITOT 1.0  PROT 8.5*  ALBUMIN 3.9   No results for input(s): LIPASE, AMYLASE in the last 168 hours. No results for input(s): AMMONIA in the last 168 hours. Coagulation profile No results for input(s): INR, PROTIME in the last 168 hours.  CBC: Recent Labs  Lab 07/29/20 0449 07/30/20 0609 07/31/20 0408 08/01/20 0442 08/02/20 0504  WBC 4.4 14.5* 12.2* 11.2* 12.4*  HGB 13.2 12.2* 12.5* 11.4* 12.6*  HCT 41.2 37.9* 37.5* 35.5* 39.0  MCV 82.6 82.8 81.9 82.8 82.5  PLT 309 295 283 271 302   Cardiac Enzymes: No results for input(s): CKTOTAL, CKMB, CKMBINDEX, TROPONINI in the last 168 hours. BNP: Invalid input(s): POCBNP CBG: No results for input(s): GLUCAP in the last 168 hours. D-Dimer No results for input(s): DDIMER in the last 72 hours. Hgb A1c No results for input(s): HGBA1C in the last 72 hours. Lipid Profile No results for input(s): CHOL, HDL, LDLCALC, TRIG, CHOLHDL, LDLDIRECT in the last 72 hours. Thyroid function studies No results for input(s): TSH, T4TOTAL, T3FREE, THYROIDAB in the last 72 hours.  Invalid input(s): FREET3 Anemia work up No results for input(s): VITAMINB12, FOLATE,  FERRITIN, TIBC, IRON, RETICCTPCT in the last 72 hours. Microbiology Recent Results (from the past 240 hour(s))  Resp Panel by RT-PCR (Flu A&B, Covid) Nasopharyngeal Swab     Status: None   Collection Time: 07/28/20  7:31 PM   Specimen: Nasopharyngeal Swab; Nasopharyngeal(NP) swabs in vial transport medium  Result Value Ref Range Status   SARS Coronavirus 2 by RT PCR NEGATIVE NEGATIVE Final    Comment: (NOTE) SARS-CoV-2 target nucleic acids are NOT DETECTED.  The SARS-CoV-2 RNA is generally detectable in upper respiratory specimens during the acute phase of infection. The lowest concentration of SARS-CoV-2 viral copies this assay can detect is 138 copies/mL. A negative result does not preclude SARS-Cov-2 infection and should not be used as the sole basis for treatment or other patient management decisions. A negative result may occur with  improper specimen collection/handling, submission of specimen other than nasopharyngeal swab, presence of viral mutation(s) within the areas targeted by this assay, and inadequate number of viral copies(<138 copies/mL). A negative result must be combined with clinical observations, patient history, and epidemiological information. The expected result is Negative.  Fact  Sheet for Patients:  BloggerCourse.com  Fact Sheet for Healthcare Providers:  SeriousBroker.it  This test is no t yet approved or cleared by the [place] FDA and  has been authorized for detection and/or diagnosis of SARS-CoV-2 by FDA under an Emergency Use Authorization (EUA). This EUA will remain  in effect (meaning this test can be used) for the duration of the COVID-19 declaration under Section 564(b)(1) of the Act, 21 U.S.C.section 360bbb-3(b)(1), unless the authorization is terminated  or revoked sooner.       Influenza A by PCR NEGATIVE NEGATIVE Final   Influenza B by PCR NEGATIVE NEGATIVE Final    Comment:  (NOTE) The Xpert Xpress SARS-CoV-2/FLU/RSV plus assay is intended as an aid in the diagnosis of influenza from Nasopharyngeal swab specimens and should not be used as a sole basis for treatment. Nasal washings and aspirates are unacceptable for Xpert Xpress SARS-CoV-2/FLU/RSV testing.  Fact Sheet for Patients: BloggerCourse.com  Fact Sheet for Healthcare Providers: SeriousBroker.it  This test is not yet approved or cleared by the [place] FDA and has been authorized for detection and/or diagnosis of SARS-CoV-2 by FDA under an Emergency Use Authorization (EUA). This EUA will remain in effect (meaning this test can be used) for the duration of the COVID-19 declaration under Section 564(b)(1) of the Act, 21 U.S.C. section 360bbb-3(b)(1), unless the authorization is terminated or revoked.  Performed at [facility], [address], [place], [place] [address]      Discharge Instructions:   Discharge Instructions    Diet - low sodium heart healthy   Complete by: As directed    Discharge wound care:   Complete by: As directed    Keep wound clean and dry   Increase activity slowly   Complete by: As directed      Allergies as of 08/02/2020   No Known Allergies     Medication List    STOP taking these medications   ibuprofen 600 MG tablet Commonly known as: ADVIL     TAKE these medications   albuterol 108 (90 Base) MCG/ACT inhaler Commonly known as: ProAir HFA Inhale 2 puffs into the lungs every 6 (six) hours as needed for wheezing or shortness of breath.   aspirin 81 MG tablet Take 1 tablet (81 mg total) by mouth daily.   cholecalciferol 1000 units tablet Commonly known as: VITAMIN D Take 1,000 Units by mouth daily.   Fluticasone-Salmeterol 250-50 MCG/DOSE Aepb Commonly known as: Advair Diskus Inhale 1 puff into the lungs 2 (two) times daily.   gabapentin 300 MG capsule Commonly known as:  NEURONTIN Take 1 capsule (300 mg total) by mouth 3 (three) times daily.   lisinopril 10 MG tablet Commonly known as: ZESTRIL Take 1 tablet (10 mg total) by mouth daily.   multivitamin tablet Take 1 tablet by mouth daily.            Durable Medical Equipment  (From admission, onward)         Start     Ordered   07/31/20 1016  For home use only DME 3 n 1  Once        07/31/20 1016   07/31/20 0917  For home use only DME Bedside commode  Once       Question:  Patient needs a bedside commode to treat with the following condition  Answer:  Generalized weakness   07/31/20 0916           Discharge Care Instructions  (From admission,  onward)         Start     Ordered   08/02/20 0000  Discharge wound care:       Comments: Keep wound clean and dry   08/02/20 1244          Follow-up Information    Venetia Night, MD. Schedule an appointment as soon as possible for a visit in 1 week(s).   Specialty: Neurosurgery Contact information: [address] [place] [address] [phone]                Time coordinating discharge: 32 minutes  Signed:  Lurene Shadow  [facility] 08/02/2020, 12:44 PM   Pager on [email or web address]. If 7PM-7AM, please contact night-coverage at [email or web address]

## 2020-08-02 NOTE — Progress Notes (Signed)
Discharge education reviewed with pt and wife.  Verbalized understanding.  VSS, No s/s of distress. Pt transported home in private vehicle via wife.

## 2020-08-02 NOTE — Progress Notes (Signed)
Physical Therapy Treatment Patient Details Name: David Quinn MRN: [id number] DOB: 1955/12/03 Today's Date: 08/02/2020    History of Present Illness David Quinn is a 64 y.o. male who presents with the chief complaint of lower back pain, left leg pain, and left leg weakness.  He is s/p lumbar laminectomy on 07/29/20.    PT Comments    Pt was long sitting in bed upon arriving. He continues to be extremely pleasant and motivated throughout. A and O x 4.Pt was able to exit L side of bed with increased time and supervision only. Stood and ambulate with RW with supervision. Trial gait training with SPC. He has L knee buckling 3 x within ~ 50 Ft distance. He is unsafe to ambulate I'ly without BUE support. Recommend DC home with HHPT to follow to continue to imporve balance, strength, and safety with ambulation. At conclusion of session, pt was in recliner with call bell in reach and pt resting comfortably.     Follow Up Recommendations  Home health PT     Equipment Recommendations  Rolling walker with 5" wheels;3in1 (PT)    Recommendations for Other Services       Precautions / Restrictions Precautions Precautions: Fall;Back Precaution Booklet Issued: No Precaution Comments: L knee buckling Restrictions Weight Bearing Restrictions: No    Mobility  Bed Mobility Overal bed mobility: Needs Assistance Bed Mobility: Supine to Sit;Rolling;Sidelying to Sit Rolling: Supervision Sidelying to sit: Supervision       General bed mobility comments: Pt was able to exit L side of bed without physical assistance however did require increased time and supervision  Transfers Overall transfer level: Needs assistance Equipment used: Rolling walker (2 wheeled) Transfers: Sit to/from Stand Sit to Stand: Supervision         General transfer comment: pt was able to safely STS from EOB to RW with supervision.   Ambulation/Gait Ambulation/Gait assistance: Min assist;Supervision Gait  Distance (Feet): 200 Feet Assistive device: Rolling walker (2 wheeled);Straight cane Gait Pattern/deviations: Step-through pattern Gait velocity: WNL with RW, extremely decreased with SPC   General Gait Details: Pt ambulated 200 ft with RW with supervision without knee buckling prevent. He ambulated ~ 50 ft with SPC with occasional min assist due to L knee buckling. Highly recommend use of RW until knee buckling improves. Pt states understanding and has great safety awareness and great insight of safety concerns       Balance Overall balance assessment: Needs assistance Sitting-balance support: No upper extremity supported;Feet supported Sitting balance-Leahy Scale: Good     Standing balance support: Single extremity supported;During functional activity Standing balance-Leahy Scale: Fair Standing balance comment: pt does have knee buckling in LLE that makes him high fall risk without  BUE support. good balance with BUE support on RW       Cognition Arousal/Alertness: Awake/alert Behavior During Therapy: WFL for tasks assessed/performed Overall Cognitive Status: Within Functional Limits for tasks assessed      General Comments: Pt is A and O x 4. Very pleasant and agreeable to session             Pertinent Vitals/Pain Pain Assessment: No/denies pain           PT Goals (current goals can now be found in the care plan section) Acute Rehab PT Goals Patient Stated Goal: to get back to work Progress towards PT goals: Progressing toward goals    Frequency    BID      PT Plan Current plan remains  appropriate       AM-PAC PT "6 Clicks" Mobility   Outcome Measure  Help needed turning from your back to your side while in a flat bed without using bedrails?: None Help needed moving from lying on your back to sitting on the side of a flat bed without using bedrails?: None Help needed moving to and from a bed to a chair (including a wheelchair)?: A Little Help needed  standing up from a chair using your arms (e.g., wheelchair or bedside chair)?: A Little Help needed to walk in hospital room?: A Little Help needed climbing 3-5 steps with a railing? : A Little 6 Click Score: 20    End of Session Equipment Utilized During Treatment: Gait belt (issued pt personal RW ) Activity Tolerance: Patient tolerated treatment well Patient left: with call bell/phone within reach;in chair Nurse Communication: Mobility status PT Visit Diagnosis: Unsteadiness on feet (R26.81);Other abnormalities of gait and mobility (R26.89);Muscle weakness (generalized) (M62.81);Difficulty in walking, not elsewhere classified (R26.2)     Time: 2446-2863 PT Time Calculation (min) (ACUTE ONLY): 30 min  Charges:  $Gait Training: 23-37 mins                     Jetta Lout PTA 08/02/20, 10:49 AM

## 2020-08-02 NOTE — Plan of Care (Signed)

## 2020-08-02 NOTE — Progress Notes (Signed)
Occupational Therapy Treatment Patient Details Name: David Quinn MRN: [id number] DOB: February 14, 1956 Today's Date: 08/02/2020    History of present illness RITHVIK ORCUTT is a 64 y.o. male who presents with the chief complaint of lower back pain, left leg pain, and left leg weakness.  He is s/p lumbar laminectomy on 07/29/20.   OT comments  Mr. Mousel was seen for OT treatment on this date. Upon arrival to room pt awake/alert, semi-supine in bed with spouse present at bedside. Pt agreeable to OT tx session, and immediately return verbalizes understanding of use of LH reacher and sock aid for LB ADL management upon seeing items carried in by this author. Pt declines opportunity to trial AE for LB ADL, but demonstrates excellent recall of education provided at past sessions verbally. Pt and provider discuss falls prevention techniques with consideration of pt difficulties with L knee buckling as noted in PT session earlier this date. Pt requesting information on a knee brace to support safety and minimize knee buckling. Pt encouraged to speak with MD about this issue. Therapist provides further education on importance of use of RW for all functional mobility upon DC home to maximize pt safety, minimize falls risk, and support functional independence. Pt verbalized understanding education provided. Pt making good progress toward goals and continues to benefit from skilled OT services to maximize return to PLOF and minimize risk of future falls, injury, caregiver burden, and readmission. Will continue to follow POC. Discharge recommendation remains appropriate.    Follow Up Recommendations  Home health OT    Equipment Recommendations  3 in 1 bedside commode    Recommendations for Other Services      Precautions / Restrictions Precautions Precautions: Fall;Back Precaution Booklet Issued: No Precaution Comments: L knee buckling Restrictions Weight Bearing Restrictions: No        Mobility Bed Mobility Overal bed mobility: Needs Assistance Bed Mobility: Supine to Sit;Sit to Supine Rolling: Supervision Sidelying to sit: Supervision       General bed mobility comments: Cueing for safe use of log-roll technique.  Transfers Overall transfer level: Needs assistance Equipment used: Rolling walker (2 wheeled) Transfers: Sit to/from Stand Sit to Stand: Supervision         General transfer comment: Deferred. Pt declines functional mobility at this time.    Balance Overall balance assessment: Needs assistance Sitting-balance support: No upper extremity supported;Feet supported Sitting balance-Leahy Scale: Good     Standing balance support: Single extremity supported;During functional activity Standing balance-Leahy Scale: Fair Standing balance comment: pt does have knee buckling in LLE that makes him high fall risk without  BUE support. good balance with BUE support on RW                           ADL either performed or assessed with clinical judgement   ADL Overall ADL's : Needs assistance/impaired                                       General ADL Comments: Pt return verbalizes excellent understanding of use of AE to support safety and functional independence with ADL management. Discussed falls prevention strategies in consideration of ADL management and safe use of AE to support functional mobility. Pt at SUPERVISION level for all LB ADL mgt at this time.     Vision Patient Visual Report: No change from baseline  Perception     Praxis      Cognition Arousal/Alertness: Awake/alert Behavior During Therapy: WFL for tasks assessed/performed Overall Cognitive Status: Within Functional Limits for tasks assessed                                 General Comments: Pt is A and O x 4. Very pleasant and agreeable to session        Exercises Other Exercises Other Exercises: OT facilitates pt/caregiver  education on falls prevention strategies including safe use of AE/DME for ADL management, use of RW for functional mobility to maximize safety in consideration of L knee buckling during PT session, and considerations for safe footwear during functional mobility. Pt return verbalizes understanding with good recall/carryover from past sessions.   Shoulder Instructions       General Comments      Pertinent Vitals/ Pain       Pain Assessment: No/denies pain  Home Living                                          Prior Functioning/Environment              Frequency  Min 2X/week        Progress Toward Goals  OT Goals(current goals can now be found in the care plan section)  Progress towards OT goals: Progressing toward goals  Acute Rehab OT Goals Patient Stated Goal: to get back to work OT Goal Formulation: With patient Time For Goal Achievement: 08/13/20 Potential to Achieve Goals: Good  Plan Frequency remains appropriate;Discharge plan remains appropriate    Co-evaluation                 AM-PAC OT "6 Clicks" Daily Activity     Outcome Measure   Help from another person eating meals?: None Help from another person taking care of personal grooming?: A Little Help from another person toileting, which includes using toliet, bedpan, or urinal?: A Little Help from another person bathing (including washing, rinsing, drying)?: A Little Help from another person to put on and taking off regular upper body clothing?: None Help from another person to put on and taking off regular lower body clothing?: A Little 6 Click Score: 20    End of Session    OT Visit Diagnosis: Other abnormalities of gait and mobility (R26.89)   Activity Tolerance Patient tolerated treatment well   Patient Left with call bell/phone within reach;in chair   Nurse Communication          Time: 3532-9924 OT Time Calculation (min): 10 min  Charges: OT General Charges $OT  Visit: 1 Visit OT Treatments $Self Care/Home Management : 8-22 mins  Rockney Ghee, M.S., OTR/L Ascom: [phone] 08/02/20, 2:02 PM

## 2020-08-04 DIAGNOSIS — Z79891 Long term (current) use of opiate analgesic: Secondary | ICD-10-CM | POA: Diagnosis not present

## 2020-08-04 DIAGNOSIS — J4540 Moderate persistent asthma, uncomplicated: Secondary | ICD-10-CM | POA: Diagnosis not present

## 2020-08-04 DIAGNOSIS — Z7982 Long term (current) use of aspirin: Secondary | ICD-10-CM | POA: Diagnosis not present

## 2020-08-04 DIAGNOSIS — I10 Essential (primary) hypertension: Secondary | ICD-10-CM | POA: Diagnosis not present

## 2020-08-04 DIAGNOSIS — D709 Neutropenia, unspecified: Secondary | ICD-10-CM | POA: Diagnosis not present

## 2020-08-04 DIAGNOSIS — M1712 Unilateral primary osteoarthritis, left knee: Secondary | ICD-10-CM | POA: Diagnosis not present

## 2020-08-04 DIAGNOSIS — Z96643 Presence of artificial hip joint, bilateral: Secondary | ICD-10-CM | POA: Diagnosis not present

## 2020-08-04 DIAGNOSIS — Z4789 Encounter for other orthopedic aftercare: Secondary | ICD-10-CM | POA: Diagnosis not present

## 2020-08-04 DIAGNOSIS — M48061 Spinal stenosis, lumbar region without neurogenic claudication: Secondary | ICD-10-CM | POA: Diagnosis not present

## 2020-08-04 DIAGNOSIS — E785 Hyperlipidemia, unspecified: Secondary | ICD-10-CM | POA: Diagnosis not present

## 2020-08-04 DIAGNOSIS — Z9181 History of falling: Secondary | ICD-10-CM | POA: Diagnosis not present

## 2020-08-04 DIAGNOSIS — M19072 Primary osteoarthritis, left ankle and foot: Secondary | ICD-10-CM | POA: Diagnosis not present

## 2020-08-07 ENCOUNTER — Other Ambulatory Visit: Payer: Self-pay

## 2020-08-07 ENCOUNTER — Encounter: Payer: Self-pay | Admitting: Unknown Physician Specialty

## 2020-08-07 ENCOUNTER — Ambulatory Visit: Payer: BC Managed Care – PPO | Admitting: Unknown Physician Specialty

## 2020-08-07 DIAGNOSIS — M48061 Spinal stenosis, lumbar region without neurogenic claudication: Secondary | ICD-10-CM | POA: Diagnosis not present

## 2020-08-07 DIAGNOSIS — D709 Neutropenia, unspecified: Secondary | ICD-10-CM | POA: Diagnosis not present

## 2020-08-07 DIAGNOSIS — M1712 Unilateral primary osteoarthritis, left knee: Secondary | ICD-10-CM | POA: Diagnosis not present

## 2020-08-07 DIAGNOSIS — Z96643 Presence of artificial hip joint, bilateral: Secondary | ICD-10-CM | POA: Diagnosis not present

## 2020-08-07 DIAGNOSIS — Z79891 Long term (current) use of opiate analgesic: Secondary | ICD-10-CM | POA: Diagnosis not present

## 2020-08-07 DIAGNOSIS — Z4789 Encounter for other orthopedic aftercare: Secondary | ICD-10-CM | POA: Diagnosis not present

## 2020-08-07 DIAGNOSIS — E785 Hyperlipidemia, unspecified: Secondary | ICD-10-CM | POA: Diagnosis not present

## 2020-08-07 DIAGNOSIS — I10 Essential (primary) hypertension: Secondary | ICD-10-CM | POA: Diagnosis not present

## 2020-08-07 DIAGNOSIS — Z9181 History of falling: Secondary | ICD-10-CM | POA: Diagnosis not present

## 2020-08-07 DIAGNOSIS — M19072 Primary osteoarthritis, left ankle and foot: Secondary | ICD-10-CM | POA: Diagnosis not present

## 2020-08-07 DIAGNOSIS — Z7982 Long term (current) use of aspirin: Secondary | ICD-10-CM | POA: Diagnosis not present

## 2020-08-07 DIAGNOSIS — J4540 Moderate persistent asthma, uncomplicated: Secondary | ICD-10-CM | POA: Diagnosis not present

## 2020-08-09 DIAGNOSIS — D709 Neutropenia, unspecified: Secondary | ICD-10-CM | POA: Diagnosis not present

## 2020-08-09 DIAGNOSIS — Z4789 Encounter for other orthopedic aftercare: Secondary | ICD-10-CM | POA: Diagnosis not present

## 2020-08-09 DIAGNOSIS — Z96643 Presence of artificial hip joint, bilateral: Secondary | ICD-10-CM | POA: Diagnosis not present

## 2020-08-09 DIAGNOSIS — J4540 Moderate persistent asthma, uncomplicated: Secondary | ICD-10-CM | POA: Diagnosis not present

## 2020-08-09 DIAGNOSIS — Z7982 Long term (current) use of aspirin: Secondary | ICD-10-CM | POA: Diagnosis not present

## 2020-08-09 DIAGNOSIS — M19072 Primary osteoarthritis, left ankle and foot: Secondary | ICD-10-CM | POA: Diagnosis not present

## 2020-08-09 DIAGNOSIS — I10 Essential (primary) hypertension: Secondary | ICD-10-CM | POA: Diagnosis not present

## 2020-08-09 DIAGNOSIS — M48061 Spinal stenosis, lumbar region without neurogenic claudication: Secondary | ICD-10-CM | POA: Diagnosis not present

## 2020-08-09 DIAGNOSIS — E785 Hyperlipidemia, unspecified: Secondary | ICD-10-CM | POA: Diagnosis not present

## 2020-08-09 DIAGNOSIS — Z79891 Long term (current) use of opiate analgesic: Secondary | ICD-10-CM | POA: Diagnosis not present

## 2020-08-09 DIAGNOSIS — Z9181 History of falling: Secondary | ICD-10-CM | POA: Diagnosis not present

## 2020-08-09 DIAGNOSIS — M1712 Unilateral primary osteoarthritis, left knee: Secondary | ICD-10-CM | POA: Diagnosis not present

## 2020-08-10 NOTE — Progress Notes (Unsigned)
Nots is In error

## 2020-08-11 DIAGNOSIS — D709 Neutropenia, unspecified: Secondary | ICD-10-CM | POA: Diagnosis not present

## 2020-08-11 DIAGNOSIS — Z4789 Encounter for other orthopedic aftercare: Secondary | ICD-10-CM | POA: Diagnosis not present

## 2020-08-11 DIAGNOSIS — M19072 Primary osteoarthritis, left ankle and foot: Secondary | ICD-10-CM | POA: Diagnosis not present

## 2020-08-11 DIAGNOSIS — Z96643 Presence of artificial hip joint, bilateral: Secondary | ICD-10-CM | POA: Diagnosis not present

## 2020-08-11 DIAGNOSIS — Z79891 Long term (current) use of opiate analgesic: Secondary | ICD-10-CM | POA: Diagnosis not present

## 2020-08-11 DIAGNOSIS — M48061 Spinal stenosis, lumbar region without neurogenic claudication: Secondary | ICD-10-CM | POA: Diagnosis not present

## 2020-08-11 DIAGNOSIS — M1712 Unilateral primary osteoarthritis, left knee: Secondary | ICD-10-CM | POA: Diagnosis not present

## 2020-08-11 DIAGNOSIS — J4540 Moderate persistent asthma, uncomplicated: Secondary | ICD-10-CM | POA: Diagnosis not present

## 2020-08-11 DIAGNOSIS — Z9181 History of falling: Secondary | ICD-10-CM | POA: Diagnosis not present

## 2020-08-11 DIAGNOSIS — E785 Hyperlipidemia, unspecified: Secondary | ICD-10-CM | POA: Diagnosis not present

## 2020-08-11 DIAGNOSIS — I10 Essential (primary) hypertension: Secondary | ICD-10-CM | POA: Diagnosis not present

## 2020-08-11 DIAGNOSIS — Z7982 Long term (current) use of aspirin: Secondary | ICD-10-CM | POA: Diagnosis not present

## 2020-08-13 ENCOUNTER — Other Ambulatory Visit: Payer: Self-pay | Admitting: Nurse Practitioner

## 2020-08-15 DIAGNOSIS — I10 Essential (primary) hypertension: Secondary | ICD-10-CM | POA: Diagnosis not present

## 2020-08-15 DIAGNOSIS — Z9181 History of falling: Secondary | ICD-10-CM | POA: Diagnosis not present

## 2020-08-15 DIAGNOSIS — M19072 Primary osteoarthritis, left ankle and foot: Secondary | ICD-10-CM | POA: Diagnosis not present

## 2020-08-15 DIAGNOSIS — Z7982 Long term (current) use of aspirin: Secondary | ICD-10-CM | POA: Diagnosis not present

## 2020-08-15 DIAGNOSIS — E785 Hyperlipidemia, unspecified: Secondary | ICD-10-CM | POA: Diagnosis not present

## 2020-08-15 DIAGNOSIS — M48061 Spinal stenosis, lumbar region without neurogenic claudication: Secondary | ICD-10-CM | POA: Diagnosis not present

## 2020-08-15 DIAGNOSIS — Z79891 Long term (current) use of opiate analgesic: Secondary | ICD-10-CM | POA: Diagnosis not present

## 2020-08-15 DIAGNOSIS — J4540 Moderate persistent asthma, uncomplicated: Secondary | ICD-10-CM | POA: Diagnosis not present

## 2020-08-15 DIAGNOSIS — Z96643 Presence of artificial hip joint, bilateral: Secondary | ICD-10-CM | POA: Diagnosis not present

## 2020-08-15 DIAGNOSIS — D709 Neutropenia, unspecified: Secondary | ICD-10-CM | POA: Diagnosis not present

## 2020-08-15 DIAGNOSIS — M1712 Unilateral primary osteoarthritis, left knee: Secondary | ICD-10-CM | POA: Diagnosis not present

## 2020-08-15 DIAGNOSIS — Z4789 Encounter for other orthopedic aftercare: Secondary | ICD-10-CM | POA: Diagnosis not present

## 2020-08-17 DIAGNOSIS — Z79891 Long term (current) use of opiate analgesic: Secondary | ICD-10-CM | POA: Diagnosis not present

## 2020-08-17 DIAGNOSIS — M19072 Primary osteoarthritis, left ankle and foot: Secondary | ICD-10-CM | POA: Diagnosis not present

## 2020-08-17 DIAGNOSIS — Z96643 Presence of artificial hip joint, bilateral: Secondary | ICD-10-CM | POA: Diagnosis not present

## 2020-08-17 DIAGNOSIS — J4540 Moderate persistent asthma, uncomplicated: Secondary | ICD-10-CM | POA: Diagnosis not present

## 2020-08-17 DIAGNOSIS — Z9181 History of falling: Secondary | ICD-10-CM | POA: Diagnosis not present

## 2020-08-17 DIAGNOSIS — D709 Neutropenia, unspecified: Secondary | ICD-10-CM | POA: Diagnosis not present

## 2020-08-17 DIAGNOSIS — M48061 Spinal stenosis, lumbar region without neurogenic claudication: Secondary | ICD-10-CM | POA: Diagnosis not present

## 2020-08-17 DIAGNOSIS — I10 Essential (primary) hypertension: Secondary | ICD-10-CM | POA: Diagnosis not present

## 2020-08-17 DIAGNOSIS — Z4789 Encounter for other orthopedic aftercare: Secondary | ICD-10-CM | POA: Diagnosis not present

## 2020-08-17 DIAGNOSIS — E785 Hyperlipidemia, unspecified: Secondary | ICD-10-CM | POA: Diagnosis not present

## 2020-08-17 DIAGNOSIS — M1712 Unilateral primary osteoarthritis, left knee: Secondary | ICD-10-CM | POA: Diagnosis not present

## 2020-08-17 DIAGNOSIS — Z7982 Long term (current) use of aspirin: Secondary | ICD-10-CM | POA: Diagnosis not present

## 2020-09-14 ENCOUNTER — Other Ambulatory Visit
Admission: RE | Admit: 2020-09-14 | Discharge: 2020-09-14 | Disposition: A | Discharge status: Home / Self Care | Payer: BC Managed Care – PPO | Source: Ambulatory Visit | Attending: Sports Medicine | Admitting: Sports Medicine

## 2020-09-14 ENCOUNTER — Other Ambulatory Visit: Payer: Self-pay

## 2020-09-14 ENCOUNTER — Ambulatory Visit
Admission: RE | Admit: 2020-09-14 | Discharge: 2020-09-14 | Disposition: A | Discharge status: Home / Self Care | Payer: BC Managed Care – PPO | Source: Ambulatory Visit | Attending: Neurosurgery | Admitting: Neurosurgery

## 2020-09-14 ENCOUNTER — Other Ambulatory Visit: Payer: Self-pay | Admitting: Neurosurgery

## 2020-09-14 ENCOUNTER — Other Ambulatory Visit (HOSPITAL_COMMUNITY): Payer: Self-pay | Admitting: Neurosurgery

## 2020-09-14 DIAGNOSIS — M7989 Other specified soft tissue disorders: Secondary | ICD-10-CM

## 2020-09-14 DIAGNOSIS — M25562 Pain in left knee: Secondary | ICD-10-CM | POA: Insufficient documentation

## 2020-09-14 DIAGNOSIS — M1712 Unilateral primary osteoarthritis, left knee: Secondary | ICD-10-CM | POA: Diagnosis not present

## 2020-09-14 DIAGNOSIS — M25462 Effusion, left knee: Secondary | ICD-10-CM | POA: Diagnosis not present

## 2020-09-14 DIAGNOSIS — M1120 Other chondrocalcinosis, unspecified site: Secondary | ICD-10-CM | POA: Diagnosis not present

## 2020-09-14 LAB — SYNOVIAL CELL COUNT + DIFF, W/ CRYSTALS
Eosinophils-Synovial: 0 %
Lymphocytes-Synovial Fld: 2 %
Monocyte-Macrophage-Synovial Fluid: 31 %
Neutrophil, Synovial: 67 %
WBC, Synovial: 2803 /mm3 — ABNORMAL HIGH (ref 0–200)

## 2020-09-21 DIAGNOSIS — M25462 Effusion, left knee: Secondary | ICD-10-CM | POA: Diagnosis not present

## 2020-09-21 DIAGNOSIS — M1712 Unilateral primary osteoarthritis, left knee: Secondary | ICD-10-CM | POA: Diagnosis not present

## 2020-09-21 DIAGNOSIS — M25562 Pain in left knee: Secondary | ICD-10-CM | POA: Diagnosis not present

## 2020-09-21 DIAGNOSIS — M1120 Other chondrocalcinosis, unspecified site: Secondary | ICD-10-CM | POA: Diagnosis not present

## 2020-09-25 DIAGNOSIS — R200 Anesthesia of skin: Secondary | ICD-10-CM | POA: Diagnosis not present

## 2020-11-06 DIAGNOSIS — R29898 Other symptoms and signs involving the musculoskeletal system: Secondary | ICD-10-CM | POA: Diagnosis not present

## 2020-11-06 DIAGNOSIS — M5416 Radiculopathy, lumbar region: Secondary | ICD-10-CM | POA: Diagnosis not present

## 2020-11-07 ENCOUNTER — Other Ambulatory Visit (HOSPITAL_COMMUNITY): Payer: Self-pay | Admitting: Neurosurgery

## 2020-11-07 ENCOUNTER — Other Ambulatory Visit: Payer: Self-pay | Admitting: Neurosurgery

## 2020-11-07 DIAGNOSIS — R29898 Other symptoms and signs involving the musculoskeletal system: Secondary | ICD-10-CM

## 2020-11-07 DIAGNOSIS — M5416 Radiculopathy, lumbar region: Secondary | ICD-10-CM

## 2020-11-10 DIAGNOSIS — M5416 Radiculopathy, lumbar region: Secondary | ICD-10-CM | POA: Diagnosis not present

## 2020-11-10 DIAGNOSIS — R29898 Other symptoms and signs involving the musculoskeletal system: Secondary | ICD-10-CM | POA: Diagnosis not present

## 2020-11-13 DIAGNOSIS — M5416 Radiculopathy, lumbar region: Secondary | ICD-10-CM | POA: Diagnosis not present

## 2020-11-13 DIAGNOSIS — R29898 Other symptoms and signs involving the musculoskeletal system: Secondary | ICD-10-CM | POA: Diagnosis not present

## 2020-11-17 DIAGNOSIS — M5416 Radiculopathy, lumbar region: Secondary | ICD-10-CM | POA: Diagnosis not present

## 2020-11-17 DIAGNOSIS — R29898 Other symptoms and signs involving the musculoskeletal system: Secondary | ICD-10-CM | POA: Diagnosis not present

## 2020-11-20 DIAGNOSIS — M5416 Radiculopathy, lumbar region: Secondary | ICD-10-CM | POA: Diagnosis not present

## 2020-11-20 DIAGNOSIS — R29898 Other symptoms and signs involving the musculoskeletal system: Secondary | ICD-10-CM | POA: Diagnosis not present

## 2020-11-21 ENCOUNTER — Ambulatory Visit
Admission: RE | Admit: 2020-11-21 | Discharge: 2020-11-21 | Disposition: A | Discharge status: Home / Self Care | Payer: BC Managed Care – PPO | Source: Ambulatory Visit | Attending: Neurosurgery | Admitting: Neurosurgery

## 2020-11-21 ENCOUNTER — Other Ambulatory Visit: Payer: Self-pay

## 2020-11-21 DIAGNOSIS — M48061 Spinal stenosis, lumbar region without neurogenic claudication: Secondary | ICD-10-CM | POA: Diagnosis not present

## 2020-11-21 DIAGNOSIS — M5416 Radiculopathy, lumbar region: Secondary | ICD-10-CM | POA: Diagnosis not present

## 2020-11-21 DIAGNOSIS — R29898 Other symptoms and signs involving the musculoskeletal system: Secondary | ICD-10-CM | POA: Insufficient documentation

## 2020-11-21 DIAGNOSIS — M47816 Spondylosis without myelopathy or radiculopathy, lumbar region: Secondary | ICD-10-CM | POA: Diagnosis not present

## 2020-11-21 DIAGNOSIS — R531 Weakness: Secondary | ICD-10-CM | POA: Diagnosis not present

## 2020-11-21 DIAGNOSIS — M5126 Other intervertebral disc displacement, lumbar region: Secondary | ICD-10-CM | POA: Diagnosis not present

## 2020-11-21 MED ORDER — GADOBUTROL 1 MMOL/ML IV SOLN
7.0000 mL | Freq: Once | INTRAVENOUS | Status: AC | PRN
  Administered 2020-11-21: 7 mL via INTRAVENOUS

## 2020-11-24 DIAGNOSIS — R29898 Other symptoms and signs involving the musculoskeletal system: Secondary | ICD-10-CM | POA: Diagnosis not present

## 2020-11-24 DIAGNOSIS — M5416 Radiculopathy, lumbar region: Secondary | ICD-10-CM | POA: Diagnosis not present

## 2020-11-27 DIAGNOSIS — R29898 Other symptoms and signs involving the musculoskeletal system: Secondary | ICD-10-CM | POA: Diagnosis not present

## 2020-11-27 DIAGNOSIS — M5416 Radiculopathy, lumbar region: Secondary | ICD-10-CM | POA: Diagnosis not present

## 2020-12-01 DIAGNOSIS — R29898 Other symptoms and signs involving the musculoskeletal system: Secondary | ICD-10-CM | POA: Diagnosis not present

## 2020-12-01 DIAGNOSIS — M5416 Radiculopathy, lumbar region: Secondary | ICD-10-CM | POA: Diagnosis not present

## 2020-12-04 DIAGNOSIS — M5416 Radiculopathy, lumbar region: Secondary | ICD-10-CM | POA: Diagnosis not present

## 2020-12-04 DIAGNOSIS — R29898 Other symptoms and signs involving the musculoskeletal system: Secondary | ICD-10-CM | POA: Diagnosis not present

## 2020-12-06 ENCOUNTER — Encounter: Payer: BC Managed Care – PPO | Admitting: Nurse Practitioner

## 2020-12-06 DIAGNOSIS — R29898 Other symptoms and signs involving the musculoskeletal system: Secondary | ICD-10-CM | POA: Diagnosis not present

## 2020-12-06 DIAGNOSIS — M5416 Radiculopathy, lumbar region: Secondary | ICD-10-CM | POA: Diagnosis not present

## 2020-12-08 ENCOUNTER — Encounter: Payer: Self-pay | Admitting: Nurse Practitioner

## 2020-12-08 DIAGNOSIS — Z9889 Other specified postprocedural states: Secondary | ICD-10-CM | POA: Insufficient documentation

## 2020-12-11 DIAGNOSIS — R29898 Other symptoms and signs involving the musculoskeletal system: Secondary | ICD-10-CM | POA: Diagnosis not present

## 2020-12-11 DIAGNOSIS — M5416 Radiculopathy, lumbar region: Secondary | ICD-10-CM | POA: Diagnosis not present

## 2020-12-12 ENCOUNTER — Telehealth: Payer: Self-pay

## 2020-12-12 NOTE — Telephone Encounter (Signed)
Lvm to r/s 4/20 appt

## 2020-12-13 ENCOUNTER — Encounter: Payer: BC Managed Care – PPO | Admitting: Nurse Practitioner

## 2020-12-14 DIAGNOSIS — M5416 Radiculopathy, lumbar region: Secondary | ICD-10-CM | POA: Diagnosis not present

## 2020-12-14 DIAGNOSIS — R29898 Other symptoms and signs involving the musculoskeletal system: Secondary | ICD-10-CM | POA: Diagnosis not present

## 2020-12-29 ENCOUNTER — Other Ambulatory Visit: Payer: Self-pay

## 2020-12-29 ENCOUNTER — Encounter: Payer: Self-pay | Admitting: Nurse Practitioner

## 2020-12-29 ENCOUNTER — Ambulatory Visit (INDEPENDENT_AMBULATORY_CARE_PROVIDER_SITE_OTHER): Payer: BC Managed Care – PPO | Admitting: Nurse Practitioner

## 2020-12-29 VITALS — BP 124/80 | HR 80 | Temp 97.9°F | Ht 66.5 in | Wt 171.2 lb

## 2020-12-29 DIAGNOSIS — J4540 Moderate persistent asthma, uncomplicated: Secondary | ICD-10-CM

## 2020-12-29 DIAGNOSIS — I10 Essential (primary) hypertension: Secondary | ICD-10-CM

## 2020-12-29 DIAGNOSIS — E7800 Pure hypercholesterolemia, unspecified: Secondary | ICD-10-CM | POA: Diagnosis not present

## 2020-12-29 DIAGNOSIS — Z125 Encounter for screening for malignant neoplasm of prostate: Secondary | ICD-10-CM

## 2020-12-29 DIAGNOSIS — D649 Anemia, unspecified: Secondary | ICD-10-CM | POA: Diagnosis not present

## 2020-12-29 DIAGNOSIS — Z9889 Other specified postprocedural states: Secondary | ICD-10-CM

## 2020-12-29 DIAGNOSIS — D709 Neutropenia, unspecified: Secondary | ICD-10-CM | POA: Diagnosis not present

## 2020-12-29 DIAGNOSIS — Z0000 Encounter for general adult medical examination without abnormal findings: Secondary | ICD-10-CM

## 2020-12-29 LAB — MICROALBUMIN, URINE WAIVED
Creatinine, Urine Waived: 200 mg/dL (ref 10–300)
Microalb, Ur Waived: 80 mg/L — ABNORMAL HIGH (ref 0–19)

## 2020-12-29 MED ORDER — LISINOPRIL 10 MG PO TABS
ORAL_TABLET | ORAL | 4 refills | Status: DC
Start: 2020-12-29 — End: 2022-02-08

## 2020-12-29 MED ORDER — ALBUTEROL SULFATE HFA 108 (90 BASE) MCG/ACT IN AERS: 2.0000 | INHALATION_SPRAY | Freq: Four times a day (QID) | RESPIRATORY_TRACT | 3 refills | Status: DC | PRN

## 2020-12-29 MED ORDER — BREO ELLIPTA 100-25 MCG/INH IN AEPB: 1.0000 | INHALATION_SPRAY | Freq: Every day | RESPIRATORY_TRACT | 4 refills | Status: DC

## 2020-12-29 NOTE — Patient Instructions (Signed)
Preventing High Cholesterol Cholesterol is a white, waxy substance similar to fat that the human body needs to help build cells. The liver makes all the cholesterol that a person's body needs. Having high cholesterol (hypercholesterolemia) increases your risk for heart disease and stroke. Extra or excess cholesterol comes from the food that you eat. High cholesterol can often be prevented with diet and lifestyle changes. If you already have high cholesterol, you can control it with diet, lifestyle changes, and medicines. How can high cholesterol affect me? If you have high cholesterol, fatty deposits (plaques) may build up on the walls of your blood vessels. The blood vessels that carry blood away from your heart are called arteries. Plaques make the arteries narrower and stiffer. This in turn can:  Restrict or block blood flow and cause blood clots to form.  Increase your risk for heart attack and stroke. What can increase my risk for high cholesterol? This condition is more likely to develop in people who:  Eat foods that are high in saturated fat or cholesterol. Saturated fat is mostly found in foods that come from animal sources.  Are overweight.  Are not getting enough exercise.  Have a family history of high cholesterol (familial hypercholesterolemia). What actions can I take to prevent this? Nutrition  Eat less saturated fat.  Avoid trans fats (partially hydrogenated oils). These are often found in margarine and in some baked goods, fried foods, and snacks bought in packages.  Avoid precooked or cured meat, such as bacon, sausages, or meat loaves.  Avoid foods and drinks that have added sugars.  Eat more fruits, vegetables, and whole grains.  Choose healthy sources of protein, such as fish, poultry, lean cuts of red meat, beans, peas, lentils, and nuts.  Choose healthy sources of fat, such as: ? Nuts. ? Vegetable oils, especially olive oil. ? Fish that have healthy fats,  such as omega-3 fatty acids. These fish include mackerel or salmon.   Lifestyle  Lose weight if you are overweight. Maintaining a healthy body mass index (BMI) can help prevent or control high cholesterol. It can also lower your risk for diabetes and high blood pressure. Ask your health care provider to help you with a diet and exercise plan to lose weight safely.  Do not use any products that contain nicotine or tobacco, such as cigarettes, e-cigarettes, and chewing tobacco. If you need help quitting, ask your health care provider. Alcohol use  Do not drink alcohol if: ? Your health care provider tells you not to drink. ? You are pregnant, may be pregnant, or are planning to become pregnant.  If you drink alcohol: ? Limit how much you use to:  0-1 drink a day for women.  0-2 drinks a day for men. ? Be aware of how much alcohol is in your drink. In the [place] one drink equals one 12 oz bottle of beer (355 mL), one 5 oz glass of wine (148 mL), or one 1 oz glass of hard liquor (44 mL). Activity  Get enough exercise. Do exercises as told by your health care provider.  Each week, do at least 150 minutes of exercise that takes a medium level of effort (moderate-intensity exercise). This kind of exercise: ? Makes your heart beat faster while allowing you to still be able to talk. ? Can be done in short sessions several times a day or longer sessions a few times a week. For example, on 5 days each week, you could walk fast or ride   your bike 3 times a day for 10 minutes each time.   Medicines  Your health care provider may recommend medicines to help lower cholesterol. This may be a medicine to lower the amount of cholesterol that your liver makes. You may need medicine if: ? Diet and lifestyle changes have not lowered your cholesterol enough. ? You have high cholesterol and other risk factors for heart disease or stroke.  Take over-the-counter and prescription medicines only as told by your  health care provider. General information  Manage your risk factors for high cholesterol. Talk with your health care provider about all your risk factors and how to lower your risk.  Manage other conditions that you have, such as diabetes or high blood pressure (hypertension).  Have blood tests to check your cholesterol levels at regular points in time as told by your health care provider.  Keep all follow-up visits as told by your health care provider. This is important. Where to find more information  American Heart Association: www.heart.org  National Heart, Lung, and Blood Institute: www.nhlbi.nih.gov Summary  High cholesterol increases your risk for heart disease and stroke. By keeping your cholesterol level low, you can reduce your risk for these conditions.  High cholesterol can often be prevented with diet and lifestyle changes.  Work with your health care provider to manage your risk factors, and have your blood tested regularly. This information is not intended to replace advice given to you by your health care provider. Make sure you discuss any questions you have with your health care provider. Document Revised: 05/25/2019 Document Reviewed: 05/25/2019 Elsevier Patient Education  2021 [employer]  

## 2020-12-29 NOTE — Assessment & Plan Note (Addendum)
Chronic, stable with BP at goal on recheck. Continue current medication regimen and adjust as needed.  Discussed with patient,  if increase in Lisinopril needed will switch to Losartan due to underlying asthma, would avoid increasing ACE.  He agrees with this plan.  Recommend he check BP at least a few days a week at home and document.  Focus on DASH diet.  Obtain CMP, CBC, TSH today.  Refills sent in.  Return in 6 months.

## 2020-12-29 NOTE — Progress Notes (Signed)
BP 124/80 (BP Location: Left Arm)   Pulse 80   Temp 97.9 F (36.6 C) (Oral)   Ht 5' 6.5" (1.689 m)   Wt 171 lb 3.2 oz (77.7 kg)   SpO2 96%   BMI 27.22 kg/m    Subjective:    Patient ID: David Quinn, male    DOB: [birth year], 65 y.o.   MRN: [id number]  HPI: David Quinn is a 65 y.o. male presenting on 12/29/2020 for comprehensive medical examination. Current medical complaints include:none  He currently lives with: wife Interim Problems from his last visit: no   Had lumbar laminectomy on 07/29/20 with Dr. Myer Haff.  Continues to have left leg weakness being monitored by Dr. Myer Haff -- last saw him 12/14/20 -- is to follow-up in 3 months.  HYPERTENSION Continues on Lisinopril 10 MG daily without ADR. Hypertension status: stable  Satisfied with current treatment? yes Duration of hypertension: chronic BP monitoring frequency:  not checking BP range:  BP medication side effects:  no Medication compliance: good compliance Aspirin: yes Recurrent headaches: no Visual changes: no Palpitations: no Dyspnea: no Chest pain: no Lower extremity edema: no Dizzy/lightheaded: no   ASTHMA Continues Advair BID and Proair.  Has history of low WBC in October 2021 -- 3.3 and neuts 1.4, but WBC post-up has been elevated, with recent in December 12.4, H/H 12.6/39.0.  No B symptoms. Asthma status: stable Satisfied with current treatment?: yes Albuterol/rescue inhaler frequency: once a week recently due to pollen Dyspnea frequency: none Wheezing frequency: none Cough frequency:  none Nocturnal symptom frequency:  Limitation of activity: no Current upper respiratory symptoms: no Triggers: weather changes Home peak flows: Last Spirometry: unknown Failed/intolerant to following asthma meds: unknown Asthma meds in past:  Aerochamber/spacer use: no Visits to ER or Urgent Care in past year: no Pneumovax: Not up to Date Influenza: refuses   HYPERLIPIDEMIA No current  medications -- last LDL 153.  Has not ate today. Supplements: none Aspirin:  yes The 10-year ASCVD risk score Denman George DC Jr., et al., 2013) is: 14.1%   Values used to calculate the score:     Age: 17 years     Sex: Male     Is Non-Hispanic African American: Yes     Diabetic: No     Tobacco smoker: No     Systolic Blood Pressure: 124 mmHg     Is BP treated: Yes     HDL Cholesterol: 66 mg/dL     Total Cholesterol: 230 mg/dL Chest pain:  no Coronary artery disease:  no Family history CAD:  no Family history early CAD:  no  Functional Status Survey: Is the patient deaf or have difficulty hearing?: No Does the patient have difficulty seeing, even when wearing glasses/contacts?: No Does the patient have difficulty concentrating, remembering, or making decisions?: No Does the patient have difficulty walking or climbing stairs?: Yes (recent surgery) Does the patient have difficulty dressing or bathing?: No Does the patient have difficulty doing errands alone such as visiting a doctor's office or shopping?: No  FALL RISK: Fall Risk  11/19/2019 10/13/2018 10/06/2015  Falls in the past year? 0 0 No  Number falls in past yr: 0 0 -  Injury with Fall? 0 0 -  Follow up Falls evaluation completed - -    Depression Screen Depression screen [facility] 2/9 12/29/2020 11/19/2019 10/13/2018 10/08/2017 10/23/2016  Decreased Interest 0 0 0 0 0  Down, Depressed, Hopeless 0 0 0 0 0  PHQ - 2 Score  0 0 0 0 0  Altered sleeping - - - 0 -  Tired, decreased energy - - - 0 -  Change in appetite - - - 0 -  Feeling bad or failure about yourself  - - - 0 -  Trouble concentrating - - - 0 -  Moving slowly or fidgety/restless - - - 0 -  Suicidal thoughts - - - 0 -  PHQ-9 Score - - - 0 -    Advanced Directives <no information>  Past Medical History:  Past Medical History:  Diagnosis Date  . Asthma   . Hypertension     Surgical History:  Past Surgical History:  Procedure Laterality Date  . JOINT REPLACEMENT  Bilateral    hip replacement  . LUMBAR LAMINECTOMY/DECOMPRESSION MICRODISCECTOMY Left 07/29/2020   Procedure: LUMBAR LAMINECTOMY/DECOMPRESSION MICRODISCECTOMY 2 LEVELS;  Surgeon: Venetia Night, MD;  Location: [facility] ORS;  Service: Neurosurgery;  Laterality: Left;  . TRACHEOSTOMY      Medications:  Current Outpatient Medications on File Prior to Visit  Medication Sig  . aspirin 81 MG tablet Take 1 tablet (81 mg total) by mouth daily.  . cholecalciferol (VITAMIN D) 1000 UNITS tablet Take 1,000 Units by mouth daily.  . Multiple Vitamin (MULTIVITAMIN) tablet Take 1 tablet by mouth daily.   No current facility-administered medications on file prior to visit.    Allergies:  No Known Allergies  Social History:  Social History   Socioeconomic History  . Marital status: Married    Spouse name: Not on file  . Number of children: Not on file  . Years of education: Not on file  . Highest education level: Not on file  Occupational History  . Not on file  Tobacco Use  . Smoking status: Never Smoker  . Smokeless tobacco: Never Used  Vaping Use  . Vaping Use: Never used  Substance and Sexual Activity  . Alcohol use: No    Alcohol/week: 0.0 standard drinks  . Drug use: No  . Sexual activity: Yes  Other Topics Concern  . Not on file  Social History Narrative  . Not on file   Social Determinants of Health   Financial Resource Strain: Not on file  Food Insecurity: Not on file  Transportation Needs: Not on file  Physical Activity: Not on file  Stress: Not on file  Social Connections: Not on file  Intimate Partner Violence: Not on file   Social History   Tobacco Use  Smoking Status Never Smoker  Smokeless Tobacco Never Used   Social History   Substance and Sexual Activity  Alcohol Use No  . Alcohol/week: 0.0 standard drinks    Family History:  Family History  Problem Relation Age of Onset  . Asthma Mother   . Cancer Mother   . Hyperlipidemia Mother   .  Hypertension Mother   . Hypertension Father   . Stroke Father   . Cancer Brother        stomach  . Seizures Brother     Past medical history, surgical history, medications, allergies, family history and social history reviewed with patient today and changes made to appropriate areas of the chart.   Review of Systems - negative All other ROS negative except what is listed above and in the HPI.      Objective:    BP 124/80 (BP Location: Left Arm)   Pulse 80   Temp 97.9 F (36.6 C) (Oral)   Ht 5' 6.5" (1.689 m)   Wt 171 lb  3.2 oz (77.7 kg)   SpO2 96%   BMI 27.22 kg/m   Wt Readings from Last 3 Encounters:  12/29/20 171 lb 3.2 oz (77.7 kg)  08/07/20 162 lb 6 oz (73.7 kg)  07/28/20 165 lb (74.8 kg)    Physical Exam Vitals and nursing note reviewed.  Constitutional:      General: He is awake. He is not in acute distress.    Appearance: He is well-developed and well-groomed. He is not ill-appearing.  HENT:     Head: Normocephalic and atraumatic.     Right Ear: Hearing, tympanic membrane, ear canal and external ear normal. No drainage.     Left Ear: Hearing, tympanic membrane, ear canal and external ear normal. No drainage.     Nose: Nose normal.     Mouth/Throat:     Pharynx: Uvula midline.  Eyes:     General: Lids are normal.        Right eye: No discharge.        Left eye: No discharge.     Extraocular Movements: Extraocular movements intact.     Conjunctiva/sclera: Conjunctivae normal.     Pupils: Pupils are equal, round, and reactive to light.     Visual Fields: Right eye visual fields normal and left eye visual fields normal.  Neck:     Thyroid: No thyromegaly.     Vascular: No carotid bruit or JVD.     Trachea: Trachea normal.  Cardiovascular:     Rate and Rhythm: Normal rate and regular rhythm.     Heart sounds: Normal heart sounds, S1 normal and S2 normal. No murmur heard. No gallop.   Pulmonary:     Effort: Pulmonary effort is normal. No accessory muscle  usage or respiratory distress.     Breath sounds: Normal breath sounds.  Abdominal:     General: Bowel sounds are normal.     Palpations: Abdomen is soft. There is no hepatomegaly or splenomegaly.     Tenderness: There is no abdominal tenderness.  Genitourinary:    Comments: Deferred per patient request. Musculoskeletal:        General: Normal range of motion.     Cervical back: Normal range of motion and neck supple.     Right lower leg: No edema.     Left lower leg: No edema.  Lymphadenopathy:     Head:     Right side of head: No submental, submandibular, tonsillar, preauricular or posterior auricular adenopathy.     Left side of head: No submental, submandibular, tonsillar, preauricular or posterior auricular adenopathy.     Cervical: No cervical adenopathy.  Skin:    General: Skin is warm and dry.     Capillary Refill: Capillary refill takes less than 2 seconds.     Findings: No rash.  Neurological:     Mental Status: He is alert and oriented to person, place, and time.     Cranial Nerves: Cranial nerves are intact.     Gait: Gait is intact.     Deep Tendon Reflexes: Reflexes are normal and symmetric.     Reflex Scores:      Brachioradialis reflexes are 2+ on the right side and 2+ on the left side.      Patellar reflexes are 2+ on the right side and 2+ on the left side. Psychiatric:        Attention and Perception: Attention normal.        Mood and Affect: Mood normal.  Speech: Speech normal.        Behavior: Behavior normal. Behavior is cooperative.        Thought Content: Thought content normal.        Cognition and Memory: Cognition normal.        Judgment: Judgment normal.    Results for orders placed or performed during the hospital encounter of 09/14/20  Synovial cell count + diff, w/ crystals  Result Value Ref Range   Color, Synovial YELLOW (A) YELLOW   Appearance-Synovial CLOUDY (A) CLEAR   Crystals, Fluid INTRACELLULAR CALCIUM PYROPHOSPHATE CRYSTALS     WBC, Synovial 2,803 (H) 0 - 200 /cu mm   Neutrophil, Synovial 67 %   Lymphocytes-Synovial Fld 2 %   Monocyte-Macrophage-Synovial Fluid 31 %   Eosinophils-Synovial 0 %      Assessment & Plan:   Problem List Items Addressed This Visit      Cardiovascular and Mediastinum   Hypertension    Chronic, stable with BP at goal on recheck. Continue current medication regimen and adjust as needed.  Discussed with patient,  if increase in Lisinopril needed will switch to Losartan due to underlying asthma, would avoid increasing ACE.  He agrees with this plan.  Recommend he check BP at least a few days a week at home and document.  Focus on DASH diet.  Obtain CMP, CBC, TSH today.  Refills sent in.  Return in 6 months.      Relevant Medications   lisinopril (ZESTRIL) 10 MG tablet   Other Relevant Orders   Comprehensive metabolic panel   TSH   Microalbumin, Urine Waived     Respiratory   Asthma, moderate persistent    Chronic, ongoing.  At this time due to insurance coverage, [facility] and discontinue Advair.  Continue Albuterol as needed.  Minimal use of rescue inhaler.  Adjust regimen as needed based on symptoms.  Spirometry next visit.  CBC today.  Return in 6 months.      Relevant Medications   fluticasone furoate-vilanterol (BREO ELLIPTA) 100-25 MCG/INH AEPB   albuterol (PROAIR HFA) 108 (90 Base) MCG/ACT inhaler   Other Relevant Orders   CBC with Differential/Platelet     Other   Hyperlipidemia    Chronic, ongoing.  No current medications.  Lipid panel today.  He has refused statin in past, discussed further with him today his current ASCVD 14.1% and risks.  Will check panel and further discuss and recommend.      Relevant Medications   lisinopril (ZESTRIL) 10 MG tablet   Other Relevant Orders   Comprehensive metabolic panel   Lipid Panel w/o Chol/HDL Ratio   Neutropenia (HCC) - Primary    Noted on labs last year with improvement on recheck and post surgery.   Check CBC today.  No B symptoms.  Consider hematology referral if ongoing.      Relevant Orders   CBC with Differential/Platelet   History of back surgery    On 07/29/20, lumbar laminectomy.  At this time continue collaboration with neurosurgery, continues to have some ongoing symptoms post surgery.      Low hemoglobin    Noted on labs post-op with some improvement recent labs.  Will recheck today and check iron and ferritin.  Educated patient on this.      Relevant Orders   CBC with Differential/Platelet   Iron, TIBC and Ferritin Panel    Other Visit Diagnoses    Prostate cancer screening  PSA on labs today, patient aware and would like this checked.   Relevant Orders   PSA   Annual physical exam       Annual labs today, CBC/CMP/TSH/LIPID/PSA.  Health maintenance reviewed.      Discussed aspirin prophylaxis for myocardial infarction prevention and decision was made to continue ASA  LABORATORY TESTING:  Health maintenance labs ordered today as discussed above.   The natural history of prostate cancer and ongoing controversy regarding screening and potential treatment outcomes of prostate cancer has been discussed with the patient. The meaning of a false positive PSA and a false negative PSA has been discussed. He indicates understanding of the limitations of this screening test and wishes to proceed with screening PSA testing.   IMMUNIZATIONS:   - Tdap: Tetanus vaccination status reviewed: last tetanus booster within 10 years. - Influenza: Refused - Pneumovax: Not applicable - Prevnar: Not applicable - Zostavax vaccine: Refused -- will consider - Covid -- had Moderna x 2 -- will bring in card  SCREENING: - Colonoscopy: Up to date -- due July 2023 Discussed with patient purpose of the colonoscopy is to detect colon cancer at curable precancerous or early stages   - AAA Screening: Not applicable  -Hearing Test: Not applicable  -Spirometry: Refused   PATIENT  COUNSELING:    Sexuality: Discussed sexually transmitted diseases, partner selection, use of condoms, avoidance of unintended pregnancy  and contraceptive alternatives.   Advised to avoid cigarette smoking.  I discussed with the patient that most people either abstain from alcohol or drink within safe limits (<=14/week and <=4 drinks/occasion for males, <=7/weeks and <= 3 drinks/occasion for females) and that the risk for alcohol disorders and other health effects rises proportionally with the number of drinks per week and how often a drinker exceeds daily limits.  Discussed cessation/primary prevention of drug use and availability of treatment for abuse.   Diet: Encouraged to adjust caloric intake to maintain  or achieve ideal body weight, to reduce intake of dietary saturated fat and total fat, to limit sodium intake by avoiding high sodium foods and not adding table salt, and to maintain adequate dietary potassium and calcium preferably from fresh fruits, vegetables, and low-fat dairy products.    Stressed the importance of regular exercise  Injury prevention: Discussed safety belts, safety helmets, smoke detector, smoking near bedding or upholstery.   Dental health: Discussed importance of regular tooth brushing, flossing, and dental visits.   Follow up plan: NEXT PREVENTATIVE PHYSICAL DUE IN 1 YEAR. Return in about 6 months (around 07/01/2021) for HTN and ASTHMA -- spirometry.

## 2020-12-29 NOTE — Assessment & Plan Note (Signed)
Noted on labs last year with improvement on recheck and post surgery.  Check CBC today.  No B symptoms.  Consider hematology referral if ongoing.

## 2020-12-29 NOTE — Assessment & Plan Note (Signed)
On 07/29/20, lumbar laminectomy.  At this time continue collaboration with neurosurgery, continues to have some ongoing symptoms post surgery.

## 2020-12-29 NOTE — Assessment & Plan Note (Signed)
Chronic, ongoing.  No current medications.  Lipid panel today.  He has refused statin in past, discussed further with him today his current ASCVD 14.1% and risks.  Will check panel and further discuss and recommend.

## 2020-12-29 NOTE — Assessment & Plan Note (Addendum)
Noted on labs post-op with some improvement recent labs.  Will recheck today and check iron and ferritin.  Educated patient on this.

## 2020-12-29 NOTE — Assessment & Plan Note (Signed)
Chronic, ongoing.  At this time due to insurance coverage, will change maintenance to [facility] and discontinue Advair.  Continue Albuterol as needed.  Minimal use of rescue inhaler.  Adjust regimen as needed based on symptoms.  Spirometry next visit.  CBC today.  Return in 6 months.

## 2020-12-30 ENCOUNTER — Other Ambulatory Visit: Payer: Self-pay | Admitting: Nurse Practitioner

## 2020-12-30 LAB — COMPREHENSIVE METABOLIC PANEL
ALT: 20 IU/L (ref 0–44)
AST: 25 IU/L (ref 0–40)
Albumin/Globulin Ratio: 1.3 (ref 1.2–2.2)
Albumin: 4.3 g/dL (ref 3.8–4.8)
Alkaline Phosphatase: 92 IU/L (ref 44–121)
BUN/Creatinine Ratio: 15 (ref 10–24)
BUN: 15 mg/dL (ref 8–27)
Bilirubin Total: 0.4 mg/dL (ref 0.0–1.2)
CO2: 18 mmol/L — ABNORMAL LOW (ref 20–29)
Calcium: 9.7 mg/dL (ref 8.6–10.2)
Chloride: 104 mmol/L (ref 96–106)
Creatinine, Ser: 1.02 mg/dL (ref 0.76–1.27)
Globulin, Total: 3.3 g/dL (ref 1.5–4.5)
Glucose: 91 mg/dL (ref 65–99)
Potassium: 4.4 mmol/L (ref 3.5–5.2)
Sodium: 142 mmol/L (ref 134–144)
Total Protein: 7.6 g/dL (ref 6.0–8.5)
eGFR: 82 mL/min/{1.73_m2} (ref >59)

## 2020-12-30 LAB — CBC WITH DIFFERENTIAL/PLATELET
Basophils Absolute: 0 10*3/uL (ref 0.0–0.2)
Basos: 1 %
EOS (ABSOLUTE): 0.4 10*3/uL (ref 0.0–0.4)
Eos: 11 %
Hematocrit: 45.8 % (ref 37.5–51.0)
Hemoglobin: 14.6 g/dL (ref 13.0–17.7)
Immature Grans (Abs): 0 10*3/uL (ref 0.0–0.1)
Immature Granulocytes: 0 %
Lymphocytes Absolute: 1.1 10*3/uL (ref 0.7–3.1)
Lymphs: 36 %
MCH: 26.4 pg — ABNORMAL LOW (ref 26.6–33.0)
MCHC: 31.9 g/dL (ref 31.5–35.7)
MCV: 83 fL (ref 79–97)
Monocytes Absolute: 0.4 10*3/uL (ref 0.1–0.9)
Monocytes: 13 %
Neutrophils Absolute: 1.2 10*3/uL — ABNORMAL LOW (ref 1.4–7.0)
Neutrophils: 39 %
Platelets: 249 10*3/uL (ref 150–450)
RBC: 5.53 x10E6/uL (ref 4.14–5.80)
RDW: 12.7 % (ref 11.6–15.4)
WBC: 3.2 10*3/uL — ABNORMAL LOW (ref 3.4–10.8)

## 2020-12-30 LAB — LIPID PANEL W/O CHOL/HDL RATIO
Cholesterol, Total: 249 mg/dL — ABNORMAL HIGH (ref 100–199)
HDL: 58 mg/dL (ref >39)
LDL Chol Calc (NIH): 177 mg/dL — ABNORMAL HIGH (ref 0–99)
Triglycerides: 84 mg/dL (ref 0–149)
VLDL Cholesterol Cal: 14 mg/dL (ref 5–40)

## 2020-12-30 LAB — IRON,TIBC AND FERRITIN PANEL
Ferritin: 164 ng/mL (ref 30–400)
Iron Saturation: 22 % (ref 15–55)
Iron: 65 ug/dL (ref 38–169)
Total Iron Binding Capacity: 291 ug/dL (ref 250–450)
UIBC: 226 ug/dL (ref 111–343)

## 2020-12-30 LAB — PSA: Prostate Specific Ag, Serum: 2.1 ng/mL (ref 0.0–4.0)

## 2020-12-30 LAB — TSH: TSH: 1.81 u[IU]/mL (ref 0.450–4.500)

## 2020-12-30 MED ORDER — ROSUVASTATIN CALCIUM 10 MG PO TABS: 10.0000 mg | ORAL_TABLET | Freq: Every day | ORAL | 4 refills | Status: DC

## 2020-12-30 NOTE — Progress Notes (Signed)
Good morning, please let David Quinn know his labs have returned: - Kidney and liver function are normal, as are thyroid and prostate labs. - Cholesterol levels remain elevated and I do recommend starting statin to help lower your risk for stroke, as numbers are trending up.  I will send in a low dose of Rosuvastatin for you to start and if any issues with this let me know.   - CBC shows now anemia, but your white blood cell is back to trending on low side and neutrophils on lower side I would like you to return to see me in 4 weeks to recheck this and see how you are doing with cholesterol medication please.  If this white blood cell count remains on lower side I am going to place a referral to hematology for further assessment.  Please schedule a 4 week follow-up with me please.  Any questions? Keep being awesome!!  Thank you for allowing me to participate in your care.  I appreciate you. Kindest regards, Melissa Pulido

## 2021-01-26 ENCOUNTER — Encounter: Payer: Self-pay | Admitting: Nurse Practitioner

## 2021-01-26 DIAGNOSIS — R809 Proteinuria, unspecified: Secondary | ICD-10-CM | POA: Insufficient documentation

## 2021-01-29 ENCOUNTER — Ambulatory Visit: Payer: BC Managed Care – PPO | Admitting: Nurse Practitioner

## 2021-01-29 ENCOUNTER — Other Ambulatory Visit: Payer: Self-pay

## 2021-01-29 ENCOUNTER — Encounter: Payer: Self-pay | Admitting: Nurse Practitioner

## 2021-01-29 VITALS — BP 124/78 | HR 70 | Temp 98.3°F | Ht 66.5 in | Wt 174.4 lb

## 2021-01-29 DIAGNOSIS — D709 Neutropenia, unspecified: Secondary | ICD-10-CM

## 2021-01-29 DIAGNOSIS — E7800 Pure hypercholesterolemia, unspecified: Secondary | ICD-10-CM | POA: Diagnosis not present

## 2021-01-29 NOTE — Patient Instructions (Signed)
Neutropenia Neutropenia is a condition that occurs when you have a lower-than-normal level of a type of white blood cell (neutrophil) in your body. Neutrophils are made in the spongy center of large bones (bone marrow), and they fight infections. Neutrophils are your body's main defense against bacterial and fungal infections. The fewer neutrophils you have and the longer your body remains without them, the greater your risk of getting a severe infection. What are the causes? This condition can occur if your body uses up or destroys neutrophils faster than your bone marrow can make them. Neutropenia may be caused by:  A bacterial or fungal infection.  Allergic disorders.  Reactions to some medicines.  An autoimmune disease.  An enlarged spleen. This condition can also occur if your bone marrow does not produce enough neutrophils. This problem may be caused by:  Cancer.  Cancer treatments, such as radiation or chemotherapy.  Viral infections.  Medicines, such as phenytoin.  Vitamin B12 deficiency.  Diseases of the bone marrow.  Environmental toxins, such as insecticides. What are the signs or symptoms? This condition does not usually cause symptoms. If symptoms are present, they are usually caused by an underlying infection. Symptoms of an infection may include:  Fever.  Chills.  Swollen glands.  Oral or anal ulcers.  Cough and shortness of breath.  Rash.  Skin infection.  Fatigue. How is this diagnosed? Your health care provider may suspect neutropenia if you have:  A condition that may cause neutropenia.  Symptoms during or after treatment for cancer.  Symptoms of infection, especially fever.  Frequent and unusual infections. This condition is diagnosed based on your medical history and a physical exam. Tests will also be done, such as:  A complete blood count (CBC).  A procedure to collect a sample of bone marrow for examination (bone marrow  biopsy).  A chest X-ray.  A urine culture.  A blood culture. How is this treated? Treatment depends on the underlying cause and severity of your condition. Mild neutropenia may not require treatment. Treatment may include medicines, such as:  Antibiotic medicine given through an IV.  Antiviral medicines.  Antifungal medicines.  A medicine to increase neutrophil production (colony-stimulating factor). You may get this drug through an IV or by injection.  Steroids given through an IV. If an underlying condition is causing neutropenia, you may need treatment for that condition. If medicines or cancer treatments are causing neutropenia, your health care provider may have you stop the medicines or treatment. Follow these instructions at home: Medicines  Take over-the-counter and prescription medicines only as told by your health care provider.  Get a seasonal flu shot (influenza vaccine).  Avoid people who received a vaccine in the past 30 days if that vaccine contained a live version of the germ (live vaccine). You should not get a live vaccine. Common live vaccines are polio, MMR, chicken pox, and shingles vaccines.   Eating and drinking  Do not share food utensils.  Do not eat unpasteurized foods.  Do not eat raw or undercooked meat, eggs, or seafood.  Do not eat unwashed, raw fruits or vegetables. Lifestyle  Avoid exposure to groups of people or children.  Avoid being around people who are sick.  Avoid being around dirt or dust, such as in construction areas or gardens.  Do not provide direct care for pets. Avoid animal droppings. Do not clean litter boxes and bird cages.  Do not have sex unless your health care provider has approved. Hygiene    Bathe daily.  Clean the area between the genitals and the anus (perineal area) after you urinate or have a bowel movement. If you are male, wipe from front to back.  Brush your teeth with a soft toothbrush before and after  meals.  Do not use a regular razor. Use an electric razor to remove hair.  Wash your hands often. Make sure others who come in contact with you also wash their hands. If soap and water are not available, use hand sanitizer.   General instructions  Follow any precautions as told by your health care provider to reduce your risk for injury or infection.  Take actions to avoid cuts and burns. For example: ? Be cautious when you use knives. Always cut away from yourself. ? Keep knives in protective sheaths or guards when not in use. ? Use oven mitts when you cook with a hot stove, oven, or grill. ? Stand a safe distance away from open fires.  Do not use tampons, enemas, or rectal suppositories unless your health care provider has approved.  Keep all follow-up visits as told by your health care provider. This is important. Contact a health care provider if:  You have: ? A sore throat. ? A warm, red, or tender area on your skin. ? A cough. ? Frequent or painful urination. ? Vaginal discharge or itching.  You develop: ? Sores in your mouth or anus. ? Swollen lymph nodes. ? Red streaks on the skin. ? A rash. Get help right away if:  You have: ? A fever. ? Chills, or you start to shake.  You feel: ? Nauseous, or you vomit. ? Very fatigued. ? Short of breath. Summary  Neutropenia is a condition that occurs when you have a lower-than-normal level of a type of white blood cell (neutrophil) in your body.  This condition can occur if your body uses up or destroys neutrophils faster than your bone marrow can make them.  Treatment depends on the underlying cause and severity of your condition. Mild neutropenia may not require treatment.  Follow any precautions as told by your health care provider to reduce your risk for injury or infection. This information is not intended to replace advice given to you by your health care provider. Make sure you discuss any questions you have with  your health care provider. Document Revised: 05/28/2018 Document Reviewed: 05/28/2018 Elsevier Patient Education  2021 [employer]  

## 2021-01-29 NOTE — Assessment & Plan Note (Signed)
Chronic, ongoing.  Rosuvastatin started 4 weeks ago and he denies ADR with this.  Continue current medication regimen and adjust as needed.  Lipid panel today.  Return in 5 months.

## 2021-01-29 NOTE — Progress Notes (Signed)
BP 124/78 (BP Location: Left Arm)   Pulse 70   Temp 98.3 F (36.8 C) (Oral)   Ht 5' 6.5" (1.689 m)   Wt 174 lb 6.4 oz (79.1 kg)   SpO2 99%   BMI 27.73 kg/m    Subjective:    Patient ID: David Quinn, male    DOB: [birth year], 65 y.o.   MRN: [id number]  HPI: David Quinn is a 65 y.o. male  Chief Complaint  Patient presents with  . Hyperlipidemia    4 week f/up   ASTHMA Continues Advair BID and Proair.  Has history of low WBC ==  in May 2022 repeat labs -- 3.2 and neuts 1.2, but WBC post-op had been elevated December 2021, with recent H/H on 12/29/20, H/H 14.6/45.8 -- improved from post-op anemia.  No B symptoms.  Initially presented, in 2020 with WBC 2.5.  ESR on 06/07/20 = 39. Asthma status:stable Satisfied with current treatment?:yes Albuterol/rescue inhaler frequency:once a week recently due to pollen Dyspnea frequency:none Wheezing frequency:none Cough frequency:none Nocturnal symptom frequency:  Limitation of activity:no Current upper respiratory symptoms:no Triggers: weather changes Home peak flows: Last Spirometry: unknown Failed/intolerant to following asthma meds: unknown Asthma meds in past:  Aerochamber/spacer use:no Visits to ER or Urgent Care in past year:no Pneumovax:Not up to Date Influenza:refuses  HYPERLIPIDEMIA No current medications -- last LDL 177.  Started on Rosuvastatin 10 MG last visit and tolerating well.   Supplements: none Aspirin:  yes The 10-year ASCVD risk score Mikey Bussing DC Jr., et al., 2013) is: 15%   Values used to calculate the score:     Age: 21 years     Sex: Male     Is Non-Hispanic African American: Yes     Diabetic: No     Tobacco smoker: No     Systolic Blood Pressure: 829 mmHg     Is BP treated: Yes     HDL Cholesterol: 58 mg/dL     Total Cholesterol: 249 mg/dL Chest pain:  no Coronary artery disease:  no Family history CAD:  no Family history early CAD:  no  Relevant past medical, surgical,  family and social history reviewed and updated as indicated. Interim medical history since our last visit reviewed. Allergies and medications reviewed and updated.  Review of Systems  Constitutional: Negative for activity change, diaphoresis, fatigue and fever.  Respiratory: Negative for cough, chest tightness, shortness of breath and wheezing.   Cardiovascular: Negative for chest pain, palpitations and leg swelling.  Gastrointestinal: Negative.   Neurological: Negative.   Psychiatric/Behavioral: Negative.     Per HPI unless specifically indicated above     Objective:    BP 124/78 (BP Location: Left Arm)   Pulse 70   Temp 98.3 F (36.8 C) (Oral)   Ht 5' 6.5" (1.689 m)   Wt 174 lb 6.4 oz (79.1 kg)   SpO2 99%   BMI 27.73 kg/m   Wt Readings from Last 3 Encounters:  01/29/21 174 lb 6.4 oz (79.1 kg)  12/29/20 171 lb 3.2 oz (77.7 kg)  08/07/20 162 lb 6 oz (73.7 kg)    Physical Exam Vitals and nursing note reviewed.  Constitutional:      General: He is awake. He is not in acute distress.    Appearance: He is well-developed. He is not ill-appearing.  HENT:     Head: Normocephalic and atraumatic.     Right Ear: Hearing normal. No drainage.     Left Ear: Hearing normal. No drainage.  Eyes:     General: Lids are normal.        Right eye: No discharge.        Left eye: No discharge.     Conjunctiva/sclera: Conjunctivae normal.     Pupils: Pupils are equal, round, and reactive to light.  Neck:     Thyroid: No thyromegaly.     Vascular: No carotid bruit.  Cardiovascular:     Rate and Rhythm: Normal rate and regular rhythm.     Heart sounds: Normal heart sounds, S1 normal and S2 normal. No murmur heard. No gallop.   Pulmonary:     Effort: Pulmonary effort is normal. No accessory muscle usage or respiratory distress.     Breath sounds: Normal breath sounds.  Abdominal:     General: Bowel sounds are normal.     Palpations: Abdomen is soft.  Musculoskeletal:        General:  Normal range of motion.     Cervical back: Normal range of motion and neck supple.     Right lower leg: No edema.     Left lower leg: No edema.  Lymphadenopathy:     Head:     Right side of head: No submental, submandibular, tonsillar, preauricular or posterior auricular adenopathy.     Left side of head: No submental, submandibular, tonsillar, preauricular or posterior auricular adenopathy.     Cervical: No cervical adenopathy.  Skin:    General: Skin is warm and dry.  Neurological:     Mental Status: He is alert and oriented to person, place, and time.  Psychiatric:        Mood and Affect: Mood normal.        Behavior: Behavior normal. Behavior is cooperative.        Thought Content: Thought content normal.        Judgment: Judgment normal.    Results for orders placed or performed in visit on 12/29/20  CBC with Differential/Platelet  Result Value Ref Range   WBC 3.2 (L) 3.4 - 10.8 x10E3/uL   RBC 5.53 4.14 - 5.80 x10E6/uL   Hemoglobin 14.6 13.0 - 17.7 g/dL   Hematocrit 45.8 37.5 - 51.0 %   MCV 83 79 - 97 fL   MCH 26.4 (L) 26.6 - 33.0 pg   MCHC 31.9 31.5 - 35.7 g/dL   RDW 12.7 11.6 - 15.4 %   Platelets 249 150 - 450 x10E3/uL   Neutrophils 39 Not Estab. %   Lymphs 36 Not Estab. %   Monocytes 13 Not Estab. %   Eos 11 Not Estab. %   Basos 1 Not Estab. %   Neutrophils Absolute 1.2 (L) 1.4 - 7.0 x10E3/uL   Lymphocytes Absolute 1.1 0.7 - 3.1 x10E3/uL   Monocytes Absolute 0.4 0.1 - 0.9 x10E3/uL   EOS (ABSOLUTE) 0.4 0.0 - 0.4 x10E3/uL   Basophils Absolute 0.0 0.0 - 0.2 x10E3/uL   Immature Granulocytes 0 Not Estab. %   Immature Grans (Abs) 0.0 0.0 - 0.1 x10E3/uL  Comprehensive metabolic panel  Result Value Ref Range   Glucose 91 65 - 99 mg/dL   BUN 15 8 - 27 mg/dL   Creatinine, Ser 1.02 0.76 - 1.27 mg/dL   eGFR 82 >59 mL/min/1.73   BUN/Creatinine Ratio 15 10 - 24   Sodium 142 134 - 144 mmol/L   Potassium 4.4 3.5 - 5.2 mmol/L   Chloride 104 96 - 106 mmol/L   CO2 18 (L) 20  - 29 mmol/L     Calcium 9.7 8.6 - 10.2 mg/dL   Total Protein 7.6 6.0 - 8.5 g/dL   Albumin 4.3 3.8 - 4.8 g/dL   Globulin, Total 3.3 1.5 - 4.5 g/dL   Albumin/Globulin Ratio 1.3 1.2 - 2.2   Bilirubin Total 0.4 0.0 - 1.2 mg/dL   Alkaline Phosphatase 92 44 - 121 IU/L   AST 25 0 - 40 IU/L   ALT 20 0 - 44 IU/L  TSH  Result Value Ref Range   TSH 1.810 0.450 - 4.500 uIU/mL  PSA  Result Value Ref Range   Prostate Specific Ag, Serum 2.1 0.0 - 4.0 ng/mL  Lipid Panel w/o Chol/HDL Ratio  Result Value Ref Range   Cholesterol, Total 249 (H) 100 - 199 mg/dL   Triglycerides 84 0 - 149 mg/dL   HDL 58 >39 mg/dL   VLDL Cholesterol Cal 14 5 - 40 mg/dL   LDL Chol Calc (NIH) 177 (H) 0 - 99 mg/dL  Iron, TIBC and Ferritin Panel  Result Value Ref Range   Total Iron Binding Capacity 291 250 - 450 ug/dL   UIBC 226 111 - 343 ug/dL   Iron 65 38 - 169 ug/dL   Iron Saturation 22 15 - 55 %   Ferritin 164 30 - 400 ng/mL  Microalbumin, Urine Waived  Result Value Ref Range   Microalb, Ur Waived 80 (H) 0 - 19 mg/L   Creatinine, Urine Waived 200 10 - 300 mg/dL   Microalb/Creat Ratio 30-300 (H) <30 mg/g      Assessment & Plan:   Problem List Items Addressed This Visit      Other   Hyperlipidemia    Chronic, ongoing.  Rosuvastatin started 4 weeks ago and he denies ADR with this.  Continue current medication regimen and adjust as needed.  Lipid panel today.  Return in 5 months.      Relevant Orders   Lipid Panel w/o Chol/HDL Ratio   Neutropenia (HCC) - Primary    Noted on labs, initially in 2020 with WBC 2.5, recent was 3.3, had some increased levels pot surgery in November 2021.  Check CBC, ESR (recently elevated), CRP, ANA, iron today.  No B symptoms.  Consider hematology referral if ongoing -- discussed at length with him today.      Relevant Orders   CBC with Differential/Platelet   Iron, TIBC and Ferritin Panel   Sed Rate (ESR)   C-reactive protein   ANA w/Reflex if Positive       Follow up  plan: Return in about 5 months (around 07/01/2021) for HTN/HLD. 

## 2021-01-29 NOTE — Assessment & Plan Note (Signed)
Noted on labs, initially in 2020 with WBC 2.5, recent was 3.3, had some increased levels pot surgery in November 2021.  Check CBC, ESR (recently elevated), CRP, ANA, iron today.  No B symptoms.  Consider hematology referral if ongoing -- discussed at length with him today.

## 2021-01-30 LAB — CBC WITH DIFFERENTIAL/PLATELET
Basophils Absolute: 0 10*3/uL (ref 0.0–0.2)
Basos: 0 %
EOS (ABSOLUTE): 0.3 10*3/uL (ref 0.0–0.4)
Eos: 7 %
Hematocrit: 42.2 % (ref 37.5–51.0)
Hemoglobin: 13.6 g/dL (ref 13.0–17.7)
Immature Grans (Abs): 0 10*3/uL (ref 0.0–0.1)
Immature Granulocytes: 0 %
Lymphocytes Absolute: 1.2 10*3/uL (ref 0.7–3.1)
Lymphs: 26 %
MCH: 26.1 pg — ABNORMAL LOW (ref 26.6–33.0)
MCHC: 32.2 g/dL (ref 31.5–35.7)
MCV: 81 fL (ref 79–97)
Monocytes Absolute: 0.5 10*3/uL (ref 0.1–0.9)
Monocytes: 10 %
Neutrophils Absolute: 2.5 10*3/uL (ref 1.4–7.0)
Neutrophils: 57 %
Platelets: 232 10*3/uL (ref 150–450)
RBC: 5.21 x10E6/uL (ref 4.14–5.80)
RDW: 12.8 % (ref 11.6–15.4)
WBC: 4.5 10*3/uL (ref 3.4–10.8)

## 2021-01-30 LAB — LIPID PANEL W/O CHOL/HDL RATIO
Cholesterol, Total: 154 mg/dL (ref 100–199)
HDL: 60 mg/dL (ref >39)
LDL Chol Calc (NIH): 83 mg/dL (ref 0–99)
Triglycerides: 54 mg/dL (ref 0–149)
VLDL Cholesterol Cal: 11 mg/dL (ref 5–40)

## 2021-01-30 LAB — SEDIMENTATION RATE: Sed Rate: 10 mm/hr (ref 0–30)

## 2021-01-30 LAB — IRON,TIBC AND FERRITIN PANEL
Ferritin: 156 ng/mL (ref 30–400)
Iron Saturation: 22 % (ref 15–55)
Iron: 59 ug/dL (ref 38–169)
Total Iron Binding Capacity: 269 ug/dL (ref 250–450)
UIBC: 210 ug/dL (ref 111–343)

## 2021-01-30 LAB — C-REACTIVE PROTEIN: CRP: 11 mg/L — ABNORMAL HIGH (ref 0–10)

## 2021-01-30 LAB — ANA W/REFLEX IF POSITIVE: Anti Nuclear Antibody (ANA): NEGATIVE

## 2021-01-30 NOTE — Progress Notes (Signed)
Please let David Quinn know his labs have returned and CBC this check shows normal white blood cell count.  Great news!!  We do no need to pursue hematology at this time, but will monitor closely at visits.  Cholesterol levels much improved, continue your statin daily -- your LDL went from 177 to now 83 and total cholesterol from 249 to now 154.  Wonderful!!  Iron level normal and inflammatory labs stable.  ANA returned negative for any autoimmune disease, like rheumatoid or lupus.  Any questions?  Will see you back at next visit in 6 months. Keep being awesome!!  Thank you for allowing me to participate in your care.  I appreciate you. Kindest regards, Avri Paiva

## 2021-04-05 DIAGNOSIS — R29898 Other symptoms and signs involving the musculoskeletal system: Secondary | ICD-10-CM | POA: Diagnosis not present

## 2021-05-27 ENCOUNTER — Other Ambulatory Visit: Payer: Self-pay | Admitting: Nurse Practitioner

## 2021-06-29 ENCOUNTER — Ambulatory Visit: Payer: Self-pay | Admitting: Nurse Practitioner

## 2021-07-04 ENCOUNTER — Ambulatory Visit: Payer: BC Managed Care – PPO | Admitting: Nurse Practitioner

## 2021-10-29 ENCOUNTER — Other Ambulatory Visit: Payer: Self-pay | Admitting: Nurse Practitioner

## 2021-10-30 NOTE — Telephone Encounter (Signed)
Requested medication (s) are due for refill today: yes ? ?Requested medication (s) are on the active medication list: yes ? ?Last refill:  05/27/21 #60/4 ? ?Future visit scheduled:no ? ?Notes to clinic:  Unable to refill per protocol, medication not assigned to the refill protocol. ? ? ? ?  ?Requested Prescriptions  ?Pending Prescriptions Disp Refills  ? fluticasone furoate-vilanterol (BREO ELLIPTA) 100-25 MCG/ACT AEPB [Pharmacy Med Name: FLUTIC/VILAN 100-25MCG ORAL INH(30)] 60 each 4  ?  Sig: INHALE 1 PUFF INTO THE LUNGS DAILY  ?  ? There is no refill protocol information for this order  ?  ? ?

## 2021-10-30 NOTE — Telephone Encounter (Signed)
Lmom asking pt to call back to schedule fu appt. °

## 2021-10-31 ENCOUNTER — Encounter: Payer: Self-pay | Admitting: Nurse Practitioner

## 2021-10-31 NOTE — Telephone Encounter (Signed)
2nd attempt. Unable to leave vm.

## 2021-11-01 ENCOUNTER — Encounter: Payer: Self-pay | Admitting: Nurse Practitioner

## 2021-11-01 NOTE — Telephone Encounter (Signed)
3rd attempt- Unable to leave vm. Sending letter ?

## 2021-12-29 ENCOUNTER — Other Ambulatory Visit: Payer: Self-pay | Admitting: Nurse Practitioner

## 2021-12-31 NOTE — Telephone Encounter (Signed)
Refilled 12/29/2020 #90 4 refills - should last until 03/2022. ?Requested Prescriptions  ?Pending Prescriptions Disp Refills  ?? lisinopril (ZESTRIL) 10 MG tablet [Pharmacy Med Name: LISINOPRIL 10MG  TABLETS] 90 tablet 4  ?  Sig: TAKE 1 TABLET(10 MG) BY MOUTH DAILY  ?  ? Cardiovascular:  ACE Inhibitors Failed - 12/29/2021  3:14 AM  ?  ?  Failed - Cr in normal range and within 180 days  ?  Creatinine, Ser  ?Date Value Ref Range Status  ?12/29/2020 1.02 0.76 - 1.27 mg/dL Final  ?   ?  ?  Failed - K in normal range and within 180 days  ?  Potassium  ?Date Value Ref Range Status  ?12/29/2020 4.4 3.5 - 5.2 mmol/L Final  ?   ?  ?  Failed - Valid encounter within last 6 months  ?  Recent Outpatient Visits   ?      ? 11 months ago Neutropenia, unspecified type (HCC)  ? [facility] [place], [place] T, NP  ? 1 year ago Neutropenia, unspecified type (HCC)  ? [facility] [place], [place], NP  ? 1 year ago   ? [facility] [place] [facility], NP  ? 1 year ago Neutropenia, unspecified type [facility])  ? [facility] [place], [place] T, NP  ? 2 years ago Annual physical exam  ? [facility] [place], [place], NP  ?  ?  ? ?  ?  ?  Passed - Patient is not pregnant  ?  ?  Passed - Last BP in normal range  ?  BP Readings from Last 1 Encounters:  ?01/29/21 124/78  ?   ?  ?  ? ?

## 2022-01-26 ENCOUNTER — Other Ambulatory Visit: Payer: Self-pay | Admitting: Nurse Practitioner

## 2022-01-28 NOTE — Telephone Encounter (Signed)
Requested medication (s) are due for refill today: yes  Requested medication (s) are on the active medication list: yes  Last refill:  08/16/21  Future visit scheduled: no  Notes to clinic:  Unable to refill per protocol, appointment needed. Called pt, no answer. Left VM to call back to schedule OV for medication refills.      Requested Prescriptions  Pending Prescriptions Disp Refills   rosuvastatin (CRESTOR) 10 MG tablet [Pharmacy Med Name: ROSUVASTATIN 10MG  TABLETS] 90 tablet 4    Sig: TAKE 1 TABLET(10 MG) BY MOUTH DAILY     Cardiovascular:  Antilipid - Statins 2 Failed - 01/26/2022  3:17 AM      Failed - Cr in normal range and within 360 days    Creatinine, Ser  Date Value Ref Range Status  12/29/2020 1.02 0.76 - 1.27 mg/dL Final         Failed - Valid encounter within last 12 months    Recent Outpatient Visits           12 months ago Neutropenia, unspecified type (HCC)   [facility] [place], [place] T, NP   1 year ago Neutropenia, unspecified type (HCC)   [facility] [place], [place], NP   1 year ago    [facility], NP   1 year ago Neutropenia, unspecified type (HCC)   [facility] [place], [place] T, NP   2 years ago Annual physical exam   [facility] [place], [place] T, NP               Failed - Lipid Panel in normal range within the last 12 months    Cholesterol, Total  Date Value Ref Range Status  01/29/2021 154 100 - 199 mg/dL Final   LDL Chol Calc (NIH)  Date Value Ref Range Status  01/29/2021 83 0 - 99 mg/dL Final   HDL  Date Value Ref Range Status  01/29/2021 60 >39 mg/dL Final   Triglycerides  Date Value Ref Range Status  01/29/2021 54 0 - 149 mg/dL Final         Passed - Patient is not pregnant

## 2022-01-28 NOTE — Telephone Encounter (Signed)
Please call patient and schedule for an appointment for refill.

## 2022-01-29 NOTE — Telephone Encounter (Signed)
LVM asking patient to call back to schedule an appt 

## 2022-01-30 ENCOUNTER — Encounter: Payer: Self-pay | Admitting: Nurse Practitioner

## 2022-01-30 NOTE — Telephone Encounter (Signed)
I have been unable to reach this patient by phone.  A letter is being sent to the last known home address.

## 2022-02-08 ENCOUNTER — Encounter: Payer: Self-pay | Admitting: Nurse Practitioner

## 2022-02-08 ENCOUNTER — Ambulatory Visit (INDEPENDENT_AMBULATORY_CARE_PROVIDER_SITE_OTHER): Payer: Medicare Other | Admitting: Nurse Practitioner

## 2022-02-08 VITALS — BP 142/84 | HR 82 | Temp 98.0°F | Wt 165.6 lb

## 2022-02-08 DIAGNOSIS — R809 Proteinuria, unspecified: Secondary | ICD-10-CM

## 2022-02-08 DIAGNOSIS — J4540 Moderate persistent asthma, uncomplicated: Secondary | ICD-10-CM

## 2022-02-08 DIAGNOSIS — E7800 Pure hypercholesterolemia, unspecified: Secondary | ICD-10-CM | POA: Diagnosis not present

## 2022-02-08 DIAGNOSIS — N400 Enlarged prostate without lower urinary tract symptoms: Secondary | ICD-10-CM

## 2022-02-08 DIAGNOSIS — I10 Essential (primary) hypertension: Secondary | ICD-10-CM | POA: Diagnosis not present

## 2022-02-08 DIAGNOSIS — D709 Neutropenia, unspecified: Secondary | ICD-10-CM | POA: Diagnosis not present

## 2022-02-08 DIAGNOSIS — Z1211 Encounter for screening for malignant neoplasm of colon: Secondary | ICD-10-CM

## 2022-02-08 LAB — MICROALBUMIN, URINE WAIVED
Creatinine, Urine Waived: 200 mg/dL (ref 10–300)
Microalb, Ur Waived: 150 mg/L — ABNORMAL HIGH (ref 0–19)

## 2022-02-08 MED ORDER — ROSUVASTATIN CALCIUM 10 MG PO TABS: 10.0000 mg | ORAL_TABLET | Freq: Every day | ORAL | 4 refills | Status: DC

## 2022-02-08 MED ORDER — LISINOPRIL 10 MG PO TABS: ORAL_TABLET | ORAL | 4 refills | Status: DC

## 2022-02-08 MED ORDER — FLUTICASONE FUROATE-VILANTEROL 100-25 MCG/ACT IN AEPB: 1.0000 | INHALATION_SPRAY | Freq: Every day | RESPIRATORY_TRACT | 4 refills | Status: DC

## 2022-02-08 MED ORDER — ALBUTEROL SULFATE HFA 108 (90 BASE) MCG/ACT IN AERS: 2.0000 | INHALATION_SPRAY | Freq: Four times a day (QID) | RESPIRATORY_TRACT | 3 refills | Status: DC | PRN

## 2022-02-08 NOTE — Progress Notes (Signed)
BP (!) 142/84 (BP Location: Left Arm, Patient Position: Sitting)   Pulse 82   Temp 98 F (36.7 C) (Oral)   Wt 165 lb 9.6 oz (75.1 kg)   SpO2 97%   BMI 26.33 kg/m    Subjective:    Patient ID: David Quinn, male    DOB: 05-10-1956, 66 y.o.   MRN: [id number]  HPI: David Quinn is a 66 y.o. male  Chief Complaint  Patient presents with   Hyperlipidemia   Hypertension   Medication Refill   HYPERTENSION / HYPERLIPIDEMIA Prescribed Lisinopril and Rosuvastatin, but has been out of these for over a week. Satisfied with current treatment? yes Duration of hypertension: chronic BP monitoring frequency: not checking BP range:  BP medication side effects: no Duration of hyperlipidemia: chronic Aspirin: no Recent stressors: no Recurrent headaches: no Visual changes: no Palpitations: no Dyspnea: no Chest pain: no Lower extremity edema: no Dizzy/lightheaded: no  The 10-year ASCVD risk score (Arnett DK, et al., 2019) is: 17.1%   Values used to calculate the score:     Age: 67 years     Sex: Male     Is Non-Hispanic African American: Yes     Diabetic: No     Tobacco smoker: No     Systolic Blood Pressure: 142 mmHg     Is BP treated: Yes     HDL Cholesterol: 60 mg/dL     Total Cholesterol: 154 mg/dL   ASTHMA Prescribed Breo and Albuterol -- has been out of inhalers for months.  Has history of low WBC ==  last 01/29/21 = 4.5.  No B symptoms.  Initially presented, in 2020 with WBC 2.5.   Asthma status: stable Satisfied with current treatment?: yes Albuterol/rescue inhaler frequency: once a week recently due to pollen Dyspnea frequency: none Wheezing frequency: none Cough frequency:  none Nocturnal symptom frequency:  Limitation of activity: no Current upper respiratory symptoms: no Triggers: weather changes Home peak flows: Last Spirometry: unknown Failed/intolerant to following asthma meds: unknown Asthma meds in past:  Aerochamber/spacer use: no Visits to  ER or Urgent Care in past year: no Pneumovax: Not up to Date Influenza: refuses      02/08/2022    4:10 PM 12/29/2020   10:13 AM 11/19/2019    3:13 PM 10/13/2018    2:57 PM 10/08/2017    3:27 PM  Depression screen PHQ 2/9  Decreased Interest 0 0 0 0 0  Down, Depressed, Hopeless 0 0 0 0 0  PHQ - 2 Score 0 0 0 0 0  Altered sleeping 0    0  Tired, decreased energy 0    0  Change in appetite 0    0  Feeling bad or failure about yourself  0    0  Trouble concentrating 0    0  Moving slowly or fidgety/restless 0    0  Suicidal thoughts 0    0  PHQ-9 Score 0    0  Difficult doing work/chores Not difficult at all          Relevant past medical, surgical, family and social history reviewed and updated as indicated. Interim medical history since our last visit reviewed. Allergies and medications reviewed and updated.  Review of Systems  Constitutional:  Negative for activity change, diaphoresis, fatigue and fever.  Respiratory:  Negative for cough, chest tightness, shortness of breath and wheezing.   Cardiovascular:  Negative for chest pain, palpitations and leg swelling.  Gastrointestinal: Negative.  Neurological: Negative.   Psychiatric/Behavioral: Negative.      Per HPI unless specifically indicated above     Objective:    BP (!) 142/84 (BP Location: Left Arm, Patient Position: Sitting)   Pulse 82   Temp 98 F (36.7 C) (Oral)   Wt 165 lb 9.6 oz (75.1 kg)   SpO2 97%   BMI 26.33 kg/m   Wt Readings from Last 3 Encounters:  02/08/22 165 lb 9.6 oz (75.1 kg)  01/29/21 174 lb 6.4 oz (79.1 kg)  12/29/20 171 lb 3.2 oz (77.7 kg)    Physical Exam Vitals and nursing note reviewed.  Constitutional:      General: He is awake. He is not in acute distress.    Appearance: He is well-developed and well-groomed. He is not ill-appearing or toxic-appearing.  HENT:     Head: Normocephalic and atraumatic.     Right Ear: Hearing normal. No drainage.     Left Ear: Hearing normal. No  drainage.  Eyes:     General: Lids are normal.        Right eye: No discharge.        Left eye: No discharge.     Conjunctiva/sclera: Conjunctivae normal.     Pupils: Pupils are equal, round, and reactive to light.  Neck:     Thyroid: No thyromegaly.     Vascular: No carotid bruit.  Cardiovascular:     Rate and Rhythm: Normal rate and regular rhythm.     Heart sounds: Normal heart sounds, S1 normal and S2 normal. No murmur heard.    No gallop.  Pulmonary:     Effort: Pulmonary effort is normal. No accessory muscle usage or respiratory distress.     Breath sounds: Normal breath sounds.  Abdominal:     General: Bowel sounds are normal.     Palpations: Abdomen is soft.  Musculoskeletal:        General: Normal range of motion.     Cervical back: Normal range of motion and neck supple.     Right lower leg: No edema.     Left lower leg: No edema.  Lymphadenopathy:     Head:     Right side of head: No submental, submandibular, tonsillar, preauricular or posterior auricular adenopathy.     Left side of head: No submental, submandibular, tonsillar, preauricular or posterior auricular adenopathy.     Cervical: No cervical adenopathy.  Skin:    General: Skin is warm and dry.  Neurological:     Mental Status: He is alert and oriented to person, place, and time.  Psychiatric:        Mood and Affect: Mood normal.        Behavior: Behavior normal. Behavior is cooperative.        Thought Content: Thought content normal.        Judgment: Judgment normal.    Results for orders placed or performed in visit on 02/08/22  Microalbumin, Urine Waived  Result Value Ref Range   Microalb, Ur Waived 150 (H) 0 - 19 mg/L   Creatinine, Urine Waived 200 10 - 300 mg/dL   Microalb/Creat Ratio 30-300 (H) <30 mg/g      Assessment & Plan:   Problem List Items Addressed This Visit       Cardiovascular and Mediastinum   Hypertension    Chronic, ongoing with elevations today due to being out of  medication.  Restart Lisinopril 10 MG daily, script sent.  Recommend he monitor BP  at least a few mornings a week at home and document.  DASH diet at home.  Continue current medication regimen and adjust as needed.  Labs today: CBC, CMP, TSH, urine ALB.  Return in 3 months.       Relevant Medications   rosuvastatin (CRESTOR) 10 MG tablet   lisinopril (ZESTRIL) 10 MG tablet   Other Relevant Orders   Comprehensive metabolic panel   CBC with Differential/Platelet   TSH     Respiratory   Asthma, moderate persistent    Chronic, ongoing. Continue Albuterol as needed and Breo daily, may need to adjust depending on coverage.  Adjust regimen as needed based on symptoms.  Spirometry next visit.  CBC today.  Return in 3 months.      Relevant Medications   fluticasone furoate-vilanterol (BREO ELLIPTA) 100-25 MCG/ACT AEPB   albuterol (PROAIR HFA) 108 (90 Base) MCG/ACT inhaler   Other Relevant Orders   CBC with Differential/Platelet     Other   Hyperlipidemia    Chronic, ongoing.  Continue current medication regimen and adjust as needed. Lipid panel today.  Return in 3 months.      Relevant Medications   rosuvastatin (CRESTOR) 10 MG tablet   lisinopril (ZESTRIL) 10 MG tablet   Other Relevant Orders   Comprehensive metabolic panel   Lipid Panel w/o Chol/HDL Ratio   Neutropenia (HCC) - Primary    Noted on labs, initially in 2020 with WBC 2.5, recent was 4.5.  Check CBC today.  No B symptoms.  Consider hematology referral if ongoing -- discussed at length with him today.      Relevant Orders   CBC with Differential/Platelet   Proteinuria    Noted on past checks, today is 150.  Restart Lisinopril and plan for recheck in 3-6 months, if ongoing proteinuria consider kidney ultrasound.      Relevant Orders   Microalbumin, Urine Waived (Completed)   Other Visit Diagnoses     Benign prostatic hyperplasia without lower urinary tract symptoms       PSA on labs today.   Relevant Orders   PSA    Colon cancer screening       GI referral placed.   Relevant Orders   Ambulatory referral to Gastroenterology        Follow up plan: Return in about 3 months (around 05/11/2022) for HTN/HLD, ASTHMA, NEUTROPENIA.

## 2022-02-08 NOTE — Assessment & Plan Note (Signed)
Noted on labs, initially in 2020 with WBC 2.5, recent was 4.5.  Check CBC today.  No B symptoms.  Consider hematology referral if ongoing -- discussed at length with him today.

## 2022-02-08 NOTE — Assessment & Plan Note (Signed)
Chronic, ongoing.  Continue current medication regimen and adjust as needed.  Lipid panel today.  Return in 3 months. 

## 2022-02-08 NOTE — Assessment & Plan Note (Signed)
Noted on past checks, today is 150.  Restart Lisinopril and plan for recheck in 3-6 months, if ongoing proteinuria consider kidney ultrasound.

## 2022-02-08 NOTE — Assessment & Plan Note (Signed)
Chronic, ongoing. Continue Albuterol as needed and Breo daily, may need to adjust depending on coverage.  Adjust regimen as needed based on symptoms.  Spirometry next visit.  CBC today.  Return in 3 months.

## 2022-02-08 NOTE — Patient Instructions (Signed)
Take Vitamin D3 2000 units daily.  DASH Eating Plan DASH stands for Dietary Approaches to Stop Hypertension. The DASH eating plan is a healthy eating plan that has been shown to: Reduce high blood pressure (hypertension). Reduce your risk for type 2 diabetes, heart disease, and stroke. Help with weight loss. What are tips for following this plan? Reading food labels Check food labels for the amount of salt (sodium) per serving. Choose foods with less than 5 percent of the Daily Value of sodium. Generally, foods with less than 300 milligrams (mg) of sodium per serving fit into this eating plan. To find whole grains, look for the word "whole" as the first word in the ingredient list. Shopping Buy products labeled as "low-sodium" or "no salt added." Buy fresh foods. Avoid canned foods and pre-made or frozen meals. Cooking Avoid adding salt when cooking. Use salt-free seasonings or herbs instead of table salt or sea salt. Check with your health care provider or pharmacist before using salt substitutes. Do not fry foods. Cook foods using healthy methods such as baking, boiling, grilling, roasting, and broiling instead. Cook with heart-healthy oils, such as olive, canola, avocado, soybean, or sunflower oil. Meal planning  Eat a balanced diet that includes: 4 or more servings of fruits and 4 or more servings of vegetables each day. Try to fill one-half of your plate with fruits and vegetables. 6-8 servings of whole grains each day. Less than 6 oz (170 g) of lean meat, poultry, or fish each day. A 3-oz (85-g) serving of meat is about the same size as a deck of cards. One egg equals 1 oz (28 g). 2-3 servings of low-fat dairy each day. One serving is 1 cup (237 mL). 1 serving of nuts, seeds, or beans 5 times each week. 2-3 servings of heart-healthy fats. Healthy fats called omega-3 fatty acids are found in foods such as walnuts, flaxseeds, fortified milks, and eggs. These fats are also found in  cold-water fish, such as sardines, salmon, and mackerel. Limit how much you eat of: Canned or prepackaged foods. Food that is high in trans fat, such as some fried foods. Food that is high in saturated fat, such as fatty meat. Desserts and other sweets, sugary drinks, and other foods with added sugar. Full-fat dairy products. Do not salt foods before eating. Do not eat more than 4 egg yolks a week. Try to eat at least 2 vegetarian meals a week. Eat more home-cooked food and less restaurant, buffet, and fast food. Lifestyle When eating at a restaurant, ask that your food be prepared with less salt or no salt, if possible. If you drink alcohol: Limit how much you use to: 0-1 drink a day for women who are not pregnant. 0-2 drinks a day for men. Be aware of how much alcohol is in your drink. In the [place] one drink equals one 12 oz bottle of beer (355 mL), one 5 oz glass of wine (148 mL), or one 1 oz glass of hard liquor (44 mL). General information Avoid eating more than 2,300 mg of salt a day. If you have hypertension, you may need to reduce your sodium intake to 1,500 mg a day. Work with your health care provider to maintain a healthy body weight or to lose weight. Ask what an ideal weight is for you. Get at least 30 minutes of exercise that causes your heart to beat faster (aerobic exercise) most days of the week. Activities may include walking, swimming, or biking. Work with  your health care provider or dietitian to adjust your eating plan to your individual calorie needs. What foods should I eat? Fruits All fresh, dried, or frozen fruit. Canned fruit in natural juice (without added sugar). Vegetables Fresh or frozen vegetables (raw, steamed, roasted, or grilled). Low-sodium or reduced-sodium tomato and vegetable juice. Low-sodium or reduced-sodium tomato sauce and tomato paste. Low-sodium or reduced-sodium canned vegetables. Grains Whole-grain or whole-wheat bread. Whole-grain or  whole-wheat pasta. Brown rice. [place]. Bulgur. Whole-grain and low-sodium cereals. Pita bread. Low-fat, low-sodium crackers. Whole-wheat flour tortillas. Meats and other proteins Skinless chicken or [place]. Ground chicken or [place]. Pork with fat trimmed off. Fish and seafood. Egg whites. Dried beans, peas, or lentils. Unsalted nuts, nut butters, and seeds. Unsalted canned beans. Lean cuts of beef with fat trimmed off. Low-sodium, lean precooked or cured meat, such as sausages or meat loaves. Dairy Low-fat (1%) or fat-free (skim) milk. Reduced-fat, low-fat, or fat-free cheeses. Nonfat, low-sodium ricotta or cottage cheese. Low-fat or nonfat yogurt. Low-fat, low-sodium cheese. Fats and oils Soft margarine without trans fats. Vegetable oil. Reduced-fat, low-fat, or light mayonnaise and salad dressings (reduced-sodium). Canola, safflower, olive, avocado, soybean, and sunflower oils. Avocado. Seasonings and condiments Herbs. Spices. Seasoning mixes without salt. Other foods Unsalted popcorn and pretzels. Fat-free sweets. The items listed above may not be a complete list of foods and beverages you can eat. Contact a dietitian for more information. What foods should I avoid? Fruits Canned fruit in a light or heavy syrup. Fried fruit. Fruit in cream or butter sauce. Vegetables Creamed or fried vegetables. Vegetables in a cheese sauce. Regular canned vegetables (not low-sodium or reduced-sodium). Regular canned tomato sauce and paste (not low-sodium or reduced-sodium). Regular tomato and vegetable juice (not low-sodium or reduced-sodium). Angie Fava. Olives. Grains Baked goods made with fat, such as croissants, muffins, or some breads. Dry pasta or rice meal packs. Meats and other proteins Fatty cuts of meat. Ribs. Fried meat. Berniece Salines. Bologna, salami, and other precooked or cured meats, such as sausages or meat loaves. Fat from the back of a pig (fatback). Bratwurst. Salted nuts and seeds. Canned  beans with added salt. Canned or smoked fish. Whole eggs or egg yolks. Chicken or [place] with skin. Dairy Whole or 2% milk, cream, and half-and-half. Whole or full-fat cream cheese. Whole-fat or sweetened yogurt. Full-fat cheese. Nondairy creamers. Whipped toppings. Processed cheese and cheese spreads. Fats and oils Butter. Stick margarine. Lard. Shortening. Ghee. Bacon fat. Tropical oils, such as coconut, palm kernel, or palm oil. Seasonings and condiments Onion salt, garlic salt, seasoned salt, table salt, and sea salt. Worcestershire sauce. Tartar sauce. Barbecue sauce. Teriyaki sauce. Soy sauce, including reduced-sodium. Steak sauce. Canned and packaged gravies. Fish sauce. Oyster sauce. Cocktail sauce. Store-bought horseradish. Ketchup. Mustard. Meat flavorings and tenderizers. Bouillon cubes. Hot sauces. Pre-made or packaged marinades. Pre-made or packaged taco seasonings. Relishes. Regular salad dressings. Other foods Salted popcorn and pretzels. The items listed above may not be a complete list of foods and beverages you should avoid. Contact a dietitian for more information. Where to find more information National Heart, Lung, and Blood Institute: https://wilson-eaton.com/ American Heart Association: www.heart.org Academy of Nutrition and Dietetics: www.eatright.[place]: www.kidney.org Summary The DASH eating plan is a healthy eating plan that has been shown to reduce high blood pressure (hypertension). It may also reduce your risk for type 2 diabetes, heart disease, and stroke. When on the DASH eating plan, aim to eat more fresh fruits and vegetables, whole grains, lean proteins, low-fat  dairy, and heart-healthy fats. With the DASH eating plan, you should limit salt (sodium) intake to 2,300 mg a day. If you have hypertension, you may need to reduce your sodium intake to 1,500 mg a day. Work with your health care provider or dietitian to adjust your eating plan to your  individual calorie needs. This information is not intended to replace advice given to you by your health care provider. Make sure you discuss any questions you have with your health care provider. Document Revised: 07/16/2019 Document Reviewed: 07/16/2019 Elsevier Patient Education  [place].

## 2022-02-08 NOTE — Assessment & Plan Note (Signed)
Chronic, ongoing with elevations today due to being out of medication.  Restart Lisinopril 10 MG daily, script sent.  Recommend he monitor BP at least a few mornings a week at home and document.  DASH diet at home.  Continue current medication regimen and adjust as needed.  Labs today: CBC, CMP, TSH, urine ALB.  Return in 3 months.

## 2022-02-09 LAB — COMPREHENSIVE METABOLIC PANEL
ALT: 36 IU/L (ref 0–44)
AST: 34 IU/L (ref 0–40)
Albumin/Globulin Ratio: 1.5 (ref 1.2–2.2)
Albumin: 4.4 g/dL (ref 3.8–4.8)
Alkaline Phosphatase: 95 IU/L (ref 44–121)
BUN/Creatinine Ratio: 13 (ref 10–24)
BUN: 13 mg/dL (ref 8–27)
Bilirubin Total: 0.3 mg/dL (ref 0.0–1.2)
CO2: 25 mmol/L (ref 20–29)
Calcium: 9.6 mg/dL (ref 8.6–10.2)
Chloride: 103 mmol/L (ref 96–106)
Creatinine, Ser: 0.97 mg/dL (ref 0.76–1.27)
Globulin, Total: 2.9 g/dL (ref 1.5–4.5)
Glucose: 84 mg/dL (ref 70–99)
Potassium: 4.7 mmol/L (ref 3.5–5.2)
Sodium: 141 mmol/L (ref 134–144)
Total Protein: 7.3 g/dL (ref 6.0–8.5)
eGFR: 87 mL/min/{1.73_m2} (ref >59)

## 2022-02-09 LAB — CBC WITH DIFFERENTIAL/PLATELET
Basophils Absolute: 0 10*3/uL (ref 0.0–0.2)
Basos: 1 %
EOS (ABSOLUTE): 0.4 10*3/uL (ref 0.0–0.4)
Eos: 9 %
Hematocrit: 47.1 % (ref 37.5–51.0)
Hemoglobin: 14.9 g/dL (ref 13.0–17.7)
Immature Grans (Abs): 0 10*3/uL (ref 0.0–0.1)
Immature Granulocytes: 0 %
Lymphocytes Absolute: 1.3 10*3/uL (ref 0.7–3.1)
Lymphs: 35 %
MCH: 26.2 pg — ABNORMAL LOW (ref 26.6–33.0)
MCHC: 31.6 g/dL (ref 31.5–35.7)
MCV: 83 fL (ref 79–97)
Monocytes Absolute: 0.6 10*3/uL (ref 0.1–0.9)
Monocytes: 15 %
Neutrophils Absolute: 1.5 10*3/uL (ref 1.4–7.0)
Neutrophils: 40 %
Platelets: 266 10*3/uL (ref 150–450)
RBC: 5.69 x10E6/uL (ref 4.14–5.80)
RDW: 12.9 % (ref 11.6–15.4)
WBC: 3.8 10*3/uL (ref 3.4–10.8)

## 2022-02-09 LAB — PSA: Prostate Specific Ag, Serum: 2.4 ng/mL (ref 0.0–4.0)

## 2022-02-09 LAB — LIPID PANEL W/O CHOL/HDL RATIO
Cholesterol, Total: 154 mg/dL (ref 100–199)
HDL: 67 mg/dL (ref >39)
LDL Chol Calc (NIH): 71 mg/dL (ref 0–99)
Triglycerides: 83 mg/dL (ref 0–149)
VLDL Cholesterol Cal: 16 mg/dL (ref 5–40)

## 2022-02-09 LAB — TSH: TSH: 1.7 u[IU]/mL (ref 0.450–4.500)

## 2022-02-09 NOTE — Progress Notes (Signed)
Good morning crew, please let Keyton know his labs have returned.  Everything is stable.  Continue all current medications and ensure to take them daily.  Any questions? Keep being stellar!!  Thank you for allowing me to participate in your care.  I appreciate you. Kindest regards, Janne Faulk

## 2022-02-11 ENCOUNTER — Telehealth: Payer: Self-pay

## 2022-02-11 NOTE — Telephone Encounter (Signed)
CALLED PATIENT NO ANSWER LEFT VOICEMAIL FOR A CALL BACK ? ?

## 2022-02-12 ENCOUNTER — Telehealth: Payer: Self-pay

## 2022-02-12 NOTE — Telephone Encounter (Signed)
Spoke with patient to notify him of Jolene's recommendations and step by step process of receiving the coupon to help with co-pay for prescription. Patient was advised to give our office a call back if he has any questions or concerns. Patient verbalized understanding.

## 2022-02-12 NOTE — Telephone Encounter (Signed)
Pt returned our call.  Shared provider's note with pt.  Marjie Skiff, NP  02/09/2022  9:55 AM EDT     Good morning crew, please let Flem know his labs have returned.  Everything is stable.  Continue all current medications and ensure to take them daily.  Any questions? Keep being stellar!!  Thank you for allowing me to participate in your care.  I appreciate you. Kindest regards, Jolene     Pt has a concern with medications. Pt went to pick up medications and the cost was $400. PT cannot afford this. Pt is working on getting his Medicare prescription plan, but it is not yet active. PT is asking if there are any samples available, or if there are any coupons available. Most pricey medication is the [employer].  Please return pt's call for help with medications.

## 2022-02-13 ENCOUNTER — Telehealth: Payer: Self-pay

## 2022-02-13 NOTE — Telephone Encounter (Signed)
CALLED PATIENT NO ANSWER LEFT VOICEMAIL FOR A CALL BACK °Letter sent °

## 2022-02-14 ENCOUNTER — Telehealth: Payer: Self-pay

## 2022-02-14 NOTE — Telephone Encounter (Unsigned)
Copied from CRM [id number]. Topic: General - Call Back - No Documentation >> Feb 14, 2022  2:47 PM Franchot Heidelberg wrote: Reason for CRM: Pt called requesting assistance with [facility],  Best contact: [phone]

## 2022-02-15 NOTE — Telephone Encounter (Signed)
Spoke with patient and informed him that he can go down to his local pharmacy at [facility] and have someone in the pharmacy to help him go to the website and type in the medications that is in his current medications list and get his medication cheaper until his medication coverage starts. Patient verbalized understanding and advised to give our office a call back if he has any questions.

## 2022-05-02 IMAGING — MR MR LUMBAR SPINE W/O CM
4 of 5 series · 29 of 48 positions shown · non-contrast
Comparison: Lumbar spine MRI 01/04/2011

CLINICAL DATA: Low back pain, progressive neurologic deficit.
Additional history provided: Patient reports hip pain, progressively
worsening and radiating into left knee.

EXAM:
MRI LUMBAR SPINE WITHOUT CONTRAST
TECHNIQUE: Multiplanar, multisequence MR imaging of the lumbar spine was
performed. No intravenous contrast was administered.

[Series 5: T2 · sagittal · 4.0mm · 0.81mm/px · 6 of 15 slices shown (1 of 2)]
[im 1/15]
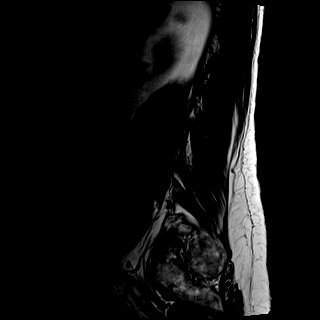
[im 3/15]
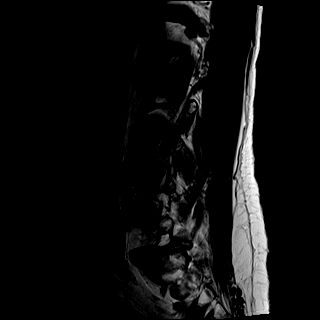
[im 6/15]
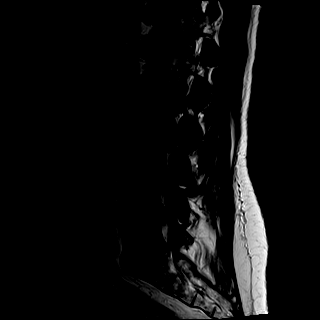
[im 9/15]
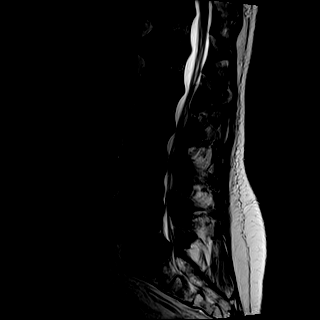
[im 12/15]
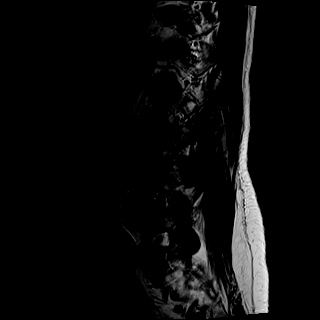
[im 15/15]
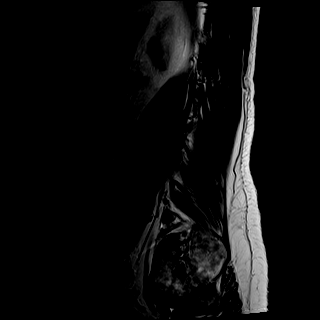

[Series 6: T1 · sagittal · 4.0mm · 0.81mm/px · 7 of 15 slices shown (1 of 2)]
[im 1/15]
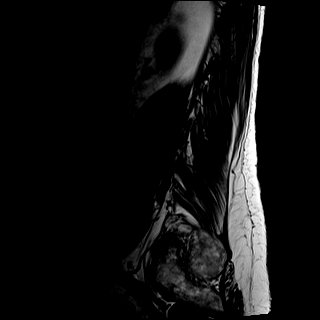
[im 3/15]
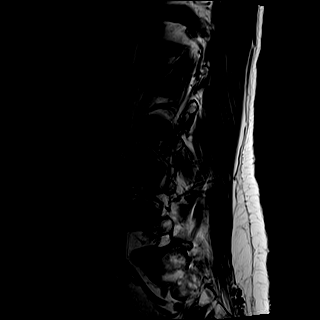
[im 5/15]
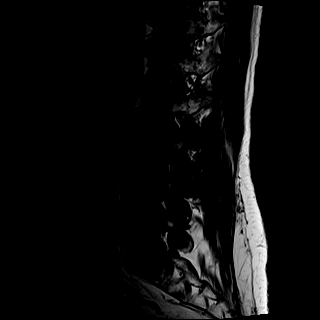
[im 8/15]
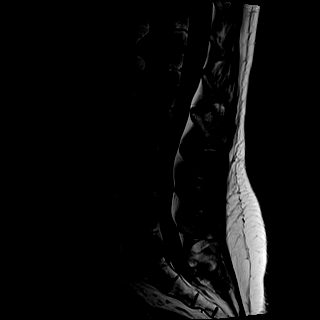
[im 10/15]
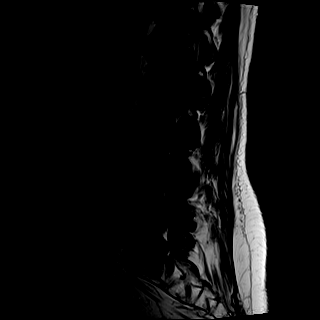
[im 12/15]
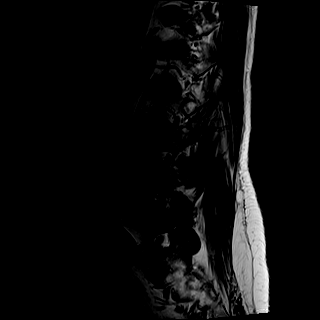
[im 15/15]
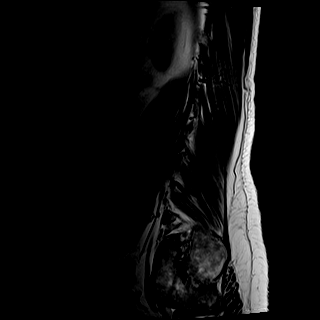

[Series 8: T2 · axial · 4.0mm · 0.78mm/px · z∈[-112,+95]mm · 8 of 31 slices shown (2 of 2)]
[im 1/31]
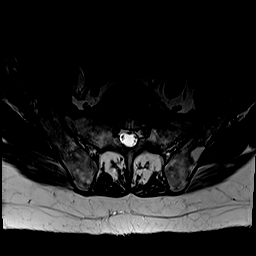
[im 5/31]
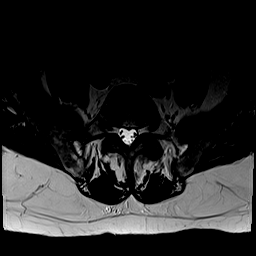
[im 10/31]
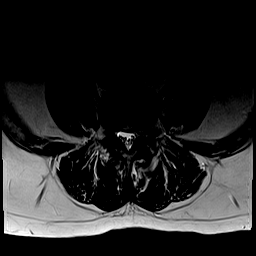
[im 14/31]
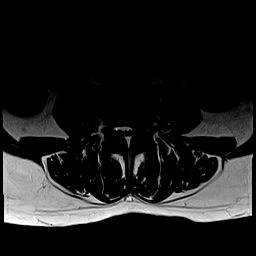
[im 17/31]
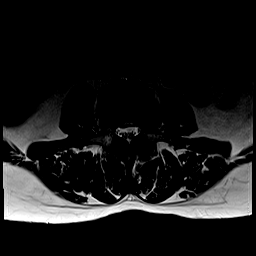
[im 21/31]
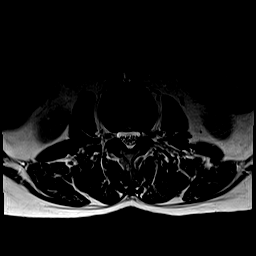
[im 26/31]
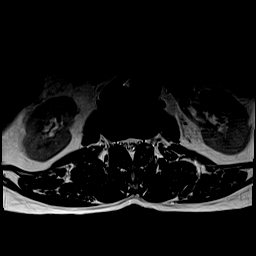
[im 31/31]
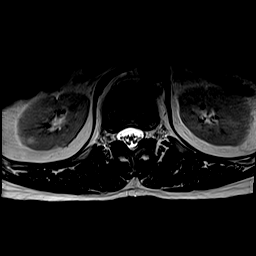

[Series 9: T1 · axial · 4.0mm · 0.39mm/px · z∈[-112,+95]mm · 8 of 31 slices shown (2 of 2)]
[im 1/31]
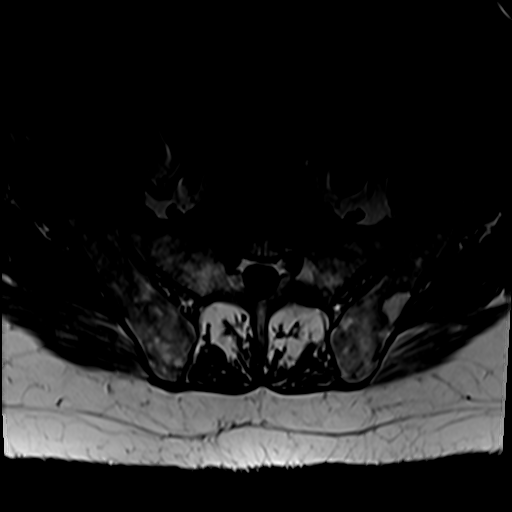
[im 5/31]
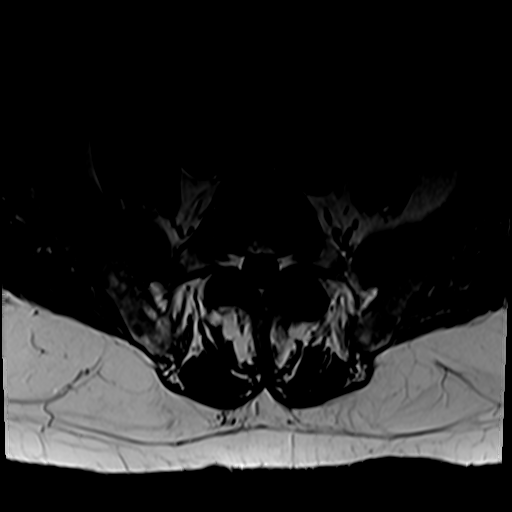
[im 10/31]
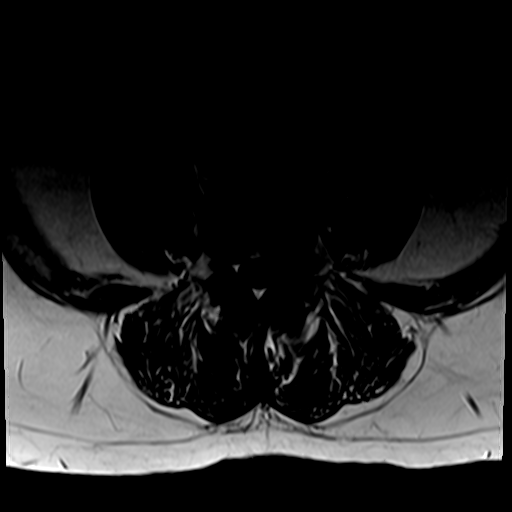
[im 14/31]
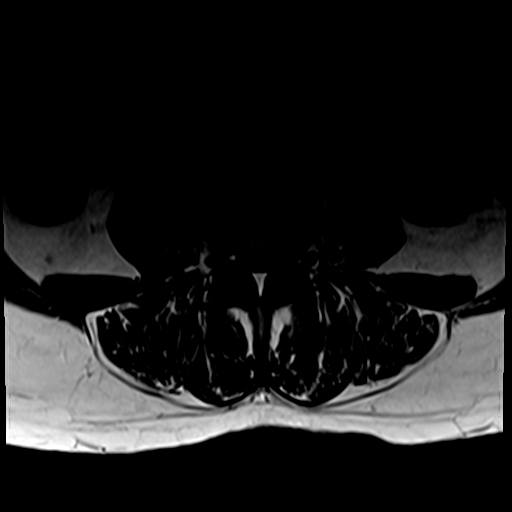
[im 17/31]
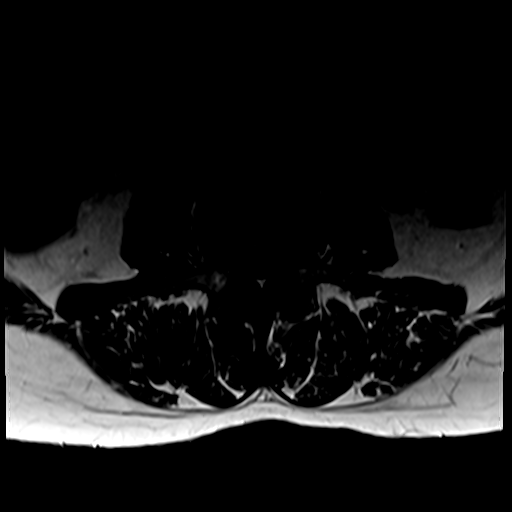
[im 21/31]
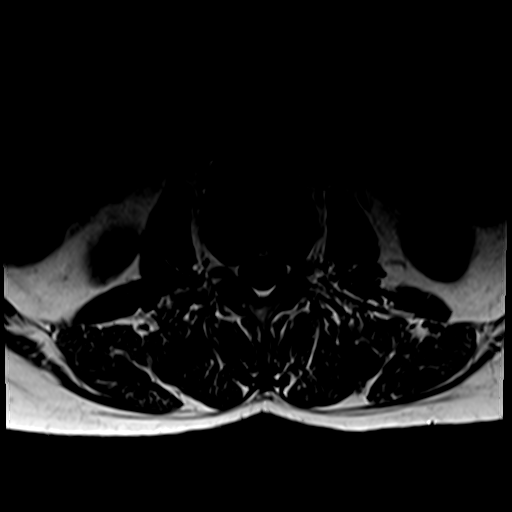
[im 26/31]
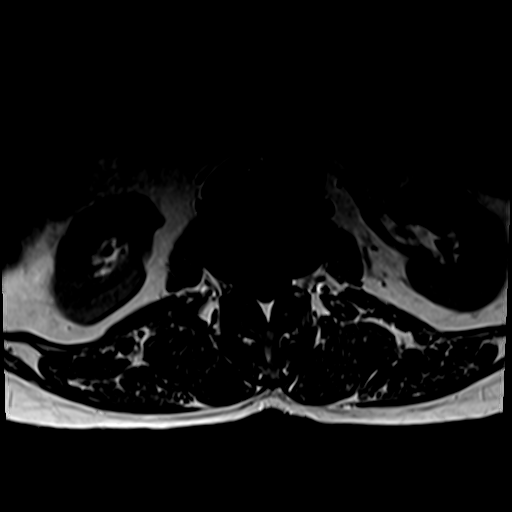
[im 31/31]
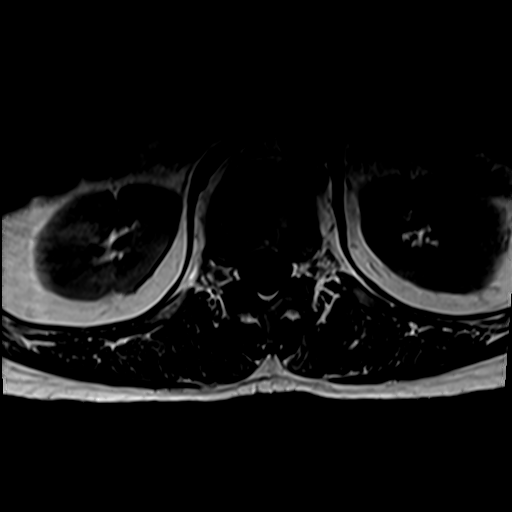

[29 of 48 positions shown; findings below may reference images not displayed]

FINDINGS: Segmentation: For the purposes of this dictation, five lumbar
vertebrae are assumed and the caudal most well-formed intervertebral
disc is designated L5-S1.

Alignment:  Mild L1-L2, L2-L3 and L5-S1 grade 1 retrolisthesis.

Vertebrae: Vertebral body height is maintained. Moderate marrow
edema is present within the left L4 and L5 pedicles and articular
pillars as well as L4 spinous process. This edema is likely
degenerative in the absence of any signs or symptoms of infection.

Conus medullaris and cauda equina: Conus extends to the T12-L1
level. No signal abnormality within the visualized distal spinal
cord.

Paraspinal and other soft tissues: Small T2 hyperintense renal
lesions bilaterally, incompletely assessed but possibly reflecting
cysts. The partially imaged urinary bladder is distended paraspinal
soft tissues within normal limits.

Disc levels:

Unless otherwise stated, the level by level findings below have not
significantly changed since prior MRI 01/04/2011.

Progressive moderate L5-S1 disc degeneration. No more than mild disc
degeneration at the remaining levels.

T11-T12: This level is imaged sagittally. Small disc bulge. No
significant spinal canal or foraminal stenosis.

T12-L1: Small disc bulge. No significant spinal canal or foraminal
stenosis.

L1-L2: Disc bulge. Superimposed tiny right center disc protrusion. A
previously demonstrated left center disc protrusion has regressed
and is no longer appreciated. Mild facet arthrosis/ligamentum flavum
hypertrophy. Mild bilateral subarticular and central canal narrowing
without nerve root impingement. Mild bilateral neural foraminal
narrowing, new from the prior exam.

L2-L3: Posterior annular fissure. Disc bulge. Mild facet
arthrosis/ligamentum flavum hypertrophy. Mild bilateral subarticular
and mild/moderate central canal stenosis, not significantly changed.
Bilateral neural foraminal narrowing, unchanged (mild right,
moderate left).

L3-L4: Progressive disc bulge. New superimposed left
subarticular/foraminal disc extrusion. Progressive moderate facet
arthrosis with ligamentum flavum hypertrophy. The disc protrusion
contributes to moderate left subarticular stenosis, crowding the
descending left L4 nerve root. Progressive mild right subarticular
and central canal stenosis. Progressive moderate/severe bilateral
neural foraminal narrowing. Additionally, there is extruded disc
material posterior to the L4 vertebral body on the left (series 5,
image 9). It is unclear if this extruded disc material is arising
from the L3-L4 or L4-L5 discs and sequestered disc material cannot
be excluded.

L4-L5: Progressive disc bulge. New left foraminal/extraforaminal
disc protrusion (series 8, image 23) (series 5, image 11).
Progressive moderate facet arthrosis with ligamentum flavum
hypertrophy. Trace left facet joint effusion. Progressive mild left
subarticular narrowing without nerve root impingement. Progressive
bilateral neural foraminal narrowing (mild right, moderate/severe
left).

L5-S1: Disc bulge. Redemonstrated broad-based disc extrusion
spanning the central and subarticular zones with minimal caudal
migration. Progressive moderate facet arthrosis and left greater
than right ligamentum flavum hypertrophy. As before, the disc
extrusion contributes to moderate/severe left subarticular stenosis,
encroaching upon the descending left S1 nerve root. Mild right
subarticular and central canal narrowing. No significant foraminal
stenosis
IMPRESSION: Lumbar spondylosis as outlined and having progressed at several
levels since the prior MRI of 01/04/2011. Findings are most notably
as follows.

At L3-L4, there is a new left subarticular/foraminal disc extrusion
which contributes to moderate left subarticular stenosis, crowding
the descending left L4 nerve root. This also contributes to
moderate/severe left neural foraminal narrowing. Multifactorial
progressive moderate/severe right neural foraminal narrowing.
Additionally, there is extruded disc material posterior to the L4
vertebral body on the left. It is unclear if this extruded disc
material has arisen from the L3-L4 or L4-L5 discs and sequestered
disc material cannot be excluded.

At L4-L5, there is a new left foraminal/extraforaminal disc
protrusion. This contributes to progressive moderate/severe left
neural foraminal narrowing. It also contributes to mild left
subarticular narrowing without nerve root impingement.

At L5-S1, redemonstrated broad-based central disc extrusion spanning
the central and bilateral subarticular zones with minimal caudal
migration. As before, the disc extrusion contributes to
moderate/severe left subarticular stenosis, encroaching upon the
descending left S1 nerve root.

Also new from the prior MRI, there is moderate edema within the left
L4 and L5 pedicles/articular pillars and L4 spinous process. This
edema is likely degenerative in the absence of any signs or symptoms
of infection. Trace left L3-L4 and L4-L5 facet joint effusions.

## 2022-05-02 IMAGING — CR DG TIBIA/FIBULA 2V*L*
4 series · 4 of 4 positions shown · non-contrast
Comparison: None.

CLINICAL DATA: Left leg pain

EXAM:
LEFT TIBIA AND FIBULA - 2 VIEW

[tibia ap (1 of 2)]
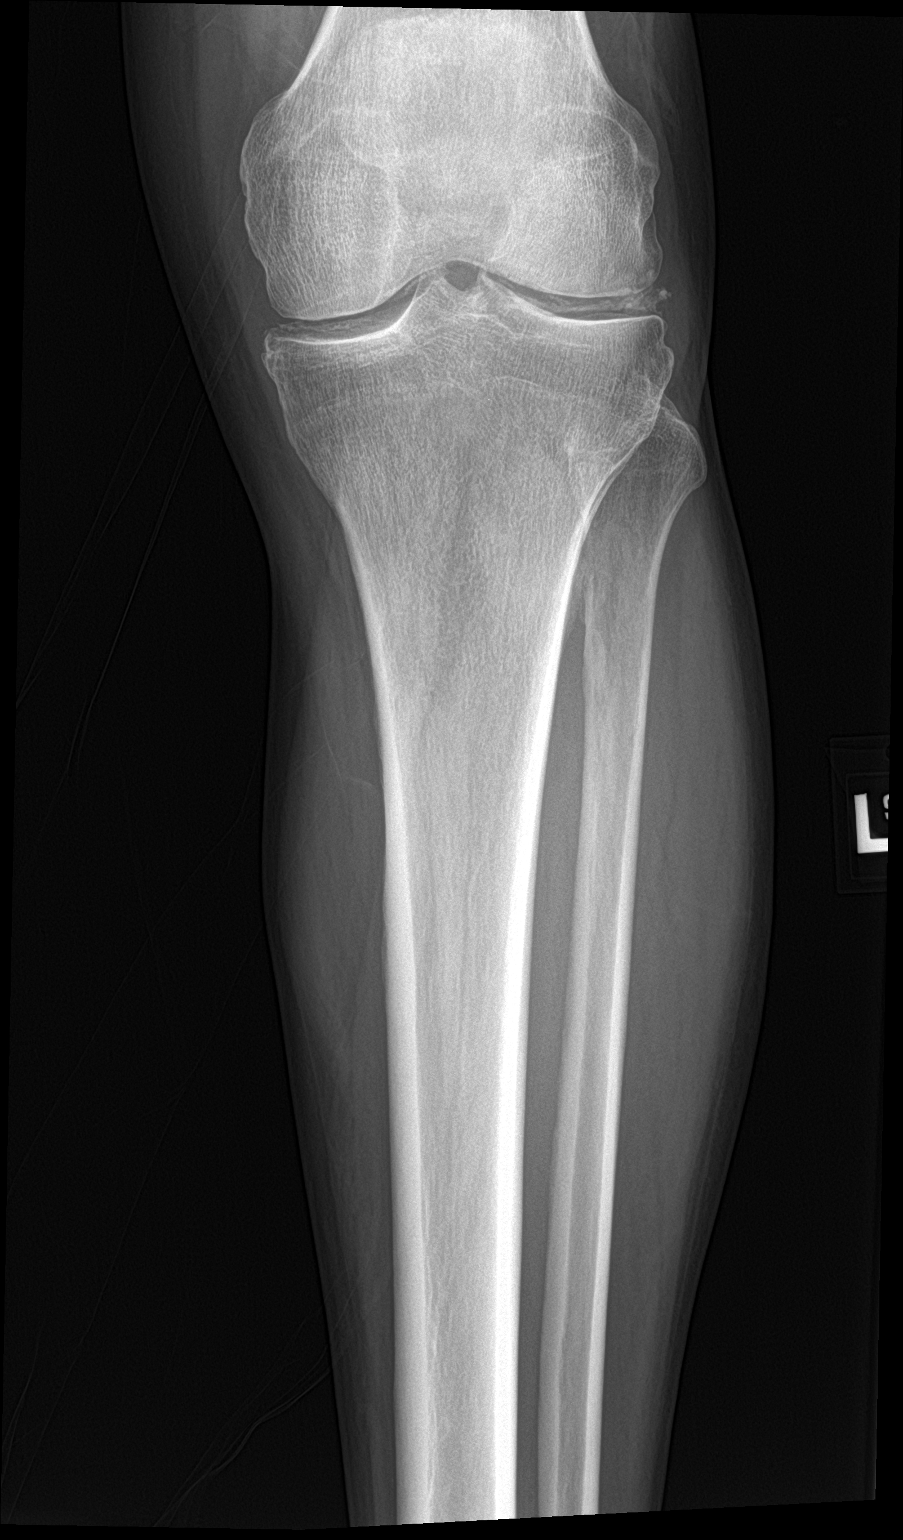

[tibia ap (2 of 2)]
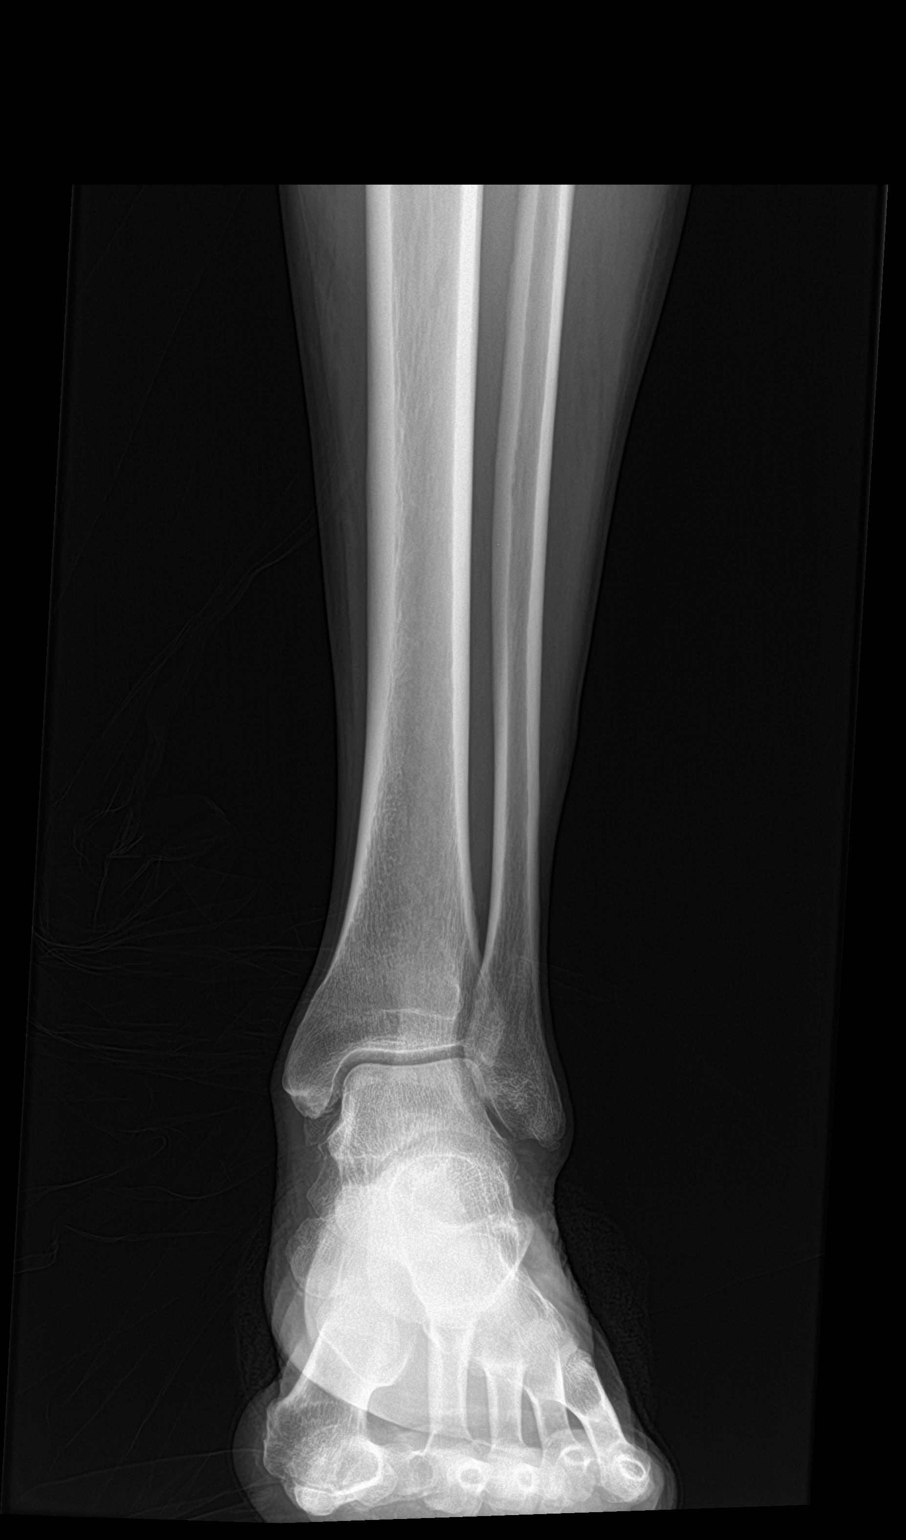

[tibia lat (1 of 2)]
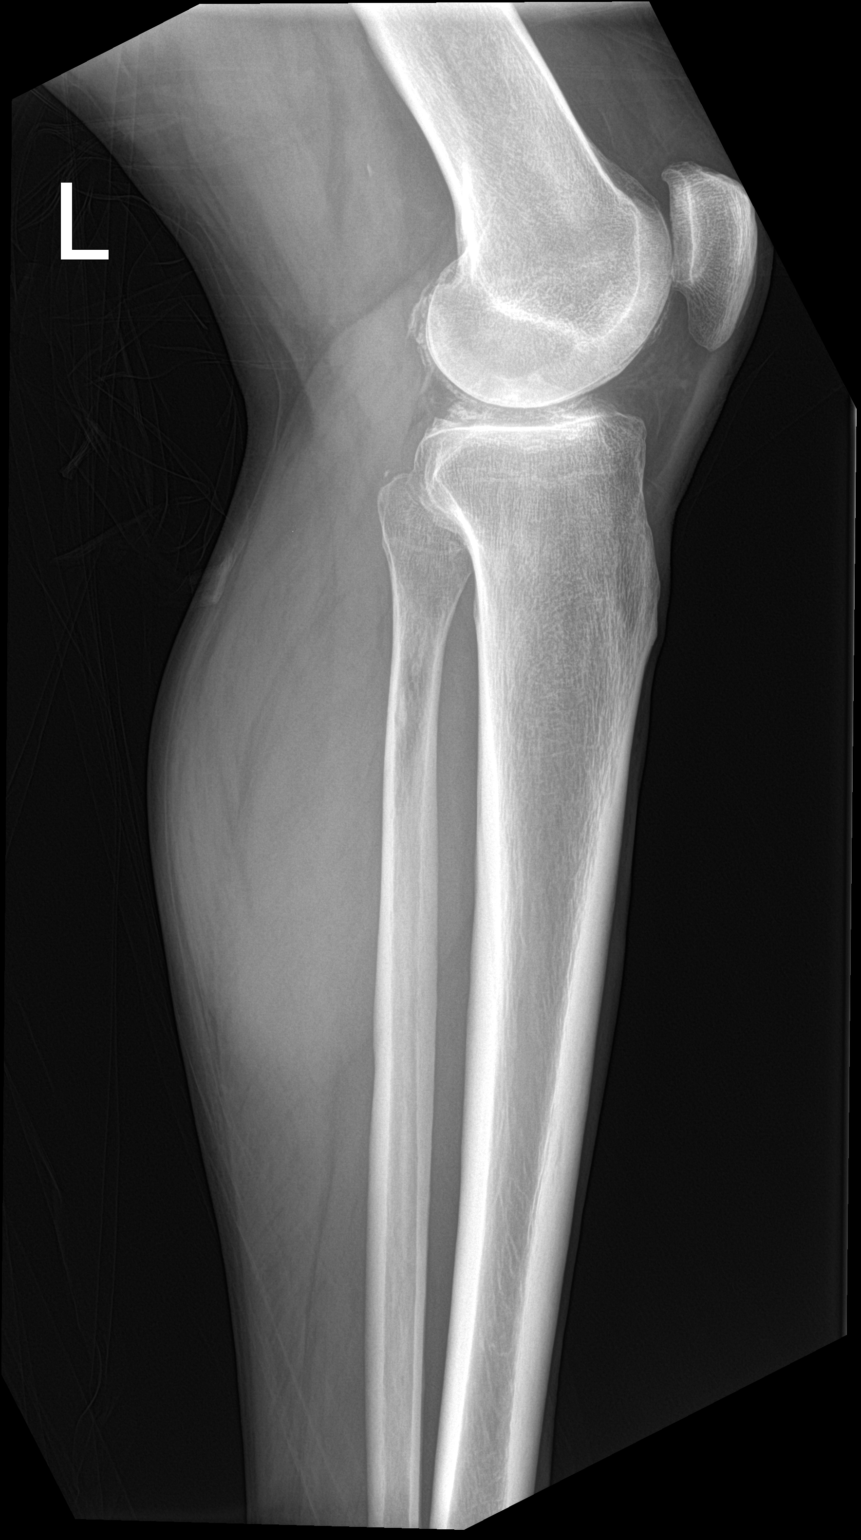

[tibia lat (2 of 2)]
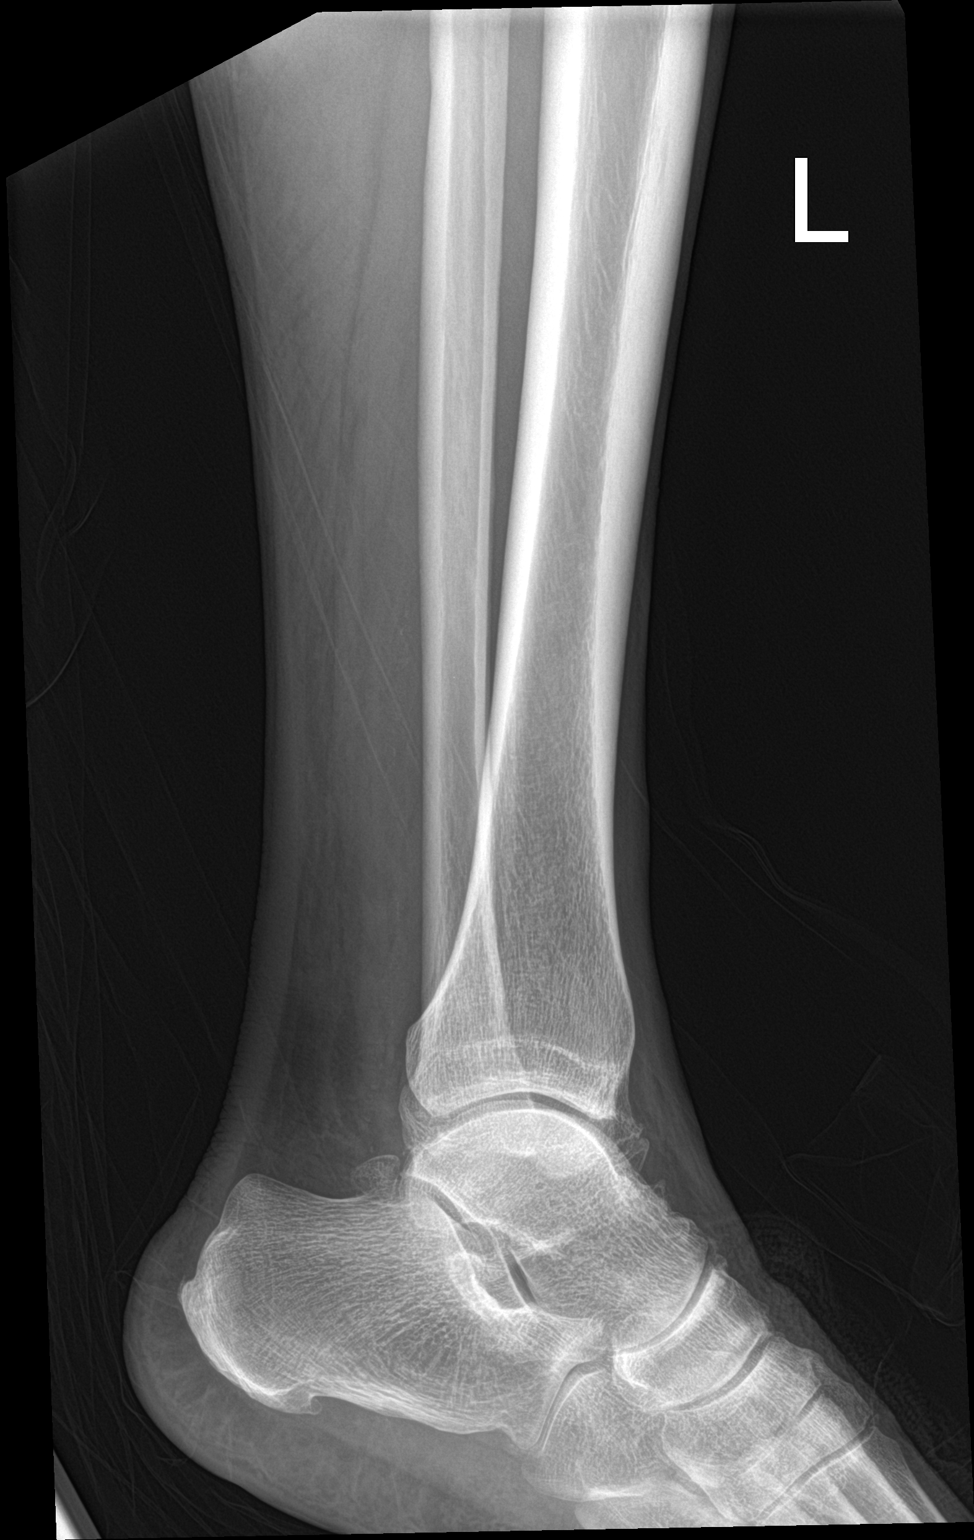

[4 of 4 positions shown; findings below may reference images not displayed]

FINDINGS: Frontal and lateral views of the left tibia and fibula demonstrate
no fractures. Alignment is anatomic. Medial and lateral
compartmental osteoarthritis within the left knee, with extensive
chondrocalcinosis. Mild osteoarthritis of the tibiotalar joint. Soft
tissues are unremarkable.
IMPRESSION: 1. Osteoarthritis of the left knee and ankle. No acute bony
abnormality.

## 2022-05-13 ENCOUNTER — Ambulatory Visit (INDEPENDENT_AMBULATORY_CARE_PROVIDER_SITE_OTHER): Payer: Medicare Other | Admitting: Nurse Practitioner

## 2022-05-13 ENCOUNTER — Encounter: Payer: Self-pay | Admitting: Nurse Practitioner

## 2022-05-13 VITALS — BP 115/81 | HR 93 | Temp 98.0°F | Ht 67.0 in | Wt 166.3 lb

## 2022-05-13 DIAGNOSIS — J4540 Moderate persistent asthma, uncomplicated: Secondary | ICD-10-CM

## 2022-05-13 DIAGNOSIS — I10 Essential (primary) hypertension: Secondary | ICD-10-CM | POA: Diagnosis not present

## 2022-05-13 DIAGNOSIS — E7800 Pure hypercholesterolemia, unspecified: Secondary | ICD-10-CM

## 2022-05-13 MED ORDER — FLUTICASONE FUROATE-VILANTEROL 100-25 MCG/ACT IN AEPB: 1.0000 | INHALATION_SPRAY | Freq: Every day | RESPIRATORY_TRACT | 4 refills | Status: DC

## 2022-05-13 NOTE — Progress Notes (Signed)
BP 115/81   Pulse 93   Temp 98 F (36.7 C) (Oral)   Ht '5\' 7"'  (1.702 m)   Wt 166 lb 4.8 oz (75.4 kg)   SpO2 98%   BMI 26.05 kg/m    Subjective:    Patient ID: David Quinn, male    DOB: Jan 15, 1956, 66 y.o.   MRN: [id number]  HPI: David Quinn is a 67 y.o. male  Chief Complaint  Patient presents with   Hyperlipidemia   Hypertension   Asthma   HYPERTENSION / HYPERLIPIDEMIA Taking Lisinopril and Crestor daily. Satisfied with current treatment? yes Duration of hypertension: chronic BP monitoring frequency: not checking BP range:  BP medication side effects: no Duration of hyperlipidemia: chronic Aspirin: no Recent stressors: no Recurrent headaches: no Visual changes: no Palpitations: no Dyspnea: no Chest pain: no Lower extremity edema: no Dizzy/lightheaded: no  The 10-year ASCVD risk score (Arnett DK, et al., 2019) is: 11.4%   Values used to calculate the score:     Age: 14 years     Sex: Male     Is Non-Hispanic African American: Yes     Diabetic: No     Tobacco smoker: No     Systolic Blood Pressure: 888 mmHg     Is BP treated: Yes     HDL Cholesterol: 67 mg/dL     Total Cholesterol: 154 mg/dL  ASTHMA Prescribed Breo and Albuterol, has Albuterol only at this as [facility] was too expensive during transition in insurance.  Has history of low WBC ==  last 02/08/22 = 3.8.  No B symptoms.  Initially presented, in 2020. Asthma status: stable Satisfied with current treatment?: yes Albuterol/rescue inhaler frequency: once a week Dyspnea frequency: no Wheezing frequency: no Cough frequency:  no Nocturnal symptom frequency: none Limitation of activity: no Current upper respiratory symptoms: no Triggers: weather changes Home peak flows: Last Spirometry: unknown Failed/intolerant to following asthma meds: unknown Asthma meds in past:  Aerochamber/spacer use: no Visits to ER or Urgent Care in past year: no Pneumovax: Not up to Date Influenza: refuses    Relevant past medical, surgical, family and social history reviewed and updated as indicated. Interim medical history since our last visit reviewed. Allergies and medications reviewed and updated.  Review of Systems  Constitutional:  Negative for activity change, diaphoresis, fatigue and fever.  Respiratory:  Negative for cough, chest tightness, shortness of breath and wheezing.   Cardiovascular:  Negative for chest pain, palpitations and leg swelling.  Gastrointestinal: Negative.   Neurological: Negative.   Psychiatric/Behavioral: Negative.     Per HPI unless specifically indicated above     Objective:    BP 115/81   Pulse 93   Temp 98 F (36.7 C) (Oral)   Ht '5\' 7"'  (1.702 m)   Wt 166 lb 4.8 oz (75.4 kg)   SpO2 98%   BMI 26.05 kg/m   Wt Readings from Last 3 Encounters:  05/13/22 166 lb 4.8 oz (75.4 kg)  02/08/22 165 lb 9.6 oz (75.1 kg)  01/29/21 174 lb 6.4 oz (79.1 kg)    Physical Exam Vitals and nursing note reviewed.  Constitutional:      General: He is awake. He is not in acute distress.    Appearance: He is well-developed and well-groomed. He is not ill-appearing or toxic-appearing.  HENT:     Head: Normocephalic and atraumatic.     Right Ear: Hearing normal. No drainage.     Left Ear: Hearing normal. No drainage.  Eyes:     General: Lids are normal.        Right eye: No discharge.        Left eye: No discharge.     Conjunctiva/sclera: Conjunctivae normal.     Pupils: Pupils are equal, round, and reactive to light.  Neck:     Thyroid: No thyromegaly.     Vascular: No carotid bruit.  Cardiovascular:     Rate and Rhythm: Normal rate and regular rhythm.     Heart sounds: Normal heart sounds, S1 normal and S2 normal. No murmur heard.    No gallop.  Pulmonary:     Effort: Pulmonary effort is normal. No accessory muscle usage or respiratory distress.     Breath sounds: Normal breath sounds.  Abdominal:     General: Bowel sounds are normal.     Palpations:  Abdomen is soft.  Musculoskeletal:        General: Normal range of motion.     Cervical back: Normal range of motion and neck supple.     Right lower leg: No edema.     Left lower leg: No edema.  Lymphadenopathy:     Head:     Right side of head: No submental, submandibular, tonsillar, preauricular or posterior auricular adenopathy.     Left side of head: No submental, submandibular, tonsillar, preauricular or posterior auricular adenopathy.     Cervical: No cervical adenopathy.  Skin:    General: Skin is warm and dry.  Neurological:     Mental Status: He is alert and oriented to person, place, and time.  Psychiatric:        Mood and Affect: Mood normal.        Behavior: Behavior normal. Behavior is cooperative.        Thought Content: Thought content normal.        Judgment: Judgment normal.     Results for orders placed or performed in visit on 02/08/22  Microalbumin, Urine Waived  Result Value Ref Range   Microalb, Ur Waived 150 (H) 0 - 19 mg/L   Creatinine, Urine Waived 200 10 - 300 mg/dL   Microalb/Creat Ratio 30-300 (H) <30 mg/g  Comprehensive metabolic panel  Result Value Ref Range   Glucose 84 70 - 99 mg/dL   BUN 13 8 - 27 mg/dL   Creatinine, Ser 0.97 0.76 - 1.27 mg/dL   eGFR 87 >59 mL/min/1.73   BUN/Creatinine Ratio 13 10 - 24   Sodium 141 134 - 144 mmol/L   Potassium 4.7 3.5 - 5.2 mmol/L   Chloride 103 96 - 106 mmol/L   CO2 25 20 - 29 mmol/L   Calcium 9.6 8.6 - 10.2 mg/dL   Total Protein 7.3 6.0 - 8.5 g/dL   Albumin 4.4 3.8 - 4.8 g/dL   Globulin, Total 2.9 1.5 - 4.5 g/dL   Albumin/Globulin Ratio 1.5 1.2 - 2.2   Bilirubin Total 0.3 0.0 - 1.2 mg/dL   Alkaline Phosphatase 95 44 - 121 IU/L   AST 34 0 - 40 IU/L   ALT 36 0 - 44 IU/L  CBC with Differential/Platelet  Result Value Ref Range   WBC 3.8 3.4 - 10.8 x10E3/uL   RBC 5.69 4.14 - 5.80 x10E6/uL   Hemoglobin 14.9 13.0 - 17.7 g/dL   Hematocrit 47.1 37.5 - 51.0 %   MCV 83 79 - 97 fL   MCH 26.2 (L) 26.6 -  33.0 pg   MCHC 31.6 31.5 - 35.7 g/dL  RDW 12.9 11.6 - 15.4 %   Platelets 266 150 - 450 x10E3/uL   Neutrophils 40 Not Estab. %   Lymphs 35 Not Estab. %   Monocytes 15 Not Estab. %   Eos 9 Not Estab. %   Basos 1 Not Estab. %   Neutrophils Absolute 1.5 1.4 - 7.0 x10E3/uL   Lymphocytes Absolute 1.3 0.7 - 3.1 x10E3/uL   Monocytes Absolute 0.6 0.1 - 0.9 x10E3/uL   EOS (ABSOLUTE) 0.4 0.0 - 0.4 x10E3/uL   Basophils Absolute 0.0 0.0 - 0.2 x10E3/uL   Immature Granulocytes 0 Not Estab. %   Immature Grans (Abs) 0.0 0.0 - 0.1 x10E3/uL  Lipid Panel w/o Chol/HDL Ratio  Result Value Ref Range   Cholesterol, Total 154 100 - 199 mg/dL   Triglycerides 83 0 - 149 mg/dL   HDL 67 >39 mg/dL   VLDL Cholesterol Cal 16 5 - 40 mg/dL   LDL Chol Calc (NIH) 71 0 - 99 mg/dL  PSA  Result Value Ref Range   Prostate Specific Ag, Serum 2.4 0.0 - 4.0 ng/mL  TSH  Result Value Ref Range   TSH 1.700 0.450 - 4.500 uIU/mL      Assessment & Plan:   Problem List Items Addressed This Visit       Cardiovascular and Mediastinum   Hypertension - Primary    Chronic, ongoing with BP well below goal today.  Continue Lisinopril daily.  Recommend he monitor BP at least a few mornings a week at home and document.  DASH diet at home.  Continue current medication regimen and adjust as needed.  Labs: up to date.  Return in May 2024 for physical.        Respiratory   Asthma, moderate persistent    Chronic, ongoing. Continue Albuterol as needed and work on [employer] lowering (sent to mail order per his request), may need to adjust depending on coverage.  Adjust regimen as needed based on symptoms.  Spirometry next visit.  Return in May 2024 for physical.      Relevant Medications   fluticasone furoate-vilanterol (BREO ELLIPTA) 100-25 MCG/ACT AEPB     Other   Hyperlipidemia    Chronic, ongoing.  Continue current medication regimen and adjust as needed. Lipid panel up to date.  Return in May 2024.        Follow up  plan: Return in about 8 months (around 01/01/2023) for Annual physical -- due after 12/30/22 -- with spirometry.

## 2022-05-13 NOTE — Assessment & Plan Note (Signed)
Chronic, ongoing. Continue Albuterol as needed and work on [employer] lowering (sent to mail order per his request), may need to adjust depending on coverage.  Adjust regimen as needed based on symptoms.  Spirometry next visit.  Return in May 2024 for physical.

## 2022-05-13 NOTE — Patient Instructions (Signed)

## 2022-05-13 NOTE — Assessment & Plan Note (Signed)
Chronic, ongoing with BP well below goal today.  Continue Lisinopril daily.  Recommend he monitor BP at least a few mornings a week at home and document.  DASH diet at home.  Continue current medication regimen and adjust as needed.  Labs: up to date.  Return in May 2024 for physical.

## 2022-05-13 NOTE — Assessment & Plan Note (Signed)
Chronic, ongoing.  Continue current medication regimen and adjust as needed. Lipid panel up to date.  Return in May 2024.

## 2022-08-12 ENCOUNTER — Other Ambulatory Visit: Payer: Self-pay | Admitting: Nurse Practitioner

## 2022-08-12 NOTE — Telephone Encounter (Signed)
PT came in to change pharmacy from [facility] to [facility] here in [place].  Requested refills on Fluticasone Furoate-Vilanterol, Albuterol, and Rosuvastatin.  I explained to him that with med management he may need an appointment, he was seen in September but don't have another appointment until 05/26 for a physical.  PT would like phone call either way.  Please advise.

## 2022-08-13 MED ORDER — ALBUTEROL SULFATE HFA 108 (90 BASE) MCG/ACT IN AERS: 2.0000 | INHALATION_SPRAY | Freq: Four times a day (QID) | RESPIRATORY_TRACT | 3 refills | Status: DC | PRN

## 2022-08-13 MED ORDER — ROSUVASTATIN CALCIUM 10 MG PO TABS: 10.0000 mg | ORAL_TABLET | Freq: Every day | ORAL | 4 refills | Status: DC

## 2022-08-13 MED ORDER — FLUTICASONE FUROATE-VILANTEROL 100-25 MCG/ACT IN AEPB: 1.0000 | INHALATION_SPRAY | Freq: Every day | RESPIRATORY_TRACT | 4 refills | Status: DC

## 2022-08-13 NOTE — Telephone Encounter (Signed)
Patient is requesting refills for Breo, Abuterol, and Crestor 10 mg

## 2022-11-06 ENCOUNTER — Telehealth: Payer: Self-pay | Admitting: Nurse Practitioner

## 2022-11-06 NOTE — Telephone Encounter (Signed)
Copied from [place] [id number]. Topic: Medicare AWV >> Nov 06, 2022 10:18 AM Devoria Glassing wrote: Reason for CRM: Called patient to schedule Medicare Annual Wellness Visit (AWV). Left message for patient to call back and schedule Medicare Annual Wellness Visit (AWV).  Last date of AWV: none  Please schedule an appointment at any time with Kirke Shaggy, LPN .  If any questions, please contact me.  Thank you ,  Sherol Dade; [place] Direct Dial: [phone]

## 2022-11-11 ENCOUNTER — Telehealth: Payer: Self-pay | Admitting: Nurse Practitioner

## 2022-11-11 NOTE — Telephone Encounter (Signed)
[facility] to schedule their annual wellness visit. Patient declined to schedule AWV at this time.  Sherol Dade; Care Guide Ambulatory Clinical [facility] Direct Dial: [phone]

## 2022-12-26 ENCOUNTER — Telehealth: Payer: Self-pay | Admitting: Nurse Practitioner

## 2022-12-26 NOTE — Telephone Encounter (Signed)
Copied from CRM [id number]. Topic: Medicare AWV >> Dec 26, 2022  9:41 AM Payton Doughty wrote: Reason for CRM: Called patient to schedule Medicare Annual Wellness Visit (AWV). Left message for patient to call back and schedule Medicare Annual Wellness Visit (AWV).  Last date of AWV: NONE  Please schedule an appointment at any time with Kennedy Bucker, LPN  .  If any questions, please contact me.  Thank you ,  Verlee Rossetti; Care Guide Ambulatory Clinical Support [place] [facility] Direct Dial: [phone]

## 2023-01-11 NOTE — Patient Instructions (Signed)

## 2023-01-13 ENCOUNTER — Ambulatory Visit (INDEPENDENT_AMBULATORY_CARE_PROVIDER_SITE_OTHER): Payer: Medicare HMO | Admitting: Nurse Practitioner

## 2023-01-13 ENCOUNTER — Encounter: Payer: Self-pay | Admitting: Nurse Practitioner

## 2023-01-13 VITALS — BP 126/80 | HR 82 | Temp 97.6°F | Ht 67.01 in | Wt 161.0 lb

## 2023-01-13 DIAGNOSIS — Z1211 Encounter for screening for malignant neoplasm of colon: Secondary | ICD-10-CM

## 2023-01-13 DIAGNOSIS — D709 Neutropenia, unspecified: Secondary | ICD-10-CM | POA: Diagnosis not present

## 2023-01-13 DIAGNOSIS — E7800 Pure hypercholesterolemia, unspecified: Secondary | ICD-10-CM | POA: Diagnosis not present

## 2023-01-13 DIAGNOSIS — J4540 Moderate persistent asthma, uncomplicated: Secondary | ICD-10-CM

## 2023-01-13 DIAGNOSIS — I10 Essential (primary) hypertension: Secondary | ICD-10-CM | POA: Diagnosis not present

## 2023-01-13 DIAGNOSIS — R801 Persistent proteinuria, unspecified: Secondary | ICD-10-CM | POA: Diagnosis not present

## 2023-01-13 DIAGNOSIS — N400 Enlarged prostate without lower urinary tract symptoms: Secondary | ICD-10-CM

## 2023-01-13 DIAGNOSIS — Z0000 Encounter for general adult medical examination without abnormal findings: Secondary | ICD-10-CM | POA: Diagnosis not present

## 2023-01-13 MED ORDER — LISINOPRIL 10 MG PO TABS: ORAL_TABLET | ORAL | 4 refills | Status: DC

## 2023-01-13 MED ORDER — ROSUVASTATIN CALCIUM 10 MG PO TABS: 10.0000 mg | ORAL_TABLET | Freq: Every day | ORAL | 4 refills | Status: DC

## 2023-01-13 MED ORDER — FLUTICASONE FUROATE-VILANTEROL 100-25 MCG/ACT IN AEPB: 1.0000 | INHALATION_SPRAY | Freq: Every day | RESPIRATORY_TRACT | 4 refills | Status: DC

## 2023-01-13 MED ORDER — ALBUTEROL SULFATE HFA 108 (90 BASE) MCG/ACT IN AERS: 2.0000 | INHALATION_SPRAY | Freq: Four times a day (QID) | RESPIRATORY_TRACT | 4 refills | Status: DC | PRN

## 2023-01-13 NOTE — Assessment & Plan Note (Signed)
Noted on past checks, last visit was 150.  Continue Lisinopril for kidney protection.  Blood work obtained today.

## 2023-01-13 NOTE — Assessment & Plan Note (Signed)
Chronic, ongoing. Continue Albuterol as needed and Breo maintenance.  Adjust regimen as needed based on symptoms.  Spirometry next visit.  Return in 6 months.  Refills sent.

## 2023-01-13 NOTE — Progress Notes (Signed)
BP 126/80 (BP Location: Left Arm, Patient Position: Sitting, Cuff Size: Normal)   Pulse 82   Temp 97.6 F (36.4 C) (Oral)   Ht 5' 7.01" (1.702 m)   Wt 161 lb (73 kg)   SpO2 100%   BMI 25.21 kg/m    Subjective:    Patient ID: David Quinn, male    DOB: 1956-03-29, 67 y.o.   MRN: [id number]  HPI: David Quinn is a 67 y.o. male presenting on 01/13/2023 for Medicare Wellness and comprehensive medical examination. Current medical complaints include:none  He currently lives with: significant other Interim Problems from his last visit: no  HYPERTENSION / HYPERLIPIDEMIA Taking Lisinopril and Crestor daily. Satisfied with current treatment? yes Duration of hypertension: chronic BP monitoring frequency: not checking BP range:  BP medication side effects: no Duration of hyperlipidemia: chronic Aspirin: no Recent stressors: no Recurrent headaches: no Visual changes: no Palpitations: no Dyspnea: no Chest pain: no Lower extremity edema: no Dizzy/lightheaded: no  The 10-year ASCVD risk score (Arnett DK, et al., 2019) is: 13.9%   Values used to calculate the score:     Age: 73 years     Sex: Male     Is Non-Hispanic African American: Yes     Diabetic: No     Tobacco smoker: No     Systolic Blood Pressure: 126 mmHg     Is BP treated: Yes     HDL Cholesterol: 67 mg/dL     Total Cholesterol: 154 mg/dL   ASTHMA Using Breo and Albuterol.  Has history of low WBC ==  last 02/08/22 = 3.8.  No B symptoms.  Initially presented, in 2020. Asthma status: stable Satisfied with current treatment?: yes Albuterol/rescue inhaler frequency: once a week Dyspnea frequency: no Wheezing frequency: no Cough frequency:  no Nocturnal symptom frequency: none Limitation of activity: no Current upper respiratory symptoms: no Triggers: weather changes Home peak flows: Last Spirometry: unknown Failed/intolerant to following asthma meds: unknown Asthma meds in past:  Aerochamber/spacer  use: no Visits to ER or Urgent Care in past year: no Pneumovax: Not up to Date Influenza: refuses   Functional Status Survey: Is the patient deaf or have difficulty hearing?: No Does the patient have difficulty seeing, even when wearing glasses/contacts?: No Does the patient have difficulty concentrating, remembering, or making decisions?: No Does the patient have difficulty walking or climbing stairs?: No Does the patient have difficulty dressing or bathing?: No Does the patient have difficulty doing errands alone such as visiting a doctor's office or shopping?: No     08/01/2020   10:00 AM 08/01/2020    8:59 PM 08/02/2020    7:39 AM 05/13/2022    4:00 PM 01/13/2023    2:21 PM  Fall Risk  Falls in the past year?    0 0  Was there an injury with Fall?    0 0  Fall Risk Category Calculator    0 0  Fall Risk Category (Retired)    Low   (RETIRED) Patient Fall Risk Level High fall risk High fall risk High fall risk Low fall risk   Patient at Risk for Falls Due to    No Fall Risks No Fall Risks  Fall risk Follow up    Falls evaluation completed Falls evaluation completed       01/13/2023    2:21 PM 05/13/2022    4:01 PM 02/08/2022    4:10 PM 12/29/2020   10:13 AM 11/19/2019    3:13 PM  Depression screen PHQ 2/9  Decreased Interest 0 0 0 0 0  Down, Depressed, Hopeless 0 0 0 0 0  PHQ - 2 Score 0 0 0 0 0  Altered sleeping 0 0 0    Tired, decreased energy 0 0 0    Change in appetite 0 0 0    Feeling bad or failure about yourself  0 0 0    Trouble concentrating 0 0 0    Moving slowly or fidgety/restless 0 0 0    Suicidal thoughts 0 0 0    PHQ-9 Score 0 0 0    Difficult doing work/chores Not difficult at all Not difficult at all Not difficult at all         01/13/2023    2:21 PM 05/13/2022    4:01 PM 02/08/2022    4:10 PM  GAD 7 : Generalized Anxiety Score  Nervous, Anxious, on Edge 0 0 0  Control/stop worrying 0 0 0  Worry too much - different things 0 0 0  Trouble relaxing 0 0 0   Restless 0 0 0  Easily annoyed or irritable 0 0 0  Afraid - awful might happen 0 0 0  Total GAD 7 Score 0 0 0  Anxiety Difficulty Not difficult at all Not difficult at all      Advanced Directives Does patient have a HCPOA?    no If yes, name and contact information:  Does patient have a living will or MOST form?  no  Past Medical History:  Past Medical History:  Diagnosis Date   Asthma    Hypertension     Surgical History:  Past Surgical History:  Procedure Laterality Date   JOINT REPLACEMENT Bilateral    hip replacement   LUMBAR LAMINECTOMY/DECOMPRESSION MICRODISCECTOMY Left 07/29/2020   Procedure: LUMBAR LAMINECTOMY/DECOMPRESSION MICRODISCECTOMY 2 LEVELS;  Surgeon: Venetia Night, MD;  Location: [facility] ORS;  Service: Neurosurgery;  Laterality: Left;   TRACHEOSTOMY      Medications:  No current outpatient medications on file prior to visit.   No current facility-administered medications on file prior to visit.    Allergies:  No Known Allergies  Social History:  Social History   Socioeconomic History   Marital status: Married    Spouse name: Not on file   Number of children: Not on file   Years of education: Not on file   Highest education level: Not on file  Occupational History   Not on file  Tobacco Use   Smoking status: Never   Smokeless tobacco: Never  Vaping Use   Vaping Use: Never used  Substance and Sexual Activity   Alcohol use: No    Alcohol/week: 0.0 standard drinks of alcohol   Drug use: No   Sexual activity: Yes  Other Topics Concern   Not on file  Social History Narrative   Not on file   Social Determinants of Health   Financial Resource Strain: Low Risk  (01/13/2023)   Overall Financial Resource Strain (CARDIA)    Difficulty of Paying Living Expenses: Not hard at all  Food Insecurity: No Food Insecurity (01/13/2023)   Hunger Vital Sign    Worried About Running Out of Food in the Last Year: Never true    Ran Out of Food in the  Last Year: Never true  Transportation Needs: No Transportation Needs (01/13/2023)   PRAPARE - Administrator, Civil Service (Medical): No    Lack of Transportation (Non-Medical): No  Physical Activity: Insufficiently  Active (01/13/2023)   Exercise Vital Sign    Days of Exercise per Week: 2 days    Minutes of Exercise per Session: 20 min  Stress: No Stress Concern Present (01/13/2023)   [employer] of Occupational Health - Occupational Stress Questionnaire    Feeling of Stress : Not at all  Social Connections: Socially Integrated (01/13/2023)   Social Connection and Isolation Panel [NHANES]    Frequency of Communication with Friends and Family: More than three times a week    Frequency of Social Gatherings with Friends and Family: More than three times a week    Attends Religious Services: More than 4 times per year    Active Member of [employer] or Organizations: Yes    Attends Banker Meetings: Never    Marital Status: Married  Catering manager Violence: Not At Risk (01/13/2023)   Humiliation, Afraid, Rape, and Kick questionnaire    Fear of Current or Ex-Partner: No    Emotionally Abused: No    Physically Abused: No    Sexually Abused: No   Social History   Tobacco Use  Smoking Status Never  Smokeless Tobacco Never   Social History   Substance and Sexual Activity  Alcohol Use No   Alcohol/week: 0.0 standard drinks of alcohol    Family History:  Family History  Problem Relation Age of Onset   Asthma Mother    Cancer Mother    Hyperlipidemia Mother    Hypertension Mother    Hypertension Father    Stroke Father    Cancer Brother        stomach   Seizures Brother     Past medical history, surgical history, medications, allergies, family history and social history reviewed with patient today and changes made to appropriate areas of the chart.   ROS All other ROS negative except what is listed above and in the HPI.      Objective:    BP  126/80 (BP Location: Left Arm, Patient Position: Sitting, Cuff Size: Normal)   Pulse 82   Temp 97.6 F (36.4 C) (Oral)   Ht 5' 7.01" (1.702 m)   Wt 161 lb (73 kg)   SpO2 100%   BMI 25.21 kg/m   Wt Readings from Last 3 Encounters:  01/13/23 161 lb (73 kg)  05/13/22 166 lb 4.8 oz (75.4 kg)  02/08/22 165 lb 9.6 oz (75.1 kg)    Physical Exam Vitals and nursing note reviewed.  Constitutional:      General: He is awake. He is not in acute distress.    Appearance: He is well-developed and well-groomed. He is not ill-appearing or toxic-appearing.  HENT:     Head: Normocephalic and atraumatic.     Right Ear: Hearing, tympanic membrane, ear canal and external ear normal. No drainage.     Left Ear: Hearing, tympanic membrane, ear canal and external ear normal. No drainage.     Nose: Nose normal.     Mouth/Throat:     Pharynx: Uvula midline.  Eyes:     General: Lids are normal.        Right eye: No discharge.        Left eye: No discharge.     Extraocular Movements: Extraocular movements intact.     Conjunctiva/sclera: Conjunctivae normal.     Pupils: Pupils are equal, round, and reactive to light.     Visual Fields: Right eye visual fields normal and left eye visual fields normal.  Neck:  Thyroid: No thyromegaly.     Vascular: No carotid bruit or JVD.     Trachea: Trachea normal.  Cardiovascular:     Rate and Rhythm: Normal rate and regular rhythm.     Heart sounds: Normal heart sounds, S1 normal and S2 normal. No murmur heard.    No gallop.  Pulmonary:     Effort: Pulmonary effort is normal. No accessory muscle usage or respiratory distress.     Breath sounds: Normal breath sounds.  Abdominal:     General: Bowel sounds are normal.     Palpations: Abdomen is soft. There is no hepatomegaly or splenomegaly.     Tenderness: There is no abdominal tenderness.  Musculoskeletal:        General: Normal range of motion.     Cervical back: Normal range of motion and neck supple.      Right lower leg: No edema.     Left lower leg: No edema.  Lymphadenopathy:     Head:     Right side of head: No submental, submandibular, tonsillar, preauricular or posterior auricular adenopathy.     Left side of head: No submental, submandibular, tonsillar, preauricular or posterior auricular adenopathy.     Cervical: No cervical adenopathy.  Skin:    General: Skin is warm and dry.     Capillary Refill: Capillary refill takes less than 2 seconds.     Findings: No rash.  Neurological:     Mental Status: He is alert and oriented to person, place, and time.     Gait: Gait is intact.     Deep Tendon Reflexes: Reflexes are normal and symmetric.     Reflex Scores:      Brachioradialis reflexes are 2+ on the right side and 2+ on the left side.      Patellar reflexes are 2+ on the right side and 2+ on the left side. Psychiatric:        Attention and Perception: Attention normal.        Mood and Affect: Mood normal.        Speech: Speech normal.        Behavior: Behavior normal. Behavior is cooperative.        Thought Content: Thought content normal.        Cognition and Memory: Cognition normal.       01/13/2023    2:51 PM  6CIT Screen  What Year? 0 points  What month? 0 points  What time? 0 points  Count back from 20 0 points  Months in reverse 0 points  Repeat phrase 0 points  Total Score 0 points    Results for orders placed or performed in visit on 02/08/22  Microalbumin, Urine Waived  Result Value Ref Range   Microalb, Ur Waived 150 (H) 0 - 19 mg/L   Creatinine, Urine Waived 200 10 - 300 mg/dL   Microalb/Creat Ratio 30-300 (H) <30 mg/g  Comprehensive metabolic panel  Result Value Ref Range   Glucose 84 70 - 99 mg/dL   BUN 13 8 - 27 mg/dL   Creatinine, Ser 1.61 0.76 - 1.27 mg/dL   eGFR 87 >09 UE/AVW/0.98   BUN/Creatinine Ratio 13 10 - 24   Sodium 141 134 - 144 mmol/L   Potassium 4.7 3.5 - 5.2 mmol/L   Chloride 103 96 - 106 mmol/L   CO2 25 20 - 29 mmol/L   Calcium  9.6 8.6 - 10.2 mg/dL   Total Protein 7.3 6.0 - 8.5 g/dL  Albumin 4.4 3.8 - 4.8 g/dL   Globulin, Total 2.9 1.5 - 4.5 g/dL   Albumin/Globulin Ratio 1.5 1.2 - 2.2   Bilirubin Total 0.3 0.0 - 1.2 mg/dL   Alkaline Phosphatase 95 44 - 121 IU/L   AST 34 0 - 40 IU/L   ALT 36 0 - 44 IU/L  CBC with Differential/Platelet  Result Value Ref Range   WBC 3.8 3.4 - 10.8 x10E3/uL   RBC 5.69 4.14 - 5.80 x10E6/uL   Hemoglobin 14.9 13.0 - 17.7 g/dL   Hematocrit 08.6 57.8 - 51.0 %   MCV 83 79 - 97 fL   MCH 26.2 (L) 26.6 - 33.0 pg   MCHC 31.6 31.5 - 35.7 g/dL   RDW 46.9 62.9 - 52.8 %   Platelets 266 150 - 450 x10E3/uL   Neutrophils 40 Not Estab. %   Lymphs 35 Not Estab. %   Monocytes 15 Not Estab. %   Eos 9 Not Estab. %   Basos 1 Not Estab. %   Neutrophils Absolute 1.5 1.4 - 7.0 x10E3/uL   Lymphocytes Absolute 1.3 0.7 - 3.1 x10E3/uL   Monocytes Absolute 0.6 0.1 - 0.9 x10E3/uL   EOS (ABSOLUTE) 0.4 0.0 - 0.4 x10E3/uL   Basophils Absolute 0.0 0.0 - 0.2 x10E3/uL   Immature Granulocytes 0 Not Estab. %   Immature Grans (Abs) 0.0 0.0 - 0.1 x10E3/uL  Lipid Panel w/o Chol/HDL Ratio  Result Value Ref Range   Cholesterol, Total 154 100 - 199 mg/dL   Triglycerides 83 0 - 149 mg/dL   HDL 67 >41 mg/dL   VLDL Cholesterol Cal 16 5 - 40 mg/dL   LDL Chol Calc (NIH) 71 0 - 99 mg/dL  PSA  Result Value Ref Range   Prostate Specific Ag, Serum 2.4 0.0 - 4.0 ng/mL  TSH  Result Value Ref Range   TSH 1.700 0.450 - 4.500 uIU/mL      Assessment & Plan:   Problem List Items Addressed This Visit       Cardiovascular and Mediastinum   Hypertension    Chronic, stable.  BP at goal in office today.  Continue Lisinopril daily.  Recommend he monitor BP at least a few mornings a week at home and document.  DASH diet at home.  Continue current medication regimen and adjust as needed.  Labs: CBC, CMP, TSH, urine ALB.  Return in 6 months.  Refills sent in.      Relevant Medications   lisinopril (ZESTRIL) 10 MG tablet    rosuvastatin (CRESTOR) 10 MG tablet   Other Relevant Orders   Microalbumin, Urine Waived   CBC with Differential/Platelet   Comprehensive metabolic panel   TSH     Respiratory   Asthma, moderate persistent    Chronic, ongoing. Continue Albuterol as needed and Breo maintenance.  Adjust regimen as needed based on symptoms.  Spirometry next visit.  Return in 6 months.  Refills sent.      Relevant Medications   fluticasone furoate-vilanterol (BREO ELLIPTA) 100-25 MCG/ACT AEPB   albuterol (PROAIR HFA) 108 (90 Base) MCG/ACT inhaler   Other Relevant Orders   CBC with Differential/Platelet     Other   Hyperlipidemia    Chronic, ongoing.  Continue current medication regimen and adjust as needed. Lipid panel today.  Return in 6 months, refills sent in.      Relevant Medications   lisinopril (ZESTRIL) 10 MG tablet   rosuvastatin (CRESTOR) 10 MG tablet   Other Relevant Orders   Comprehensive  metabolic panel   Lipid Panel w/o Chol/HDL Ratio   Neutropenia (HCC)    Noted on labs, initially in 2020 with WBC 2.5, recent was 3.8 (normal).  Check CBC today.  No B symptoms.  Consider hematology referral if ongoing -- discussed at length with him today.      Relevant Orders   CBC with Differential/Platelet   Proteinuria    Noted on past checks, last visit was 150.  Continue Lisinopril for kidney protection.  Blood work obtained today.      Relevant Orders   Microalbumin, Urine Waived   Comprehensive metabolic panel   Other Visit Diagnoses     Medicare annual wellness visit, initial    -  Primary   Medicare wellnes initial due, performed with patient today.   Benign prostatic hyperplasia without lower urinary tract symptoms       PSA on labs today.   Relevant Orders   PSA   Colon cancer screening       GI referral placed   Relevant Orders   Ambulatory referral to Gastroenterology   Encounter for annual physical exam       Annual physical today with labs and health maintenance  reviewed, discussed with patient.      Discussed aspirin prophylaxis for myocardial infarction prevention and decision was it was not indicated  LABORATORY TESTING:  Health maintenance labs ordered today as discussed above.   The natural history of prostate cancer and ongoing controversy regarding screening and potential treatment outcomes of prostate cancer has been discussed with the patient. The meaning of a false positive PSA and a false negative PSA has been discussed. He indicates understanding of the limitations of this screening test and wishes to proceed with screening PSA testing.   IMMUNIZATIONS:   - Tdap: Tetanus vaccination status reviewed: last tetanus booster within 10 years. - Influenza: Refused - Pneumovax: Refused - Prevnar: Refused - Zostavax vaccine: Refused  SCREENING: - Colonoscopy: Ordered today  Discussed with patient purpose of the colonoscopy is to detect colon cancer at curable precancerous or early stages   - AAA Screening: Not applicable  -Hearing Test: Not applicable  -Spirometry: Not applicable   PATIENT COUNSELING:    Sexuality: Discussed sexually transmitted diseases, partner selection, use of condoms, avoidance of unintended pregnancy  and contraceptive alternatives.   Advised to avoid cigarette smoking.  I discussed with the patient that most people either abstain from alcohol or drink within safe limits (<=14/week and <=4 drinks/occasion for males, <=7/weeks and <= 3 drinks/occasion for females) and that the risk for alcohol disorders and other health effects rises proportionally with the number of drinks per week and how often a drinker exceeds daily limits.  Discussed cessation/primary prevention of drug use and availability of treatment for abuse.   Diet: Encouraged to adjust caloric intake to maintain  or achieve ideal body weight, to reduce intake of dietary saturated fat and total fat, to limit sodium intake by avoiding high sodium foods  and not adding table salt, and to maintain adequate dietary potassium and calcium preferably from fresh fruits, vegetables, and low-fat dairy products.    Stressed the importance of regular exercise  Injury prevention: Discussed safety belts, safety helmets, smoke detector, smoking near bedding or upholstery.   Dental health: Discussed importance of regular tooth brushing, flossing, and dental visits.   Follow up plan: NEXT PREVENTATIVE PHYSICAL DUE IN 1 YEAR. Return in about 6 months (around 07/16/2023) for HTN/HLD, ASTHMA -- need spirometry.

## 2023-01-13 NOTE — Assessment & Plan Note (Signed)
Chronic, ongoing.  Continue current medication regimen and adjust as needed. Lipid panel today.  Return in 6 months, refills sent in.

## 2023-01-13 NOTE — Assessment & Plan Note (Signed)
Chronic, stable.  BP at goal in office today.  Continue Lisinopril daily.  Recommend he monitor BP at least a few mornings a week at home and document.  DASH diet at home.  Continue current medication regimen and adjust as needed.  Labs: CBC, CMP, TSH, urine ALB.  Return in 6 months.  Refills sent in.

## 2023-01-13 NOTE — Assessment & Plan Note (Signed)
Noted on labs, initially in 2020 with WBC 2.5, recent was 3.8 (normal).  Check CBC today.  No B symptoms.  Consider hematology referral if ongoing -- discussed at length with him today.

## 2023-01-14 ENCOUNTER — Telehealth: Payer: Self-pay

## 2023-01-14 ENCOUNTER — Other Ambulatory Visit: Payer: Self-pay | Admitting: Nurse Practitioner

## 2023-01-14 DIAGNOSIS — R7989 Other specified abnormal findings of blood chemistry: Secondary | ICD-10-CM

## 2023-01-14 DIAGNOSIS — D709 Neutropenia, unspecified: Secondary | ICD-10-CM

## 2023-01-14 LAB — LIPID PANEL W/O CHOL/HDL RATIO
Cholesterol, Total: 139 mg/dL (ref 100–199)
HDL: 64 mg/dL (ref >39)
LDL Chol Calc (NIH): 65 mg/dL (ref 0–99)
Triglycerides: 41 mg/dL (ref 0–149)
VLDL Cholesterol Cal: 10 mg/dL (ref 5–40)

## 2023-01-14 LAB — CBC WITH DIFFERENTIAL/PLATELET
Basophils Absolute: 0 10*3/uL (ref 0.0–0.2)
Basos: 1 %
EOS (ABSOLUTE): 0.5 10*3/uL — ABNORMAL HIGH (ref 0.0–0.4)
Eos: 16 %
Hematocrit: 42.4 % (ref 37.5–51.0)
Hemoglobin: 13.6 g/dL (ref 13.0–17.7)
Immature Grans (Abs): 0 10*3/uL (ref 0.0–0.1)
Immature Granulocytes: 0 %
Lymphocytes Absolute: 1.3 10*3/uL (ref 0.7–3.1)
Lymphs: 38 %
MCH: 26.6 pg (ref 26.6–33.0)
MCHC: 32.1 g/dL (ref 31.5–35.7)
MCV: 83 fL (ref 79–97)
Monocytes Absolute: 0.4 10*3/uL (ref 0.1–0.9)
Monocytes: 11 %
Neutrophils Absolute: 1.2 10*3/uL — ABNORMAL LOW (ref 1.4–7.0)
Neutrophils: 34 %
Platelets: 269 10*3/uL (ref 150–450)
RBC: 5.11 x10E6/uL (ref 4.14–5.80)
RDW: 13.2 % (ref 11.6–15.4)
WBC: 3.3 10*3/uL — ABNORMAL LOW (ref 3.4–10.8)

## 2023-01-14 LAB — MICROALBUMIN, URINE WAIVED
Creatinine, Urine Waived: 50 mg/dL (ref 10–300)
Microalb, Ur Waived: 30 mg/L — ABNORMAL HIGH (ref 0–19)

## 2023-01-14 LAB — COMPREHENSIVE METABOLIC PANEL
ALT: 54 IU/L — ABNORMAL HIGH (ref 0–44)
AST: 42 IU/L — ABNORMAL HIGH (ref 0–40)
Albumin/Globulin Ratio: 1.5 (ref 1.2–2.2)
Albumin: 4.3 g/dL (ref 3.9–4.9)
Alkaline Phosphatase: 76 IU/L (ref 44–121)
BUN/Creatinine Ratio: 17 (ref 10–24)
BUN: 18 mg/dL (ref 8–27)
Bilirubin Total: 0.4 mg/dL (ref 0.0–1.2)
CO2: 22 mmol/L (ref 20–29)
Calcium: 9.2 mg/dL (ref 8.6–10.2)
Chloride: 102 mmol/L (ref 96–106)
Creatinine, Ser: 1.04 mg/dL (ref 0.76–1.27)
Globulin, Total: 2.8 g/dL (ref 1.5–4.5)
Glucose: 79 mg/dL (ref 70–99)
Potassium: 4.6 mmol/L (ref 3.5–5.2)
Sodium: 138 mmol/L (ref 134–144)
Total Protein: 7.1 g/dL (ref 6.0–8.5)
eGFR: 79 mL/min/{1.73_m2} (ref >59)

## 2023-01-14 LAB — PSA: Prostate Specific Ag, Serum: 2.9 ng/mL (ref 0.0–4.0)

## 2023-01-14 LAB — TSH: TSH: 1.61 u[IU]/mL (ref 0.450–4.500)

## 2023-01-14 NOTE — Progress Notes (Signed)
Good afternoon, please let David Quinn know his labs have returned: - CBC shows mild low white blood cell count again and neutrophils.  I would like to recheck this in 4 weeks on outpatient labs -- please schedule lab only visit for 4 weeks. - Kidney function is normal, but liver function mildly elevated.  Please reduce any Tylenol or alcohol use if present and we will recheck this in 4 weeks as well.- - Remainder of labs are stable.  No medication changes needed.  Any questions? Keep being amazing!!  Thank you for allowing me to participate in your care.  I appreciate you. Kindest regards, Margarita Bobrowski

## 2023-01-14 NOTE — Telephone Encounter (Signed)
Pt given lab results per notes of Jolene, NP on 01/14/23. Pt verbalized understanding. Scheduled for lab FU 02/13/23.  Marjie Skiff, NP 01/14/2023  2:22 PM EDT     Good afternoon, please let Elisandro know his labs have returned: - CBC shows mild low white blood cell count again and neutrophils.  I would like to recheck this in 4 weeks on outpatient labs -- please schedule lab only visit for 4 weeks. - Kidney function is normal, but liver function mildly elevated.  Please reduce any Tylenol or alcohol use if present and we will recheck this in 4 weeks as well.- - Remainder of labs are stable.  No medication changes needed.  Any questions? Keep being amazing!!  Thank you for allowing me to participate in your care.  I appreciate you. Kindest regards, Jolene

## 2023-01-15 ENCOUNTER — Telehealth: Payer: Self-pay | Admitting: Nurse Practitioner

## 2023-01-15 NOTE — Telephone Encounter (Signed)
Patient states that pharmacy did not receive rx for rosuvastatin (CRESTOR) 10 MG tablet  on 5/20. Please resend to  [id number] - [place], [place] - [address] Phone: [phone]  Fax: [phone]

## 2023-01-15 NOTE — Telephone Encounter (Signed)
[facility] called and spoke to [facility] about the refill(s) rosuvastatin requested. Advised it was sent on 01/13/23 #90/4 refill(s). She says it's on file. She says she ran it and insurance will pay, so she will refill.

## 2023-01-15 NOTE — Telephone Encounter (Signed)
Pt given lab results per notes of J. Cannady NP on 01/15/23. Pt verbalized understanding.Pt has been taking Coricidin HBP which has acetaminophen. Before that he was taking Alka Selltzer cold. Advised taking allergy medication OTC advised to discuss with pharmacist to make sure there is no Acetaminophen.  Pt wanting to know why his WBC count is so low. Advised with send message to PCP nurse. Lab appt previously scheduled for June.

## 2023-01-16 ENCOUNTER — Telehealth: Payer: Self-pay

## 2023-01-16 NOTE — Telephone Encounter (Signed)
Per pt he want to schedule a colonoscopy. He was last seen in 2013 . PT stated PCP  sent over referral. Per pt if you call and don't get him you can send a text message if possible.

## 2023-01-16 NOTE — Telephone Encounter (Signed)
Returned patient's call with no answer and left message for patient to return call

## 2023-01-17 NOTE — Telephone Encounter (Signed)
Message left for patient to return my call.  This is 2nd attempt

## 2023-01-23 ENCOUNTER — Other Ambulatory Visit: Payer: Self-pay | Admitting: *Deleted

## 2023-01-23 ENCOUNTER — Telehealth: Payer: Self-pay | Admitting: *Deleted

## 2023-01-23 DIAGNOSIS — Z1211 Encounter for screening for malignant neoplasm of colon: Secondary | ICD-10-CM

## 2023-01-23 MED ORDER — PEG 3350-KCL-NABCB-NACL-NASULF 236 G PO SOLR: 4000.0000 mL | Freq: Once | ORAL | 0 refills | Status: AC

## 2023-01-23 NOTE — Telephone Encounter (Signed)
Gastroenterology Pre-Procedure Review  Request Date: 02/05/2023 Requesting Physician: Dr. Servando Snare  PATIENT REVIEW QUESTIONS: The patient responded to the following health history questions as indicated:    1. Are you having any GI issues? no 2. Do you have a personal history of Polyps? no 3. Do you have a family history of Colon Cancer or Polyps? no 4. Diabetes Mellitus? no 5. Joint replacements in the past 12 months?no 6. Major health problems in the past 3 months?no 7. Any artificial heart valves, MVP, or defibrillator?no    MEDICATIONS & ALLERGIES:    Patient reports the following regarding taking any anticoagulation/antiplatelet therapy:   Plavix, Coumadin, Eliquis, Xarelto, Lovenox, Pradaxa, Brilinta, or Effient? no Aspirin? yes (81 mg)  Patient confirms/reports the following medications:  Current Outpatient Medications  Medication Sig Dispense Refill   polyethylene glycol (GOLYTELY) 236 g solution Take 4,000 mLs by mouth once for 1 dose. 4000 mL 0   albuterol (PROAIR HFA) 108 (90 Base) MCG/ACT inhaler Inhale 2 puffs into the lungs every 6 (six) hours as needed for wheezing or shortness of breath. 18 g 4   fluticasone furoate-vilanterol (BREO ELLIPTA) 100-25 MCG/ACT AEPB Inhale 1 puff into the lungs daily. 60 each 4   lisinopril (ZESTRIL) 10 MG tablet TAKE 1 TABLET(10 MG) BY MOUTH DAILY 90 tablet 4   rosuvastatin (CRESTOR) 10 MG tablet Take 1 tablet (10 mg total) by mouth daily. 90 tablet 4   No current facility-administered medications for this visit.    Patient confirms/reports the following allergies:  No Known Allergies  No orders of the defined types were placed in this encounter.   AUTHORIZATION INFORMATION Primary Insurance: 1D#: Group #:  Secondary Insurance: 1D#: Group #:  SCHEDULE INFORMATION: Date: 02/05/2023 Time: Location:  [facility]

## 2023-01-31 ENCOUNTER — Encounter: Payer: Self-pay | Admitting: Gastroenterology

## 2023-02-05 ENCOUNTER — Encounter: Payer: Self-pay | Admitting: Gastroenterology

## 2023-02-05 ENCOUNTER — Ambulatory Visit: Payer: Medicare HMO | Admitting: Registered Nurse

## 2023-02-05 ENCOUNTER — Encounter
Admission: RE | Disposition: A | Discharge status: Home / Self Care | Payer: Self-pay | Source: Home / Self Care | Attending: Gastroenterology

## 2023-02-05 ENCOUNTER — Other Ambulatory Visit: Payer: Self-pay

## 2023-02-05 ENCOUNTER — Ambulatory Visit
Admission: RE | Admit: 2023-02-05 | Discharge: 2023-02-05 | Disposition: A | Discharge status: Home / Self Care | Payer: Medicare HMO | Attending: Gastroenterology | Admitting: Gastroenterology

## 2023-02-05 DIAGNOSIS — M1990 Unspecified osteoarthritis, unspecified site: Secondary | ICD-10-CM | POA: Diagnosis not present

## 2023-02-05 DIAGNOSIS — J45909 Unspecified asthma, uncomplicated: Secondary | ICD-10-CM | POA: Insufficient documentation

## 2023-02-05 DIAGNOSIS — Z1211 Encounter for screening for malignant neoplasm of colon: Secondary | ICD-10-CM

## 2023-02-05 DIAGNOSIS — I10 Essential (primary) hypertension: Secondary | ICD-10-CM | POA: Diagnosis not present

## 2023-02-05 DIAGNOSIS — K640 First degree hemorrhoids: Secondary | ICD-10-CM | POA: Diagnosis not present

## 2023-02-05 HISTORY — DX: Unspecified osteoarthritis, unspecified site: M19.90

## 2023-02-05 HISTORY — PX: COLONOSCOPY WITH PROPOFOL: SHX5780

## 2023-02-05 SURGERY — COLONOSCOPY WITH PROPOFOL
Anesthesia: General

## 2023-02-05 MED ORDER — PROPOFOL 10 MG/ML IV BOLUS
INTRAVENOUS | Status: DC | PRN
  Administered 2023-02-05: 90 mg via INTRAVENOUS

## 2023-02-05 MED ORDER — LIDOCAINE HCL (CARDIAC) PF 100 MG/5ML IV SOSY
PREFILLED_SYRINGE | INTRAVENOUS | Status: DC | PRN
  Administered 2023-02-05: 40 mg via INTRAVENOUS

## 2023-02-05 MED ORDER — PROPOFOL 10 MG/ML IV BOLUS
INTRAVENOUS | Status: AC
  Filled 2023-02-05: qty 20

## 2023-02-05 MED ORDER — SODIUM CHLORIDE 0.9 % IV SOLN: INTRAVENOUS | Status: DC

## 2023-02-05 MED ORDER — PROPOFOL 500 MG/50ML IV EMUL
INTRAVENOUS | Status: DC | PRN
  Administered 2023-02-05: 150 ug/kg/min via INTRAVENOUS

## 2023-02-05 NOTE — Transfer of Care (Signed)
Immediate Anesthesia Transfer of Care Note  Patient: David Quinn  Procedure(s) Performed: Procedure(s) with comments: COLONOSCOPY WITH PROPOFOL (N/A) - REQUESTS ABOUT 10 AM ARRIVAL  Patient Location: PACU and Endoscopy Unit  Anesthesia Type:General  Level of Consciousness: sedated  Airway & Oxygen Therapy: Patient Spontanous Breathing and Patient connected to nasal cannula oxygen  Post-op Assessment: Report given to RN and Post -op Vital signs reviewed and stable  Post vital signs: Reviewed and stable  Last Vitals:  Vitals:   02/05/23 1016  BP: (!) 142/92  Pulse: 93  Resp: 18  Temp: (!) 36.2 C  SpO2: 100%    Complications: No apparent anesthesia complications

## 2023-02-05 NOTE — H&P (Signed)
Midge Minium, MD [facility] [address] [place], [place] [address] Phone: [phone] Fax : [phone]  Primary Care Physician:  Marjie Skiff, NP Primary Gastroenterologist:  Dr. Servando Snare  Pre-Procedure History & Physical: HPI:  David Quinn is a 67 y.o. male is here for a screening colonoscopy.   Past Medical History:  Diagnosis Date   Arthritis    Asthma    Hypertension     Past Surgical History:  Procedure Laterality Date   JOINT REPLACEMENT Bilateral    hip replacement   LUMBAR LAMINECTOMY/DECOMPRESSION MICRODISCECTOMY Left 07/29/2020   Procedure: LUMBAR LAMINECTOMY/DECOMPRESSION MICRODISCECTOMY 2 LEVELS;  Surgeon: Venetia Night, MD;  Location: [facility] ORS;  Service: Neurosurgery;  Laterality: Left;   TRACHEOSTOMY      Prior to Admission medications   Medication Sig Start Date End Date Taking? Authorizing Provider  amLODipine (NORVASC) 5 MG tablet Take 5 mg by mouth daily.   Yes [provider]  aspirin EC 81 MG tablet Take 81 mg by mouth daily. Swallow whole.   Yes [provider]  naproxen (NAPRELAN) 500 MG 24 hr tablet Take 500 mg by mouth daily with breakfast.   Yes [provider]  naproxen sodium (ANAPROX) 275 MG tablet Take 275 mg by mouth 2 (two) times daily with a meal.   Yes [provider]  oxyCODONE (ROXICODONE) 15 MG immediate release tablet Take 15 mg by mouth every 4 (four) hours as needed for pain.   Yes [provider]  albuterol (PROAIR HFA) 108 (90 Base) MCG/ACT inhaler Inhale 2 puffs into the lungs every 6 (six) hours as needed for wheezing or shortness of breath. 01/13/23   Cannady, Jolene T, NP  fluticasone furoate-vilanterol (BREO ELLIPTA) 100-25 MCG/ACT AEPB Inhale 1 puff into the lungs daily. 01/13/23   Cannady, Corrie Dandy T, NP  lisinopril (ZESTRIL) 10 MG tablet TAKE 1 TABLET(10 MG) BY MOUTH DAILY 01/13/23   Cannady, Corrie Dandy T, NP  rosuvastatin (CRESTOR) 10 MG tablet Take 1 tablet (10 mg total)  by mouth daily. 01/13/23   Aura Dials T, NP    Allergies as of 01/23/2023   (No Known Allergies)    Family History  Problem Relation Age of Onset   Asthma Mother    Cancer Mother    Hyperlipidemia Mother    Hypertension Mother    Hypertension Father    Stroke Father    Cancer Brother        stomach   Seizures Brother     Social History   Socioeconomic History   Marital status: Married    Spouse name: Not on file   Number of children: Not on file   Years of education: Not on file   Highest education level: Not on file  Occupational History   Not on file  Tobacco Use   Smoking status: Never   Smokeless tobacco: Never  Vaping Use   Vaping Use: Never used  Substance and Sexual Activity   Alcohol use: No    Alcohol/week: 0.0 standard drinks of alcohol   Drug use: No   Sexual activity: Yes  Other Topics Concern   Not on file  Social History Narrative   Not on file   Social Determinants of Health   Financial Resource Strain: Low Risk  (01/13/2023)   Overall Financial Resource Strain (CARDIA)    Difficulty of Paying Living Expenses: Not hard at all  Food Insecurity: No Food Insecurity (01/13/2023)   Hunger Vital Sign    Worried  About Running Out of Food in the Last Year: Never true    Ran Out of Food in the Last Year: Never true  Transportation Needs: No Transportation Needs (01/13/2023)   PRAPARE - Administrator, Civil Service (Medical): No    Lack of Transportation (Non-Medical): No  Physical Activity: Insufficiently Active (01/13/2023)   Exercise Vital Sign    Days of Exercise per Week: 2 days    Minutes of Exercise per Session: 20 min  Stress: No Stress Concern Present (01/13/2023)   [employer] of Occupational Health - Occupational Stress Questionnaire    Feeling of Stress : Not at all  Social Connections: Socially Integrated (01/13/2023)   Social Connection and Isolation Panel [NHANES]    Frequency of Communication with Friends and  Family: More than three times a week    Frequency of Social Gatherings with Friends and Family: More than three times a week    Attends Religious Services: More than 4 times per year    Active Member of [employer] or Organizations: Yes    Attends Banker Meetings: Never    Marital Status: Married  Catering manager Violence: Not At Risk (01/13/2023)   Humiliation, Afraid, Rape, and Kick questionnaire    Fear of Current or Ex-Partner: No    Emotionally Abused: No    Physically Abused: No    Sexually Abused: No    Review of Systems: See HPI, otherwise negative ROS  Physical Exam: There were no vitals taken for this visit. General:   Alert,  pleasant and cooperative in NAD Head:  Normocephalic and atraumatic. Neck:  Supple; no masses or thyromegaly. Lungs:  Clear throughout to auscultation.    Heart:  Regular rate and rhythm. Abdomen:  Soft, nontender and nondistended. Normal bowel sounds, without guarding, and without rebound.   Neurologic:  Alert and  oriented x4;  grossly normal neurologically.  Impression/Plan: David Quinn is now here to undergo a screening colonoscopy.  Risks, benefits, and alternatives regarding colonoscopy have been reviewed with the patient.  Questions have been answered.  All parties agreeable.

## 2023-02-05 NOTE — Anesthesia Postprocedure Evaluation (Signed)
Anesthesia Post Note  Patient: David Quinn  Procedure(s) Performed: COLONOSCOPY WITH PROPOFOL  Patient location during evaluation: PACU Anesthesia Type: General Level of consciousness: awake and alert, oriented and patient cooperative Pain management: pain level controlled Vital Signs Assessment: post-procedure vital signs reviewed and stable Respiratory status: spontaneous breathing, nonlabored ventilation and respiratory function stable Cardiovascular status: blood pressure returned to baseline and stable Postop Assessment: adequate PO intake Anesthetic complications: no   No notable events documented.   Last Vitals:  Vitals:   02/05/23 1057 02/05/23 1107  BP: (!) 121/92 119/89  Pulse:    Resp:    Temp:    SpO2:      Last Pain:  Vitals:   02/05/23 1107  TempSrc:   PainSc: 0-No pain                 Reed Breech

## 2023-02-05 NOTE — Anesthesia Preprocedure Evaluation (Addendum)
Anesthesia Evaluation  Patient identified by MRN, date of birth, ID band Patient awake    Reviewed: Allergy & Precautions, NPO status , Patient's Chart, lab work & pertinent test results  History of Anesthesia Complications Negative for: history of anesthetic complications  Airway Mallampati: III   Neck ROM: Full    Dental  (+) Upper Dentures   Pulmonary asthma    Pulmonary exam normal breath sounds clear to auscultation       Cardiovascular hypertension, Normal cardiovascular exam Rhythm:Regular Rate:Normal     Neuro/Psych negative neurological ROS     GI/Hepatic negative GI ROS,,,  Endo/Other  negative endocrine ROS    Renal/GU negative Renal ROS   BPH    Musculoskeletal  (+) Arthritis ,    Abdominal   Peds  Hematology negative hematology ROS (+)   Anesthesia Other Findings   Reproductive/Obstetrics                             Anesthesia Physical Anesthesia Plan  ASA: 2  Anesthesia Plan: General   Post-op Pain Management:    Induction: Intravenous  PONV Risk Score and Plan: 2 and Propofol infusion, TIVA and Treatment may vary due to age or medical condition  Airway Management Planned: Natural Airway  Additional Equipment:   Intra-op Plan:   Post-operative Plan:   Informed Consent: I have reviewed the patients History and Physical, chart, labs and discussed the procedure including the risks, benefits and alternatives for the proposed anesthesia with the patient or authorized representative who has indicated his/her understanding and acceptance.       Plan Discussed with: CRNA  Anesthesia Plan Comments: (LMA/GETA backup discussed.  Patient consented for risks of anesthesia including but not limited to:  - adverse reactions to medications - damage to eyes, teeth, lips or other oral mucosa - nerve damage due to positioning  - sore throat or hoarseness - damage to  heart, brain, nerves, lungs, other parts of body or loss of life  Informed patient about role of CRNA in peri- and intra-operative care.  Patient voiced understanding.)       Anesthesia Quick Evaluation

## 2023-02-05 NOTE — Op Note (Signed)
[facility] Patient Name: David Quinn Procedure Date: 02/05/2023 10:15 AM MRN: [id number] Account #: [id number] Date of Birth: 02-20-1956 Admit Type: Outpatient Age: 67 Room: [facility] ENDO ROOM 1 Gender: Male Note Status: Finalized Instrument Name: Nelda Marseille [id number] Procedure:             Colonoscopy Indications:           Screening for colorectal malignant neoplasm Providers:             Midge Minium MD, MD Referring MD:          Dorie Rank. Cannady (Referring MD) Medicines:             Propofol per Anesthesia Complications:         No immediate complications. Procedure:             Pre-Anesthesia Assessment:                        - Prior to the procedure, a History and Physical was                         performed, and patient medications and allergies were                         reviewed. The patient's tolerance of previous                         anesthesia was also reviewed. The risks and benefits                         of the procedure and the sedation options and risks                         were discussed with the patient. All questions were                         answered, and informed consent was obtained. Prior                         Anticoagulants: The patient has taken no anticoagulant                         or antiplatelet agents. ASA Grade Assessment: II - A                         patient with mild systemic disease. After reviewing                         the risks and benefits, the patient was deemed in                         satisfactory condition to undergo the procedure.                        After obtaining informed consent, the colonoscope was                         passed under direct vision. Throughout the procedure,  the patient's blood pressure, pulse, and oxygen                         saturations were monitored continuously. The                         Colonoscope was introduced through  the anus and                         advanced to the the cecum, identified by appendiceal                         orifice and ileocecal valve. The colonoscopy was                         performed without difficulty. The patient tolerated                         the procedure well. The quality of the bowel                         preparation was excellent. Findings:      The perianal and digital rectal examinations were normal.      Non-bleeding internal hemorrhoids were found during retroflexion. The       hemorrhoids were Grade I (internal hemorrhoids that do not prolapse). Impression:            - Non-bleeding internal hemorrhoids.                        - No specimens collected. Recommendation:        - Discharge patient to home.                        - Resume previous diet.                        - Continue present medications.                        - Repeat colonoscopy in 10 years for screening                         purposes. Procedure Code(s):     --- Professional ---                        [phone], Colonoscopy, flexible; diagnostic, including                         collection of specimen(s) by brushing or washing, when                         performed (separate procedure) Diagnosis Code(s):     --- Professional ---                        Z12.11, Encounter for screening for malignant neoplasm                         of colon CPT copyright 2022 American Medical Association. All rights reserved. The  codes documented in this report are preliminary and upon coder review may  be revised to meet current compliance requirements. Midge Minium MD, MD 02/05/2023 10:42:15 AM This report has been signed electronically. Number of Addenda: 0 Note Initiated On: 02/05/2023 10:15 AM Scope Withdrawal Time: 0 hours 6 minutes 15 seconds  Total Procedure Duration: 0 hours 10 minutes 45 seconds  Estimated Blood Loss:  Estimated blood loss: none.      [facility]

## 2023-02-05 NOTE — Anesthesia Procedure Notes (Signed)
Date/Time: 02/05/2023 10:30 AM  Performed by: Stormy Fabian, CRNAPre-anesthesia Checklist: Patient identified, Emergency Drugs available, Suction available and Patient being monitored Patient Re-evaluated:Patient Re-evaluated prior to induction Oxygen Delivery Method: Nasal cannula Induction Type: IV induction Dental Injury: Teeth and Oropharynx as per pre-operative assessment  Comments: Nasal cannula with etCO2 monitoring

## 2023-02-06 ENCOUNTER — Encounter: Payer: Self-pay | Admitting: Gastroenterology

## 2023-02-13 ENCOUNTER — Other Ambulatory Visit: Payer: Medicare HMO

## 2023-02-14 ENCOUNTER — Other Ambulatory Visit: Payer: Medicare HMO

## 2023-02-14 DIAGNOSIS — R7989 Other specified abnormal findings of blood chemistry: Secondary | ICD-10-CM | POA: Diagnosis not present

## 2023-02-14 DIAGNOSIS — D709 Neutropenia, unspecified: Secondary | ICD-10-CM | POA: Diagnosis not present

## 2023-02-15 LAB — CBC WITH DIFFERENTIAL/PLATELET
Basophils Absolute: 0 10*3/uL (ref 0.0–0.2)
Basos: 1 %
EOS (ABSOLUTE): 0.6 10*3/uL — ABNORMAL HIGH (ref 0.0–0.4)
Eos: 14 %
Hematocrit: 42.3 % (ref 37.5–51.0)
Hemoglobin: 13.3 g/dL (ref 13.0–17.7)
Immature Grans (Abs): 0 10*3/uL (ref 0.0–0.1)
Immature Granulocytes: 0 %
Lymphocytes Absolute: 1.2 10*3/uL (ref 0.7–3.1)
Lymphs: 30 %
MCH: 26.1 pg — ABNORMAL LOW (ref 26.6–33.0)
MCHC: 31.4 g/dL — ABNORMAL LOW (ref 31.5–35.7)
MCV: 83 fL (ref 79–97)
Monocytes Absolute: 0.4 10*3/uL (ref 0.1–0.9)
Monocytes: 10 %
Neutrophils Absolute: 1.8 10*3/uL (ref 1.4–7.0)
Neutrophils: 45 %
Platelets: 265 10*3/uL (ref 150–450)
RBC: 5.1 x10E6/uL (ref 4.14–5.80)
RDW: 12.8 % (ref 11.6–15.4)
WBC: 4 10*3/uL (ref 3.4–10.8)

## 2023-02-15 LAB — HEPATIC FUNCTION PANEL
ALT: 59 IU/L — ABNORMAL HIGH (ref 0–44)
AST: 44 IU/L — ABNORMAL HIGH (ref 0–40)
Albumin: 4.1 g/dL (ref 3.9–4.9)
Alkaline Phosphatase: 76 IU/L (ref 44–121)
Bilirubin Total: 0.2 mg/dL (ref 0.0–1.2)
Bilirubin, Direct: 0.11 mg/dL (ref 0.00–0.40)
Total Protein: 6.8 g/dL (ref 6.0–8.5)

## 2023-02-16 NOTE — Progress Notes (Signed)
Good morning, please let Sekai know his labs have returned and CBC shows white blood cell count in normal range at this time.  Neutrophils also normal.  Liver function tests still are mildly elevated.  Any alcohol or Tylenol intake at home?  I would like to recheck these at your visit in November and if ongoing elevations we may consider an ultrasound of the liver to further check.  Any questions? Keep being amazing!!  Thank you for allowing me to participate in your care.  I appreciate you. Kindest regards, Mykael Trott

## 2023-07-16 ENCOUNTER — Ambulatory Visit (INDEPENDENT_AMBULATORY_CARE_PROVIDER_SITE_OTHER): Payer: Medicare HMO | Admitting: Nurse Practitioner

## 2023-07-16 ENCOUNTER — Encounter: Payer: Self-pay | Admitting: Nurse Practitioner

## 2023-07-16 VITALS — BP 125/83 | HR 71 | Temp 97.9°F | Wt 166.0 lb

## 2023-07-16 DIAGNOSIS — D709 Neutropenia, unspecified: Secondary | ICD-10-CM

## 2023-07-16 DIAGNOSIS — J4540 Moderate persistent asthma, uncomplicated: Secondary | ICD-10-CM

## 2023-07-16 DIAGNOSIS — E7800 Pure hypercholesterolemia, unspecified: Secondary | ICD-10-CM | POA: Diagnosis not present

## 2023-07-16 DIAGNOSIS — I10 Essential (primary) hypertension: Secondary | ICD-10-CM

## 2023-07-16 NOTE — Assessment & Plan Note (Signed)
Chronic, stable.  BP at goal in office today.  Continue Lisinopril daily.  Recommend he monitor BP at least a few mornings a week at home and document.  DASH diet at home.  Continue current medication regimen and adjust as needed.  Labs: CMP, CBC.  Return in 6 months.

## 2023-07-16 NOTE — Assessment & Plan Note (Signed)
Chronic, ongoing.  Continue current medication regimen and adjust as needed.  Lipid panel today.  Return in 6 months. 

## 2023-07-16 NOTE — Assessment & Plan Note (Signed)
Noted on labs, initially in 2020 with WBC 2.5, recent was 4.0 (normal).  Check CBC today.  No B symptoms.  Consider hematology referral if ongoing -- discussed at length with him today.

## 2023-07-16 NOTE — Patient Instructions (Addendum)
David Quinn, Symbicort, Advair, Airduo - BREO currently on board and these are in same family  Asthma, Adult  Asthma is a condition that causes swelling and narrowing of the airways. These are the passages that lead from the nose and mouth down into the lungs. When asthma symptoms get worse it is called an asthma attack or flare. This can make it hard to breathe. Asthma flares can range from minor to life-threatening. There is no cure for asthma, but medicines and lifestyle changes can help to control it. What are the causes? It is not known exactly what causes asthma, but certain things can cause asthma symptoms to get worse (triggers). What can trigger an asthma attack? Cigarette smoke. Mold. Dust. Your pet's skin flakes (dander). Cockroaches. Pollen. Air pollution (like household cleaners, wood smoke, smog, or Therapist, occupational). What are the signs or symptoms? Trouble breathing (shortness of breath). Coughing. Making high-pitched whistling sounds when you breathe, most often when you breathe out (wheezing). Chest tightness. Tiredness with little activity. Poor exercise tolerance. How is this treated? Controller medicines that help prevent asthma symptoms. Fast-acting reliever or rescue medicines. These give short-term relief of asthma symptoms. Allergy medicines if your attacks are brought on by allergens. Medicines to help control the body's defense (immune) system. Staying away from the things that cause asthma attacks. Follow these instructions at home: Avoiding triggers in your home Do not allow anyone to smoke in your home. Limit use of fireplaces and wood stoves. Get rid of pests (such as roaches and mice) and their droppings. Keep your home clean. Clean your floors. Dust regularly. Use cleaning products that do not smell. Wash bed sheets and blankets every week in hot water. Dry them in a dryer. Have someone vacuum when you are not home. Change your heating and air  conditioning filters often. Use blankets that are made of polyester or cotton. General instructions Take over-the-counter and prescription medicines only as told by your doctor. Do not smoke or use any products that contain nicotine or tobacco. If you need help quitting, ask your doctor. Stay away from secondhand smoke. Avoid doing things outdoors when allergen counts are high and when air quality is low. Warm up before you exercise. Take time to cool down after exercise. Use a peak flow meter as told by your doctor. A peak flow meter is a tool that measures how well your lungs are working. Keep track of the peak flow meter's readings. Write them down. Follow your asthma action plan. This is a written plan for taking care of your asthma and treating your attacks. Make sure you get all the shots (vaccines) that your doctor recommends. Ask your doctor about a flu shot and a pneumonia shot. Keep all follow-up visits. Contact a doctor if: You have wheezing, shortness of breath, or a cough even while taking medicine to prevent attacks. The mucus you cough up (sputum) is thicker than usual. The mucus you cough up changes from clear or white to yellow, green, gray, or is bloody. You have problems from the medicine you are taking, such as: A rash. Itching. Swelling. Trouble breathing. You need reliever medicines more than 2-3 times a week. Your peak flow reading is still at 50-79% of your personal best after following the action plan for 1 hour. You have a fever. Get help right away if: You seem to be worse and are not responding to medicine during an asthma attack. You are short of breath even at rest. You get short  of breath when doing very little activity. You have trouble eating, drinking, or talking. You have chest pain or tightness. You have a fast heartbeat. Your lips or fingernails start to turn blue. You are light-headed or dizzy, or you faint. Your peak flow is less than 50% of  your personal best. You feel too tired to breathe normally. These symptoms may be an emergency. Get help right away. Call 911. Do not wait to see if the symptoms will go away. Do not drive yourself to the hospital. Summary Asthma is a long-term (chronic) condition in which the airways get tight and narrow. An asthma attack can make it hard to breathe. Asthma cannot be cured, but medicines and lifestyle changes can help control it. Make sure you understand how to avoid triggers and how and when to use your medicines. Avoid things that can cause allergy symptoms (allergens). These include animal skin flakes (dander) and pollen from trees or grass. Avoid things that pollute the air. These may include household cleaners, wood smoke, smog, or chemical odors. This information is not intended to replace advice given to you by your health care provider. Make sure you discuss any questions you have with your health care provider. Document Revised: 05/21/2021 Document Reviewed: 05/21/2021 Elsevier Patient Education  2024 [employer].

## 2023-07-16 NOTE — Progress Notes (Signed)
BP 125/83   Pulse 71   Temp 97.9 F (36.6 C) (Oral)   Wt 166 lb (75.3 kg)   SpO2 98%   BMI 26.00 kg/m    Subjective:    Patient ID: David Quinn, male    DOB: [birth year], 67 y.o.   MRN: [id number]  HPI: David Quinn is a 67 y.o. male  Chief Complaint  Patient presents with   Asthma   Hyperlipidemia   Hypertension   HYPERTENSION / HYPERLIPIDEMIA Taking Lisinopril and Rosuvastatin daily. Satisfied with current treatment? yes Duration of hypertension: chronic BP monitoring frequency: not checking BP range: not checking BP medication side effects: no Duration of hyperlipidemia: chronic Aspirin: no Recent stressors: no Recurrent headaches: no Visual changes: no Palpitations: no Dyspnea: no Chest pain: no Lower extremity edema: no Dizzy/lightheaded: no  The 10-year ASCVD risk score (Arnett DK, et al., 2019) is: 13.9%   Values used to calculate the score:     Age: 51 years     Sex: Male     Is Non-Hispanic African American: Yes     Diabetic: No     Tobacco smoker: No     Systolic Blood Pressure: 125 mmHg     Is BP treated: Yes     HDL Cholesterol: 64 mg/dL     Total Cholesterol: 139 mg/dL  ASTHMA Using Breo and Albuterol.  Is switching insurance and will need different long acting in new year.  Has history of low WBC ==  last May 2024 was 4.0.  No B symptoms.  Initially presented, in 2020 with WBC 2.5.   Asthma status: stable Satisfied with current treatment?: yes Albuterol/rescue inhaler frequency: occasional with wheezing Dyspnea frequency: none Wheezing frequency: none Cough frequency:  none Nocturnal symptom frequency:  Limitation of activity: no Current upper respiratory symptoms: no Triggers: weather changes Last Spirometry: unknown Failed/intolerant to following asthma meds: unknown Aerochamber/spacer use: no Visits to ER or Urgent Care in past year: no Pneumovax: refuses Influenza: refuses      07/16/2023    1:18 PM 01/13/2023     2:21 PM 05/13/2022    4:01 PM 02/08/2022    4:10 PM 12/29/2020   10:13 AM  Depression screen PHQ 2/9  Decreased Interest 0 0 0 0 0  Down, Depressed, Hopeless 0 0 0 0 0  PHQ - 2 Score 0 0 0 0 0  Altered sleeping 0 0 0 0   Tired, decreased energy 0 0 0 0   Change in appetite 0 0 0 0   Feeling bad or failure about yourself  0 0 0 0   Trouble concentrating 0 0 0 0   Moving slowly or fidgety/restless 0 0 0 0   Suicidal thoughts 0 0 0 0   PHQ-9 Score 0 0 0 0   Difficult doing work/chores Not difficult at all Not difficult at all Not difficult at all Not difficult at all        07/16/2023    1:19 PM 01/13/2023    2:21 PM 05/13/2022    4:01 PM 02/08/2022    4:10 PM  GAD 7 : Generalized Anxiety Score  Nervous, Anxious, on Edge 0 0 0 0  Control/stop worrying 0 0 0 0  Worry too much - different things 0 0 0 0  Trouble relaxing 0 0 0 0  Restless 0 0 0 0  Easily annoyed or irritable 0 0 0 0  Afraid - awful might happen 0 0 0 0  Total GAD 7 Score 0 0 0 0  Anxiety Difficulty Not difficult at all Not difficult at all Not difficult at all    Relevant past medical, surgical, family and social history reviewed and updated as indicated. Interim medical history since our last visit reviewed. Allergies and medications reviewed and updated.  Review of Systems  Constitutional:  Negative for activity change, diaphoresis, fatigue and fever.  Respiratory:  Negative for cough, chest tightness, shortness of breath and wheezing.   Cardiovascular:  Negative for chest pain, palpitations and leg swelling.  Gastrointestinal: Negative.   Neurological: Negative.   Psychiatric/Behavioral: Negative.      Per HPI unless specifically indicated above     Objective:    BP 125/83   Pulse 71   Temp 97.9 F (36.6 C) (Oral)   Wt 166 lb (75.3 kg)   SpO2 98%   BMI 26.00 kg/m   Wt Readings from Last 3 Encounters:  07/16/23 166 lb (75.3 kg)  02/05/23 154 lb (69.9 kg)  01/13/23 161 lb (73 kg)    Physical  Exam Vitals and nursing note reviewed.  Constitutional:      General: He is awake. He is not in acute distress.    Appearance: He is well-developed and well-groomed. He is not ill-appearing or toxic-appearing.  HENT:     Head: Normocephalic and atraumatic.     Right Ear: Hearing normal. No drainage.     Left Ear: Hearing normal. No drainage.  Eyes:     General: Lids are normal.        Right eye: No discharge.        Left eye: No discharge.     Conjunctiva/sclera: Conjunctivae normal.     Pupils: Pupils are equal, round, and reactive to light.  Neck:     Thyroid: No thyromegaly.     Vascular: No carotid bruit.  Cardiovascular:     Rate and Rhythm: Normal rate and regular rhythm.     Heart sounds: Normal heart sounds, S1 normal and S2 normal. No murmur heard.    No gallop.  Pulmonary:     Effort: Pulmonary effort is normal. No accessory muscle usage or respiratory distress.     Breath sounds: Normal breath sounds.  Abdominal:     General: Bowel sounds are normal.     Palpations: Abdomen is soft.  Musculoskeletal:        General: Normal range of motion.     Cervical back: Normal range of motion and neck supple.     Right lower leg: No edema.     Left lower leg: No edema.  Lymphadenopathy:     Head:     Right side of head: No submental, submandibular, tonsillar, preauricular or posterior auricular adenopathy.     Left side of head: No submental, submandibular, tonsillar, preauricular or posterior auricular adenopathy.     Cervical: No cervical adenopathy.  Skin:    General: Skin is warm and dry.  Neurological:     Mental Status: He is alert and oriented to person, place, and time.  Psychiatric:        Mood and Affect: Mood normal.        Behavior: Behavior normal. Behavior is cooperative.        Thought Content: Thought content normal.        Judgment: Judgment normal.    Results for orders placed or performed in visit on 02/14/23  CBC with Differential/Platelet  Result  Value Ref Range  WBC 4.0 3.4 - 10.8 x10E3/uL   RBC 5.10 4.14 - 5.80 x10E6/uL   Hemoglobin 13.3 13.0 - 17.7 g/dL   Hematocrit 08.6 57.8 - 51.0 %   MCV 83 79 - 97 fL   MCH 26.1 (L) 26.6 - 33.0 pg   MCHC 31.4 (L) 31.5 - 35.7 g/dL   RDW 46.9 62.9 - 52.8 %   Platelets 265 150 - 450 x10E3/uL   Neutrophils 45 Not Estab. %   Lymphs 30 Not Estab. %   Monocytes 10 Not Estab. %   Eos 14 Not Estab. %   Basos 1 Not Estab. %   Neutrophils Absolute 1.8 1.4 - 7.0 x10E3/uL   Lymphocytes Absolute 1.2 0.7 - 3.1 x10E3/uL   Monocytes Absolute 0.4 0.1 - 0.9 x10E3/uL   EOS (ABSOLUTE) 0.6 (H) 0.0 - 0.4 x10E3/uL   Basophils Absolute 0.0 0.0 - 0.2 x10E3/uL   Immature Granulocytes 0 Not Estab. %   Immature Grans (Abs) 0.0 0.0 - 0.1 x10E3/uL  Hepatic function panel  Result Value Ref Range   Total Protein 6.8 6.0 - 8.5 g/dL   Albumin 4.1 3.9 - 4.9 g/dL   Bilirubin Total 0.2 0.0 - 1.2 mg/dL   Bilirubin, Direct 4.13 0.00 - 0.40 mg/dL   Alkaline Phosphatase 76 44 - 121 IU/L   AST 44 (H) 0 - 40 IU/L   ALT 59 (H) 0 - 44 IU/L      Assessment & Plan:   Problem List Items Addressed This Visit       Cardiovascular and Mediastinum   Hypertension    Chronic, stable.  BP at goal in office today.  Continue Lisinopril daily.  Recommend he monitor BP at least a few mornings a week at home and document.  DASH diet at home.  Continue current medication regimen and adjust as needed.  Labs: CMP, CBC.  Return in 6 months.        Relevant Orders   CBC with Differential/Platelet   Comprehensive metabolic panel     Respiratory   Asthma, moderate persistent    Chronic, ongoing. Continue Albuterol as needed and Breo maintenance.  Adjust regimen as needed based on symptoms -- will need to change long acting in new year due to insurance changes, he will alert PCP to what is covered.  Spirometry next visit.  Return in 6 months.          Other   Hyperlipidemia    Chronic, ongoing.  Continue current medication regimen  and adjust as needed. Lipid panel today.  Return in 6 months.      Relevant Orders   Comprehensive metabolic panel   Lipid Panel w/o Chol/HDL Ratio   Neutropenia (HCC) - Primary    Noted on labs, initially in 2020 with WBC 2.5, recent was 4.0 (normal).  Check CBC today.  No B symptoms.  Consider hematology referral if ongoing -- discussed at length with him today.      Relevant Orders   CBC with Differential/Platelet     Follow up plan: Return in about 6 months (around 01/13/2024) for Annual Physical.

## 2023-07-16 NOTE — Assessment & Plan Note (Signed)
Chronic, ongoing. Continue Albuterol as needed and Breo maintenance.  Adjust regimen as needed based on symptoms -- will need to change long acting in new year due to insurance changes, he will alert PCP to what is covered.  Spirometry next visit.  Return in 6 months.

## 2023-07-17 LAB — LIPID PANEL W/O CHOL/HDL RATIO
Cholesterol, Total: 163 mg/dL (ref 100–199)
HDL: 70 mg/dL (ref >39)
LDL Chol Calc (NIH): 81 mg/dL (ref 0–99)
Triglycerides: 58 mg/dL (ref 0–149)
VLDL Cholesterol Cal: 12 mg/dL (ref 5–40)

## 2023-07-17 LAB — COMPREHENSIVE METABOLIC PANEL
ALT: 37 [IU]/L (ref 0–44)
AST: 38 [IU]/L (ref 0–40)
Albumin: 4.2 g/dL (ref 3.9–4.9)
Alkaline Phosphatase: 87 [IU]/L (ref 44–121)
BUN/Creatinine Ratio: 16 (ref 10–24)
BUN: 17 mg/dL (ref 8–27)
Bilirubin Total: 0.5 mg/dL (ref 0.0–1.2)
CO2: 22 mmol/L (ref 20–29)
Calcium: 9.6 mg/dL (ref 8.6–10.2)
Chloride: 101 mmol/L (ref 96–106)
Creatinine, Ser: 1.07 mg/dL (ref 0.76–1.27)
Globulin, Total: 2.9 g/dL (ref 1.5–4.5)
Glucose: 69 mg/dL — ABNORMAL LOW (ref 70–99)
Potassium: 5.2 mmol/L (ref 3.5–5.2)
Sodium: 137 mmol/L (ref 134–144)
Total Protein: 7.1 g/dL (ref 6.0–8.5)
eGFR: 76 mL/min/{1.73_m2} (ref >59)

## 2023-07-17 LAB — CBC WITH DIFFERENTIAL/PLATELET
Basophils Absolute: 0 10*3/uL (ref 0.0–0.2)
Basos: 1 %
EOS (ABSOLUTE): 0.2 10*3/uL (ref 0.0–0.4)
Eos: 7 %
Hematocrit: 43.1 % (ref 37.5–51.0)
Hemoglobin: 13.8 g/dL (ref 13.0–17.7)
Immature Grans (Abs): 0 10*3/uL (ref 0.0–0.1)
Immature Granulocytes: 0 %
Lymphocytes Absolute: 1.1 10*3/uL (ref 0.7–3.1)
Lymphs: 35 %
MCH: 26.7 pg (ref 26.6–33.0)
MCHC: 32 g/dL (ref 31.5–35.7)
MCV: 83 fL (ref 79–97)
Monocytes Absolute: 0.4 10*3/uL (ref 0.1–0.9)
Monocytes: 11 %
Neutrophils Absolute: 1.5 10*3/uL (ref 1.4–7.0)
Neutrophils: 46 %
Platelets: 221 10*3/uL (ref 150–450)
RBC: 5.17 x10E6/uL (ref 4.14–5.80)
RDW: 13.5 % (ref 11.6–15.4)
WBC: 3.3 10*3/uL — ABNORMAL LOW (ref 3.4–10.8)

## 2023-07-17 NOTE — Progress Notes (Signed)
Good day, please let David Quinn know his labs have returned and are overall stable.  His white blood cell count has decreased a little again, but this is mild.  We will recheck next visit.  No medication changes.  Any questions? Keep being stellar!!  Thank you for allowing me to participate in your care.  I appreciate you. Kindest regards, Lemoyne Scarpati

## 2023-07-18 ENCOUNTER — Telehealth: Payer: Self-pay

## 2023-07-18 NOTE — Telephone Encounter (Signed)
Spoke with patient and informed him, that he would have to reach out to his insurance or possibly the pharmacy to received the alternative medications that would be covered. Patient verbalized understanding and says he has not further questions at this time.

## 2023-07-18 NOTE — Telephone Encounter (Signed)
Pt stated he would like a list of meds like Breo that [employer] will cover. His copay is very high because he said [employer] does not cover it.

## 2023-09-28 ENCOUNTER — Other Ambulatory Visit: Payer: Self-pay | Admitting: Nurse Practitioner

## 2023-09-30 NOTE — Telephone Encounter (Signed)
 Requested Prescriptions  Pending Prescriptions Disp Refills   albuterol  (VENTOLIN  HFA) 108 (90 Base) MCG/ACT inhaler [Pharmacy Med Name: ALBUTEROL  HFA (PROAIR ) INHALER] 18 g 0    Sig: TAKE 2 PUFFS BY MOUTH EVERY 6 HOURS AS NEEDED FOR WHEEZE OR SHORTNESS OF BREATH     Pulmonology:  Beta Agonists 2 Passed - 09/30/2023  8:46 AM      Passed - Last BP in normal range    BP Readings from Last 1 Encounters:  07/16/23 125/83         Passed - Last Heart Rate in normal range    Pulse Readings from Last 1 Encounters:  07/16/23 71         Passed - Valid encounter within last 12 months    Recent Outpatient Visits           2 months ago Neutropenia, unspecified type (HCC)   [place] [place], Melanie T, NP   8 months ago Medicare annual wellness visit, initial   [facility] [place], [place] T, NP   1 year ago Primary hypertension   [place] [facility] [place], [place] T, NP   1 year ago Neutropenia, unspecified type (HCC)   [facility] [place], Melanie T, NP   2 years ago Neutropenia, unspecified type [facility])   [place] [facility] [place], Melanie DASEN, NP       Future Appointments             In 3 months Cannady, Jolene T, NP [facility]

## 2023-12-09 ENCOUNTER — Other Ambulatory Visit: Payer: Self-pay | Admitting: Nurse Practitioner

## 2023-12-09 NOTE — Telephone Encounter (Signed)
 Prescription Request  12/09/2023  LOV: 07/16/2023  What is the name of the medication or equipment?  lisinopril (ZESTRIL) 10 MG tablet  rosuvastatin (CRESTOR) 10 MG tablet  fluticasone furoate-vilanterol (BREO ELLIPTA) 100-25 MCG/ACT AEPB    Have you contacted your pharmacy to request a refill? No   Which pharmacy would you like this sent to?   [id number] - [place], [place] - [address] [place] [address] Phone: [phone] Fax: [phone]   Patient notified that their request is being sent to the clinical staff for review and that they should receive a response within 2 business days.   Please advise at Mobile [phone] (mobile)

## 2023-12-10 ENCOUNTER — Other Ambulatory Visit: Payer: Self-pay | Admitting: Nurse Practitioner

## 2023-12-10 MED ORDER — ROSUVASTATIN CALCIUM 10 MG PO TABS: 10.0000 mg | ORAL_TABLET | Freq: Every day | ORAL | 4 refills | Status: AC

## 2023-12-10 MED ORDER — LISINOPRIL 10 MG PO TABS: ORAL_TABLET | ORAL | 4 refills | Status: AC

## 2023-12-10 MED ORDER — FLUTICASONE FUROATE-VILANTEROL 100-25 MCG/ACT IN AEPB: 1.0000 | INHALATION_SPRAY | Freq: Every day | RESPIRATORY_TRACT | 4 refills | Status: AC

## 2023-12-10 NOTE — Telephone Encounter (Signed)
 Copied from CRM [id number]. Topic: Clinical - Medication Refill >> Dec 10, 2023 11:46 AM Lizabeth Riggs wrote: Most Recent Primary Care Visit:  Provider: CANNADY, JOLENE T  Department: [facility]  Visit Type: OFFICE VISIT  Date: 07/16/2023  Medication: albuterol (VENTOLIN HFA) 108 (90 Base) MCG/ACT inhaler   Has the patient contacted their pharmacy? Yes (Agent: If no, request that the patient contact the pharmacy for the refill. If patient does not wish to contact the pharmacy document the reason why and proceed with request.) (Agent: If yes, when and what did the pharmacy advise?) Pharmacy needs an order for refill  Is this the correct pharmacy for this prescription? Yes If no, delete pharmacy and type the correct one.  This is the patient's preferred pharmacy:  [id number] - [place], [place] - [address] [place] [address] Phone: [phone] Fax: [phone]  Has the prescription been filled recently? No  Is the patient out of the medication? Yes  Has the patient been seen for an appointment in the last year OR does the patient have an upcoming appointment? Yes  Can we respond through MyChart? No  Agent: Please be advised that Rx refills may take up to 3 business days. We ask that you follow-up with your pharmacy.

## 2023-12-11 MED ORDER — ALBUTEROL SULFATE HFA 108 (90 BASE) MCG/ACT IN AERS: 2.0000 | INHALATION_SPRAY | RESPIRATORY_TRACT | 0 refills | Status: DC | PRN

## 2023-12-11 NOTE — Telephone Encounter (Signed)
 Requested Prescriptions  Pending Prescriptions Disp Refills   albuterol (VENTOLIN HFA) 108 (90 Base) MCG/ACT inhaler 18 g 0    Sig: Inhale 2 puffs into the lungs every 4 (four) hours as needed for wheezing or shortness of breath.     Pulmonology:  Beta Agonists 2 Failed - 12/11/2023  1:03 PM      Failed - Valid encounter within last 12 months    Recent Outpatient Visits   None     Future Appointments             In 1 month Cannady, Jolene T, NP [place] [facility]            Passed - Last BP in normal range    BP Readings from Last 1 Encounters:  07/16/23 125/83         Passed - Last Heart Rate in normal range    Pulse Readings from Last 1 Encounters:  07/16/23 71

## 2024-01-11 NOTE — Patient Instructions (Signed)
 Be Involved in Caring For Your Health:  Taking Medications When medications are taken as directed, they can greatly improve your health. But if they are not taken as prescribed, they may not work. In some cases, not taking them correctly can be harmful. To help ensure your treatment remains effective and safe, understand your medications and how to take them. Bring your medications to each visit for review by your provider.  Your lab results, notes, and after visit summary will be available on My Chart. We strongly encourage you to use this feature. If lab results are abnormal the clinic will contact you with the appropriate steps. If the clinic does not contact you assume the results are satisfactory. You can always view your results on My Chart. If you have questions regarding your health or results, please contact the clinic during office hours. You can also ask questions on My Chart.  We at [facility] are grateful that you chose [place] to provide your care. We strive to provide evidence-based and compassionate care and are always looking for feedback. If you get a survey from the clinic please complete this so we can hear your opinions.  DASH Eating Plan DASH stands for Dietary Approaches to Stop Hypertension. The DASH eating plan is a healthy eating plan that has been shown to: Lower high blood pressure (hypertension). Reduce your risk for type 2 diabetes, heart disease, and stroke. Help with weight loss. What are tips for following this plan? Reading food labels Check food labels for the amount of salt (sodium) per serving. Choose foods with less than 5 percent of the Daily Value (DV) of sodium. In general, foods with less than 300 milligrams (mg) of sodium per serving fit into this eating plan. To find whole grains, look for the word "whole" as the first word in the ingredient list. Shopping Buy products labeled as "low-sodium" or "no salt added." Buy fresh foods. Avoid canned  foods and pre-made or frozen meals. Cooking Try not to add salt when you cook. Use salt-free seasonings or herbs instead of table salt or sea salt. Check with your health care provider or pharmacist before using salt substitutes. Do not fry foods. Cook foods in healthy ways, such as baking, boiling, grilling, roasting, or broiling. Cook using oils that are good for your heart. These include olive, canola, avocado, soybean, and sunflower oil. Meal planning  Eat a balanced diet. This should include: 4 or more servings of fruits and 4 or more servings of vegetables each day. Try to fill half of your plate with fruits and vegetables. 6-8 servings of whole grains each day. 6 or less servings of lean meat, poultry, or fish each day. 1 oz is 1 serving. A 3 oz (85 g) serving of meat is about the same size as the palm of your hand. One egg is 1 oz (28 g). 2-3 servings of low-fat dairy each day. One serving is 1 cup (237 mL). 1 serving of nuts, seeds, or beans 5 times each week. 2-3 servings of heart-healthy fats. Healthy fats called omega-3 fatty acids are found in foods such as walnuts, flaxseeds, fortified milks, and eggs. These fats are also found in cold-water fish, such as sardines, salmon, and mackerel. Limit how much you eat of: Canned or prepackaged foods. Food that is high in trans fat, such as fried foods. Food that is high in saturated fat, such as fatty meat. Desserts and other sweets, sugary drinks, and other foods with added sugar. Full-fat  dairy products. Do not salt foods before eating. Do not eat more than 4 egg yolks a week. Try to eat at least 2 vegetarian meals a week. Eat more home-cooked food and less restaurant, buffet, and fast food. Lifestyle When eating at a restaurant, ask if your food can be made with less salt or no salt. If you drink alcohol: Limit how much you have to: 0-1 drink a day if you are male. 0-2 drinks a day if you are male. Know how much alcohol is in  your drink. In the [place] one drink is one 12 oz bottle of beer (355 mL), one 5 oz glass of wine (148 mL), or one 1 oz glass of hard liquor (44 mL). General information Avoid eating more than 2,300 mg of salt a day. If you have hypertension, you may need to reduce your sodium intake to 1,500 mg a day. Work with your provider to stay at a healthy body weight or lose weight. Ask what the best weight range is for you. On most days of the week, get at least 30 minutes of exercise that causes your heart to beat faster. This may include walking, swimming, or biking. Work with your provider or dietitian to adjust your eating plan to meet your specific calorie needs. What foods should I eat? Fruits All fresh, dried, or frozen fruit. Canned fruits that are in their natural juice and do not have sugar added to them. Vegetables Fresh or frozen vegetables that are raw, steamed, roasted, or grilled. Low-sodium or reduced-sodium tomato and vegetable juice. Low-sodium or reduced-sodium tomato sauce and tomato paste. Low-sodium or reduced-sodium canned vegetables. Grains Whole-grain or whole-wheat bread. Whole-grain or whole-wheat pasta. Brown rice. Orpah Cobb. Bulgur. Whole-grain and low-sodium cereals. Pita bread. Low-fat, low-sodium crackers. Whole-wheat flour tortillas. Meats and other proteins Skinless chicken or [place]. Ground chicken or [place]. Pork with fat trimmed off. Fish and seafood. Egg whites. Dried beans, peas, or lentils. Unsalted nuts, nut butters, and seeds. Unsalted canned beans. Lean cuts of beef with fat trimmed off. Low-sodium, lean precooked or cured meat, such as sausages or meat loaves. Dairy Low-fat (1%) or fat-free (skim) milk. Reduced-fat, low-fat, or fat-free cheeses. Nonfat, low-sodium ricotta or cottage cheese. Low-fat or nonfat yogurt. Low-fat, low-sodium cheese. Fats and oils Soft margarine without trans fats. Vegetable oil. Reduced-fat, low-fat, or light mayonnaise and salad  dressings (reduced-sodium). Canola, safflower, olive, avocado, soybean, and sunflower oils. Avocado. Seasonings and condiments Herbs. Spices. Seasoning mixes without salt. Other foods Unsalted popcorn and pretzels. Fat-free sweets. The items listed above may not be all the foods and drinks you can have. Talk to a dietitian to learn more. What foods should I avoid? Fruits Canned fruit in a light or heavy syrup. Fried fruit. Fruit in cream or butter sauce. Vegetables Creamed or fried vegetables. Vegetables in a cheese sauce. Regular canned vegetables that are not marked as low-sodium or reduced-sodium. Regular canned tomato sauce and paste that are not marked as low-sodium or reduced-sodium. Regular tomato and vegetable juices that are not marked as low-sodium or reduced-sodium. Rosita Fire. Olives. Grains Baked goods made with fat, such as croissants, muffins, or some breads. Dry pasta or rice meal packs. Meats and other proteins Fatty cuts of meat. Ribs. Fried meat. Tomasa Blase. Bologna, salami, and other precooked or cured meats, such as sausages or meat loaves, that are not lean and low in sodium. Fat from the back of a pig (fatback). Bratwurst. Salted nuts and seeds. Canned beans with added salt. Canned  or smoked fish. Whole eggs or egg yolks. Chicken or [place] with skin. Dairy Whole or 2% milk, cream, and half-and-half. Whole or full-fat cream cheese. Whole-fat or sweetened yogurt. Full-fat cheese. Nondairy creamers. Whipped toppings. Processed cheese and cheese spreads. Fats and oils Butter. Stick margarine. Lard. Shortening. Ghee. Bacon fat. Tropical oils, such as coconut, palm kernel, or palm oil. Seasonings and condiments Onion salt, garlic salt, seasoned salt, table salt, and sea salt. Worcestershire sauce. Tartar sauce. Barbecue sauce. Teriyaki sauce. Soy sauce, including reduced-sodium soy sauce. Steak sauce. Canned and packaged gravies. Fish sauce. Oyster sauce. Cocktail sauce. Store-bought  horseradish. Ketchup. Mustard. Meat flavorings and tenderizers. Bouillon cubes. Hot sauces. Pre-made or packaged marinades. Pre-made or packaged taco seasonings. Relishes. Regular salad dressings. Other foods Salted popcorn and pretzels. The items listed above may not be all the foods and drinks you should avoid. Talk to a dietitian to learn more. Where to find more information National Heart, Lung, and Blood Institute (NHLBI): BuffaloDryCleaner.gl American Heart Association (AHA): heart.org Academy of Nutrition and Dietetics: eatright.org National Kidney Foundation (NKF): kidney.org This information is not intended to replace advice given to you by your health care provider. Make sure you discuss any questions you have with your health care provider. Document Revised: 08/29/2022 Document Reviewed: 08/29/2022 Elsevier Patient Education  2024 [employer].

## 2024-01-14 ENCOUNTER — Ambulatory Visit (INDEPENDENT_AMBULATORY_CARE_PROVIDER_SITE_OTHER): Payer: Medicare HMO | Admitting: Nurse Practitioner

## 2024-01-14 ENCOUNTER — Encounter: Payer: Self-pay | Admitting: Nurse Practitioner

## 2024-01-14 VITALS — BP 122/79 | HR 73 | Temp 97.9°F | Ht 66.6 in | Wt 160.6 lb

## 2024-01-14 DIAGNOSIS — Z0000 Encounter for general adult medical examination without abnormal findings: Secondary | ICD-10-CM | POA: Diagnosis not present

## 2024-01-14 DIAGNOSIS — E7800 Pure hypercholesterolemia, unspecified: Secondary | ICD-10-CM | POA: Diagnosis not present

## 2024-01-14 DIAGNOSIS — J4540 Moderate persistent asthma, uncomplicated: Secondary | ICD-10-CM | POA: Diagnosis not present

## 2024-01-14 DIAGNOSIS — Z9889 Other specified postprocedural states: Secondary | ICD-10-CM | POA: Diagnosis not present

## 2024-01-14 DIAGNOSIS — N400 Enlarged prostate without lower urinary tract symptoms: Secondary | ICD-10-CM

## 2024-01-14 DIAGNOSIS — R801 Persistent proteinuria, unspecified: Secondary | ICD-10-CM

## 2024-01-14 DIAGNOSIS — D709 Neutropenia, unspecified: Secondary | ICD-10-CM | POA: Diagnosis not present

## 2024-01-14 DIAGNOSIS — Z23 Encounter for immunization: Secondary | ICD-10-CM

## 2024-01-14 DIAGNOSIS — I10 Essential (primary) hypertension: Secondary | ICD-10-CM

## 2024-01-14 LAB — MICROALBUMIN, URINE WAIVED
Creatinine, Urine Waived: 10 mg/dL (ref 10–300)
Microalb, Ur Waived: 10 mg/L (ref 0–19)

## 2024-01-14 NOTE — Assessment & Plan Note (Signed)
 Chronic, stable.  BP at goal in office today.  Continue Lisinopril  daily.  Recommend he monitor BP at least a few mornings a week at home and document.  DASH diet at home.  Continue current medication regimen and adjust as needed.  Labs: CMP, CBC, TSH, urine ALB.

## 2024-01-14 NOTE — Assessment & Plan Note (Signed)
 Chronic, ongoing.  Continue current medication regimen and adjust as needed. Lipid panel today.

## 2024-01-14 NOTE — Assessment & Plan Note (Signed)
 Chronic, ongoing. Continue Albuterol  as needed and Breo maintenance.  Adjust regimen as needed based on symptoms.  Spirometry next visit.  Return in 6 months.

## 2024-01-14 NOTE — Assessment & Plan Note (Signed)
 Noted on past checks, last visit was 30 in 2024.  Continue Lisinopril  for kidney protection.  Blood work obtained today.

## 2024-01-14 NOTE — Assessment & Plan Note (Signed)
 On 07/29/20, lumbar laminectomy.  At this time will place referral to PT to work on ongoing left leg weakness, may need to return to neurosurgery in future (Dr. Jeris Montes).

## 2024-01-14 NOTE — Progress Notes (Signed)
 BP 122/79   Pulse 73   Temp 97.9 F (36.6 C) (Oral)   Ht 5' 6.6" (1.692 m)   Wt 160 lb 9.6 oz (72.8 kg)   SpO2 98%   BMI 25.46 kg/m    Subjective:    Patient ID: David Quinn, male    DOB: October 21, 1955, 68 y.o.   MRN: [id number]  HPI: BLASE BECKNER is a 68 y.o. male presenting on 01/14/2024 for comprehensive medical examination. Current medical complaints include:none  He currently lives with: significant other Interim Problems from his last visit: no  History of lumbar laminectomy/decompression on 07/29/20. Went to [facility] in past. Still has some weakness to left leg.  HYPERTENSION / HYPERLIPIDEMIA Continues to take Lisinopril  and Crestor  daily. Satisfied with current treatment? yes Duration of hypertension: chronic BP monitoring frequency: not checking BP range:  BP medication side effects: no Duration of hyperlipidemia: chronic Aspirin : no Recent stressors: no Recurrent headaches: no Visual changes: no Palpitations: no Dyspnea: no Chest pain: no Lower extremity edema: no Dizzy/lightheaded: no  The 10-year ASCVD risk score (Arnett DK, et al., 2019) is: 13.6%   Values used to calculate the score:     Age: 80 years     Sex: Male     Is Non-Hispanic African American: Yes     Diabetic: No     Tobacco smoker: No     Systolic Blood Pressure: 122 mmHg     Is BP treated: Yes     HDL Cholesterol: 70 mg/dL     Total Cholesterol: 163 mg/dL  ASTHMA Uses Breo and Albuterol . Low WBC that waxes and wanes.  No B symptoms. Initially presented, in 2020. Asthma status: stable Satisfied with current treatment?: yes Albuterol /rescue inhaler frequency: once a week Dyspnea frequency: no Wheezing frequency: no Cough frequency:  no Nocturnal symptom frequency: none Limitation of activity: no Current upper respiratory symptoms: no Triggers: changes in weather Home peak flows: Last Spirometry: unknown Failed/intolerant to following asthma meds: unknown Asthma meds  in past:  Aerochamber/spacer use: no Visits to ER or Urgent Care in past year: no Pneumovax: Had PPSV23 in 2014, will get PCV20 in future Influenza: refuses     Latest Ref Rng & Units 07/16/2023    1:50 PM 02/14/2023    1:55 PM 01/13/2023    2:23 PM  CBC  WBC 3.4 - 10.8 x10E3/uL 3.3  4.0  3.3   Hemoglobin 13.0 - 17.7 g/dL 03.4  74.2  59.5   Hematocrit 37.5 - 51.0 % 43.1  42.3  42.4   Platelets 150 - 450 x10E3/uL 221  265  269     Functional Status Survey: Is the patient deaf or have difficulty hearing?: No Does the patient have difficulty seeing, even when wearing glasses/contacts?: No Does the patient have difficulty concentrating, remembering, or making decisions?: No Does the patient have difficulty walking or climbing stairs?: No Does the patient have difficulty dressing or bathing?: No Does the patient have difficulty doing errands alone such as visiting a doctor's office or shopping?: No     08/02/2020    7:39 AM 05/13/2022    4:00 PM 01/13/2023    2:21 PM 07/16/2023    1:18 PM 01/14/2024    1:08 PM  Fall Risk  Falls in the past year?  0 0 0 0  Was there an injury with Fall?  0 0 0 0  Fall Risk Category Calculator  0 0 0 0  Fall Risk Category (Retired)  Low     (  RETIRED) Patient Fall Risk Level High fall risk Low fall risk     Patient at Risk for Falls Due to  No Fall Risks No Fall Risks No Fall Risks No Fall Risks  Fall risk Follow up  Falls evaluation completed Falls evaluation completed Falls evaluation completed Falls evaluation completed       01/14/2024    1:08 PM 07/16/2023    1:18 PM 01/13/2023    2:21 PM 05/13/2022    4:01 PM 02/08/2022    4:10 PM  Depression screen PHQ 2/9  Decreased Interest 0 0 0 0 0  Down, Depressed, Hopeless 0 0 0 0 0  PHQ - 2 Score 0 0 0 0 0  Altered sleeping 0 0 0 0 0  Tired, decreased energy 0 0 0 0 0  Change in appetite 0 0 0 0 0  Feeling bad or failure about yourself  0 0 0 0 0  Trouble concentrating 0 0 0 0 0  Moving slowly or  fidgety/restless 0 0 0 0 0  Suicidal thoughts 0 0 0 0 0  PHQ-9 Score 0 0 0 0 0  Difficult doing work/chores Not difficult at all Not difficult at all Not difficult at all Not difficult at all Not difficult at all       01/14/2024    1:08 PM 07/16/2023    1:19 PM 01/13/2023    2:21 PM 05/13/2022    4:01 PM  GAD 7 : Generalized Anxiety Score  Nervous, Anxious, on Edge 0 0 0 0  Control/stop worrying 0 0 0 0  Worry too much - different things 0 0 0 0  Trouble relaxing 0 0 0 0  Restless 0 0 0 0  Easily annoyed or irritable 0 0 0 0  Afraid - awful might happen 0 0 0 0  Total GAD 7 Score 0 0 0 0  Anxiety Difficulty Not difficult at all Not difficult at all Not difficult at all Not difficult at all   Advanced Directives Does patient have a HCPOA?    no If yes, name and contact information:  Does patient have a living will or MOST form?  no  Past Medical History:  Past Medical History:  Diagnosis Date   Arthritis    Asthma    Hypertension     Surgical History:  Past Surgical History:  Procedure Laterality Date   COLONOSCOPY WITH PROPOFOL  N/A 02/05/2023   Procedure: COLONOSCOPY WITH PROPOFOL ;  Surgeon: Marnee Sink, MD;  Location: [facility] ENDOSCOPY;  Service: Endoscopy;  Laterality: N/A;  REQUESTS ABOUT 10 AM ARRIVAL   JOINT REPLACEMENT Bilateral    hip replacement   LUMBAR LAMINECTOMY/DECOMPRESSION MICRODISCECTOMY Left 07/29/2020   Procedure: LUMBAR LAMINECTOMY/DECOMPRESSION MICRODISCECTOMY 2 LEVELS;  Surgeon: Jodeen Munch, MD;  Location: [facility] ORS;  Service: Neurosurgery;  Laterality: Left;   TRACHEOSTOMY      Medications:  Current Outpatient Medications on File Prior to Visit  Medication Sig   albuterol  (VENTOLIN  HFA) 108 (90 Base) MCG/ACT inhaler Inhale 2 puffs into the lungs every 4 (four) hours as needed for wheezing or shortness of breath.   aspirin  EC 81 MG tablet Take 81 mg by mouth daily. Swallow whole.   fluticasone  furoate-vilanterol (BREO ELLIPTA ) 100-25 MCG/ACT  AEPB Inhale 1 puff into the lungs daily.   lisinopril  (ZESTRIL ) 10 MG tablet TAKE 1 TABLET(10 MG) BY MOUTH DAILY   rosuvastatin  (CRESTOR ) 10 MG tablet Take 1 tablet (10 mg total) by mouth daily.   No current facility-administered  medications on file prior to visit.    Allergies:  No Known Allergies  Social History:  Social History   Socioeconomic History   Marital status: Married    Spouse name: Not on file   Number of children: Not on file   Years of education: Not on file   Highest education level: Not on file  Occupational History   Not on file  Tobacco Use   Smoking status: Never   Smokeless tobacco: Never  Vaping Use   Vaping status: Never Used  Substance and Sexual Activity   Alcohol use: No    Alcohol/week: 0.0 standard drinks of alcohol   Drug use: No   Sexual activity: Yes  Other Topics Concern   Not on file  Social History Narrative   Not on file   Social Drivers of Health   Financial Resource Strain: Low Risk  (01/13/2023)   Overall Financial Resource Strain (CARDIA)    Difficulty of Paying Living Expenses: Not hard at all  Food Insecurity: No Food Insecurity (01/13/2023)   Hunger Vital Sign    Worried About Running Out of Food in the Last Year: Never true    Ran Out of Food in the Last Year: Never true  Transportation Needs: No Transportation Needs (01/13/2023)   PRAPARE - Administrator, Civil Service (Medical): No    Lack of Transportation (Non-Medical): No  Physical Activity: Insufficiently Active (01/13/2023)   Exercise Vital Sign    Days of Exercise per Week: 2 days    Minutes of Exercise per Session: 20 min  Stress: No Stress Concern Present (01/13/2023)   [employer] of Occupational Health - Occupational Stress Questionnaire    Feeling of Stress : Not at all  Social Connections: Socially Integrated (01/13/2023)   Social Connection and Isolation Panel [NHANES]    Frequency of Communication with Friends and Family: More than three  times a week    Frequency of Social Gatherings with Friends and Family: More than three times a week    Attends Religious Services: More than 4 times per year    Active Member of [employer] or Organizations: Yes    Attends Banker Meetings: Never    Marital Status: Married  Catering manager Violence: Not At Risk (01/13/2023)   Humiliation, Afraid, Rape, and Kick questionnaire    Fear of Current or Ex-Partner: No    Emotionally Abused: No    Physically Abused: No    Sexually Abused: No   Social History   Tobacco Use  Smoking Status Never  Smokeless Tobacco Never   Social History   Substance and Sexual Activity  Alcohol Use No   Alcohol/week: 0.0 standard drinks of alcohol   Family History:  Family History  Problem Relation Age of Onset   Asthma Mother    Cancer Mother    Hyperlipidemia Mother    Hypertension Mother    Hypertension Father    Stroke Father    Cancer Brother        stomach   Seizures Brother     Past medical history, surgical history, medications, allergies, family history and social history reviewed with patient today and changes made to appropriate areas of the chart.   ROS All other ROS negative except what is listed above and in the HPI.      Objective:    BP 122/79   Pulse 73   Temp 97.9 F (36.6 C) (Oral)   Ht 5' 6.6" (1.692 m)  Wt 160 lb 9.6 oz (72.8 kg)   SpO2 98%   BMI 25.46 kg/m   Wt Readings from Last 3 Encounters:  01/14/24 160 lb 9.6 oz (72.8 kg)  07/16/23 166 lb (75.3 kg)  02/05/23 154 lb (69.9 kg)    Physical Exam Vitals and nursing note reviewed.  Constitutional:      General: He is awake. He is not in acute distress.    Appearance: He is well-developed and well-groomed. He is not ill-appearing or toxic-appearing.  HENT:     Head: Normocephalic and atraumatic.     Right Ear: Hearing, tympanic membrane, ear canal and external ear normal. No drainage.     Left Ear: Hearing, tympanic membrane, ear canal and  external ear normal. No drainage.     Nose: Nose normal.     Mouth/Throat:     Pharynx: Uvula midline.  Eyes:     General: Lids are normal.        Right eye: No discharge.        Left eye: No discharge.     Extraocular Movements: Extraocular movements intact.     Conjunctiva/sclera: Conjunctivae normal.     Pupils: Pupils are equal, round, and reactive to light.     Visual Fields: Right eye visual fields normal and left eye visual fields normal.  Neck:     Thyroid: No thyromegaly.     Vascular: No carotid bruit or JVD.     Trachea: Trachea normal.  Cardiovascular:     Rate and Rhythm: Normal rate and regular rhythm.     Heart sounds: Normal heart sounds, S1 normal and S2 normal. No murmur heard.    No gallop.  Pulmonary:     Effort: Pulmonary effort is normal. No accessory muscle usage or respiratory distress.     Breath sounds: Normal breath sounds.  Abdominal:     General: Bowel sounds are normal.     Palpations: Abdomen is soft. There is no hepatomegaly or splenomegaly.     Tenderness: There is no abdominal tenderness.  Musculoskeletal:        General: Normal range of motion.     Cervical back: Normal range of motion and neck supple.     Right lower leg: No edema.     Left lower leg: No edema.  Lymphadenopathy:     Head:     Right side of head: No submental, submandibular, tonsillar, preauricular or posterior auricular adenopathy.     Left side of head: No submental, submandibular, tonsillar, preauricular or posterior auricular adenopathy.     Cervical: No cervical adenopathy.  Skin:    General: Skin is warm and dry.     Capillary Refill: Capillary refill takes less than 2 seconds.     Findings: No rash.  Neurological:     Mental Status: He is alert and oriented to person, place, and time.     Gait: Gait is intact.     Deep Tendon Reflexes: Reflexes are normal and symmetric.     Reflex Scores:      Brachioradialis reflexes are 2+ on the right side and 2+ on the left  side.      Patellar reflexes are 2+ on the right side and 2+ on the left side. Psychiatric:        Attention and Perception: Attention normal.        Mood and Affect: Mood normal.        Speech: Speech normal.  Behavior: Behavior normal. Behavior is cooperative.        Thought Content: Thought content normal.        Cognition and Memory: Cognition normal.       01/13/2023    2:51 PM  6CIT Screen  What Year? 0 points  What month? 0 points  What time? 0 points  Count back from 20 0 points  Months in reverse 0 points  Repeat phrase 0 points  Total Score 0 points    Results for orders placed or performed in visit on 07/16/23  CBC with Differential/Platelet   Collection Time: 07/16/23  1:50 PM  Result Value Ref Range   WBC 3.3 (L) 3.4 - 10.8 x10E3/uL   RBC 5.17 4.14 - 5.80 x10E6/uL   Hemoglobin 13.8 13.0 - 17.7 g/dL   Hematocrit 38.7 56.4 - 51.0 %   MCV 83 79 - 97 fL   MCH 26.7 26.6 - 33.0 pg   MCHC 32.0 31.5 - 35.7 g/dL   RDW 33.2 95.1 - 88.4 %   Platelets 221 150 - 450 x10E3/uL   Neutrophils 46 Not Estab. %   Lymphs 35 Not Estab. %   Monocytes 11 Not Estab. %   Eos 7 Not Estab. %   Basos 1 Not Estab. %   Neutrophils Absolute 1.5 1.4 - 7.0 x10E3/uL   Lymphocytes Absolute 1.1 0.7 - 3.1 x10E3/uL   Monocytes Absolute 0.4 0.1 - 0.9 x10E3/uL   EOS (ABSOLUTE) 0.2 0.0 - 0.4 x10E3/uL   Basophils Absolute 0.0 0.0 - 0.2 x10E3/uL   Immature Granulocytes 0 Not Estab. %   Immature Grans (Abs) 0.0 0.0 - 0.1 x10E3/uL  Comprehensive metabolic panel   Collection Time: 07/16/23  1:50 PM  Result Value Ref Range   Glucose 69 (L) 70 - 99 mg/dL   BUN 17 8 - 27 mg/dL   Creatinine, Ser 1.66 0.76 - 1.27 mg/dL   eGFR 76 >06 TK/ZSW/1.09   BUN/Creatinine Ratio 16 10 - 24   Sodium 137 134 - 144 mmol/L   Potassium 5.2 3.5 - 5.2 mmol/L   Chloride 101 96 - 106 mmol/L   CO2 22 20 - 29 mmol/L   Calcium  9.6 8.6 - 10.2 mg/dL   Total Protein 7.1 6.0 - 8.5 g/dL   Albumin 4.2 3.9 - 4.9 g/dL    Globulin, Total 2.9 1.5 - 4.5 g/dL   Bilirubin Total 0.5 0.0 - 1.2 mg/dL   Alkaline Phosphatase 87 44 - 121 IU/L   AST 38 0 - 40 IU/L   ALT 37 0 - 44 IU/L  Lipid Panel w/o Chol/HDL Ratio   Collection Time: 07/16/23  1:50 PM  Result Value Ref Range   Cholesterol, Total 163 100 - 199 mg/dL   Triglycerides 58 0 - 149 mg/dL   HDL 70 >32 mg/dL   VLDL Cholesterol Cal 12 5 - 40 mg/dL   LDL Chol Calc (NIH) 81 0 - 99 mg/dL      Assessment & Plan:   Problem List Items Addressed This Visit       Cardiovascular and Mediastinum   Hypertension   Chronic, stable.  BP at goal in office today.  Continue Lisinopril  daily.  Recommend he monitor BP at least a few mornings a week at home and document.  DASH diet at home.  Continue current medication regimen and adjust as needed.  Labs: CMP, CBC, TSH, urine ALB.        Relevant Orders   CBC with Differential/Platelet  Comprehensive metabolic panel with GFR   TSH     Respiratory   Asthma, moderate persistent   Chronic, ongoing. Continue Albuterol  as needed and Breo maintenance.  Adjust regimen as needed based on symptoms.  Spirometry next visit.  Return in 6 months.        Relevant Orders   CBC with Differential/Platelet     Other   Proteinuria   Noted on past checks, last visit was 30 in 2024.  Continue Lisinopril  for kidney protection.  Blood work obtained today.      Relevant Orders   Comprehensive metabolic panel with GFR   Microalbumin, Urine Waived   Neutropenia (HCC) - Primary   Noted on labs, initially in 2020 with WBC 2.5, recent was 3.3 (normal).  Check CBC today.  No B symptoms.  Consider hematology referral if ongoing -- discussed at length with him today.      Relevant Orders   CBC with Differential/Platelet   C-reactive protein   Sed Rate (ESR)   Hyperlipidemia   Chronic, ongoing.  Continue current medication regimen and adjust as needed. Lipid panel today.        Relevant Orders   Comprehensive metabolic panel  with GFR   Lipid Panel w/o Chol/HDL Ratio   History of back surgery   On 07/29/20, lumbar laminectomy.  At this time will place referral to PT to work on ongoing left leg weakness, may need to return to neurosurgery in future (Dr. Jeris Montes).      Relevant Orders   Ambulatory referral to Physical Therapy   Other Visit Diagnoses       Benign prostatic hyperplasia without lower urinary tract symptoms       PSA on labs today.   Relevant Orders   PSA     Encounter for annual physical exam       Annual physical today with labs and health maintenance reviewed, discussed with patient.      Discussed aspirin  prophylaxis for myocardial infarction prevention and decision was it was not indicated  LABORATORY TESTING:  Health maintenance labs ordered today as discussed above.   The natural history of prostate cancer and ongoing controversy regarding screening and potential treatment outcomes of prostate cancer has been discussed with the patient. The meaning of a false positive PSA and a false negative PSA has been discussed. He indicates understanding of the limitations of this screening test and wishes to proceed with screening PSA testing.   IMMUNIZATIONS:   - Tdap: Tetanus vaccination status reviewed: last tetanus booster within 10 years. - Influenza: Refused - Pneumovax: Had in 2014 - Prevnar: will get PCV20 in future - Zostavax vaccine: Refused  SCREENING: - Colonoscopy: Up To Date Discussed with patient purpose of the colonoscopy is to detect colon cancer at curable precancerous or early stages   - AAA Screening: Not applicable  -Hearing Test: Not applicable  -Spirometry: Not applicable   PATIENT COUNSELING:    Sexuality: Discussed sexually transmitted diseases, partner selection, use of condoms, avoidance of unintended pregnancy  and contraceptive alternatives.   Advised to avoid cigarette smoking.  I discussed with the patient that most people either abstain from alcohol  or drink within safe limits (<=14/week and <=4 drinks/occasion for males, <=7/weeks and <= 3 drinks/occasion for females) and that the risk for alcohol disorders and other health effects rises proportionally with the number of drinks per week and how often a drinker exceeds daily limits.  Discussed cessation/primary prevention of drug use and availability of  treatment for abuse.   Diet: Encouraged to adjust caloric intake to maintain  or achieve ideal body weight, to reduce intake of dietary saturated fat and total fat, to limit sodium intake by avoiding high sodium foods and not adding table salt, and to maintain adequate dietary potassium and calcium  preferably from fresh fruits, vegetables, and low-fat dairy products.    Stressed the importance of regular exercise  Injury prevention: Discussed safety belts, safety helmets, smoke detector, smoking near bedding or upholstery.   Dental health: Discussed importance of regular tooth brushing, flossing, and dental visits.   Follow up plan: NEXT PREVENTATIVE PHYSICAL DUE IN 1 YEAR. Return in about 6 months (around 07/16/2024) for HTN/HLD.

## 2024-01-14 NOTE — Assessment & Plan Note (Signed)
 Noted on labs, initially in 2020 with WBC 2.5, recent was 3.3 (normal).  Check CBC today.  No B symptoms.  Consider hematology referral if ongoing -- discussed at length with him today.

## 2024-01-15 ENCOUNTER — Ambulatory Visit: Payer: Self-pay | Admitting: Nurse Practitioner

## 2024-01-15 LAB — CBC WITH DIFFERENTIAL/PLATELET
Basophils Absolute: 0 10*3/uL (ref 0.0–0.2)
Basos: 1 %
EOS (ABSOLUTE): 0.4 10*3/uL (ref 0.0–0.4)
Eos: 12 %
Hematocrit: 41.7 % (ref 37.5–51.0)
Hemoglobin: 13.1 g/dL (ref 13.0–17.7)
Immature Grans (Abs): 0 10*3/uL (ref 0.0–0.1)
Immature Granulocytes: 0 %
Lymphocytes Absolute: 1.1 10*3/uL (ref 0.7–3.1)
Lymphs: 30 %
MCH: 25.9 pg — ABNORMAL LOW (ref 26.6–33.0)
MCHC: 31.4 g/dL — ABNORMAL LOW (ref 31.5–35.7)
MCV: 83 fL (ref 79–97)
Monocytes Absolute: 0.4 10*3/uL (ref 0.1–0.9)
Monocytes: 11 %
Neutrophils Absolute: 1.7 10*3/uL (ref 1.4–7.0)
Neutrophils: 46 %
Platelets: 267 10*3/uL (ref 150–450)
RBC: 5.05 x10E6/uL (ref 4.14–5.80)
RDW: 12.8 % (ref 11.6–15.4)
WBC: 3.6 10*3/uL (ref 3.4–10.8)

## 2024-01-15 LAB — COMPREHENSIVE METABOLIC PANEL WITH GFR
ALT: 38 IU/L (ref 0–44)
AST: 38 IU/L (ref 0–40)
Albumin: 4.2 g/dL (ref 3.9–4.9)
Alkaline Phosphatase: 77 IU/L (ref 44–121)
BUN/Creatinine Ratio: 20 (ref 10–24)
BUN: 22 mg/dL (ref 8–27)
Bilirubin Total: 0.5 mg/dL (ref 0.0–1.2)
CO2: 21 mmol/L (ref 20–29)
Calcium: 9 mg/dL (ref 8.6–10.2)
Chloride: 97 mmol/L (ref 96–106)
Creatinine, Ser: 1.09 mg/dL (ref 0.76–1.27)
Globulin, Total: 2.8 g/dL (ref 1.5–4.5)
Glucose: 73 mg/dL (ref 70–99)
Potassium: 4.8 mmol/L (ref 3.5–5.2)
Sodium: 133 mmol/L — ABNORMAL LOW (ref 134–144)
Total Protein: 7 g/dL (ref 6.0–8.5)
eGFR: 74 mL/min/{1.73_m2} (ref >59)

## 2024-01-15 LAB — PSA: Prostate Specific Ag, Serum: 2 ng/mL (ref 0.0–4.0)

## 2024-01-15 LAB — LIPID PANEL W/O CHOL/HDL RATIO
Cholesterol, Total: 141 mg/dL (ref 100–199)
HDL: 61 mg/dL (ref >39)
LDL Chol Calc (NIH): 67 mg/dL (ref 0–99)
Triglycerides: 61 mg/dL (ref 0–149)
VLDL Cholesterol Cal: 13 mg/dL (ref 5–40)

## 2024-01-15 LAB — C-REACTIVE PROTEIN: CRP: 3 mg/L (ref 0–10)

## 2024-01-15 LAB — SEDIMENTATION RATE: Sed Rate: 31 mm/h — ABNORMAL HIGH (ref 0–30)

## 2024-01-15 LAB — TSH: TSH: 1.6 u[IU]/mL (ref 0.450–4.500)

## 2024-01-20 ENCOUNTER — Telehealth: Payer: Self-pay | Admitting: Nurse Practitioner

## 2024-01-20 NOTE — Telephone Encounter (Unsigned)
 Copied from CRM [id number]. Topic: Clinical - Medication Refill >> Jan 20, 2024  1:37 PM Star East wrote: Medication: albuterol  (VENTOLIN  HFA) 108 (90 Base) MCG/ACT inhaler  Has the patient contacted their pharmacy? No (Agent: If no, request that the patient contact the pharmacy for the refill. If patient does not wish to contact the pharmacy document the reason why and proceed with request.) (Agent: If yes, when and what did the pharmacy advise?)  This is the patient's preferred pharmacy:  [id number] - [place], [place] - [address] [place] [address] Phone: [phone] Fax: [phone]    Is this the correct pharmacy for this prescription? Yes If no, delete pharmacy and type the correct one.   Has the prescription been filled recently? Yes  Is the patient out of the medication? Yes  Has the patient been seen for an appointment in the last year OR does the patient have an upcoming appointment? Yes  Can we respond through MyChart? No  Agent: Please be advised that Rx refills may take up to 3 business days. We ask that you follow-up with your pharmacy.

## 2024-01-22 MED ORDER — ALBUTEROL SULFATE HFA 108 (90 BASE) MCG/ACT IN AERS: 2.0000 | INHALATION_SPRAY | RESPIRATORY_TRACT | 2 refills | Status: DC | PRN

## 2024-01-22 NOTE — Telephone Encounter (Signed)
 Requested Prescriptions  Pending Prescriptions Disp Refills   albuterol  (VENTOLIN  HFA) 108 (90 Base) MCG/ACT inhaler 18 g 2    Sig: Inhale 2 puffs into the lungs every 4 (four) hours as needed for wheezing or shortness of breath.     Pulmonology:  Beta Agonists 2 Passed - 01/22/2024  5:38 PM      Passed - Last BP in normal range    BP Readings from Last 1 Encounters:  01/14/24 122/79         Passed - Last Heart Rate in normal range    Pulse Readings from Last 1 Encounters:  01/14/24 73         Passed - Valid encounter within last 12 months    Recent Outpatient Visits           1 week ago Neutropenia, unspecified type [facility])   [place] [facility] [place], Lavelle Posey, NP

## 2024-03-29 ENCOUNTER — Telehealth: Payer: Self-pay | Admitting: Nurse Practitioner

## 2024-03-29 NOTE — Telephone Encounter (Signed)
 Copied from CRM [id number]. Topic: Medicare AWV >> Mar 29, 2024 10:22 AM Nathanel DEL wrote: Reason for CRM: Called LVM 03/29/2024 to schedule AWV. Please schedule office or virtual visits.  Nathanel Paschal; Care Guide Ambulatory Clinical Support [place] [facility] Direct Dial: [phone]

## 2024-04-22 ENCOUNTER — Other Ambulatory Visit: Payer: Self-pay | Admitting: Nurse Practitioner

## 2024-04-23 NOTE — Telephone Encounter (Signed)
 Requested Prescriptions  Pending Prescriptions Disp Refills   albuterol  (VENTOLIN  HFA) 108 (90 Base) MCG/ACT inhaler [Pharmacy Med Name: ALBUTEROL  HFA (PROAIR ) INHALER] 8.5 each 2    Sig: INHALE 2 PUFFS INTO THE LUNGS EVERY 4 HOURS AS NEEDED FOR WHEEZING OR SHORTNESS OF BREATH.     Pulmonology:  Beta Agonists 2 Passed - 04/23/2024  1:52 PM      Passed - Last BP in normal range    BP Readings from Last 1 Encounters:  01/14/24 122/79         Passed - Last Heart Rate in normal range    Pulse Readings from Last 1 Encounters:  01/14/24 73         Passed - Valid encounter within last 12 months    Recent Outpatient Visits           3 months ago Neutropenia, unspecified type (HCC)   [place] [facility] [place], Melanie DASEN, NP

## 2024-06-03 ENCOUNTER — Ambulatory Visit (INDEPENDENT_AMBULATORY_CARE_PROVIDER_SITE_OTHER): Admitting: Emergency Medicine

## 2024-06-03 VITALS — Ht 67.0 in | Wt 160.0 lb

## 2024-06-03 DIAGNOSIS — Z0000 Encounter for general adult medical examination without abnormal findings: Secondary | ICD-10-CM

## 2024-06-03 NOTE — Patient Instructions (Signed)
 David Quinn,  Thank you for taking the time for your Medicare Wellness Visit. I appreciate your continued commitment to your health goals. Please review the care plan we discussed, and feel free to reach out if I can assist you further.  Medicare recommends these wellness visits once per year to help you and your care team stay ahead of potential health issues. These visits are designed to focus on prevention, allowing your provider to concentrate on managing your acute and chronic conditions during your regular appointments.  Please note that Annual Wellness Visits do not include a physical exam. Some assessments may be limited, especially if the visit was conducted virtually. If needed, we may recommend a separate in-person follow-up with your provider.  Ongoing Care Seeing your primary care provider every 3 to 6 months helps us  monitor your health and provide consistent, personalized care.    Referrals If a referral was made during today's visit and you haven't received any updates within two weeks, please contact the referred provider directly to check on the status.  Recommended Screenings:  Call Dr. Lavona office @ [phone] to schedule a routine eye exam at your earliest convenience.   Health Maintenance  Topic Date Due   Zoster (Shingles) Vaccine (1 of 2) Never done   COVID-19 Vaccine (1 - 2025-26 season) Never done   Pneumococcal Vaccine for age over 25 (2 of 2 - PCV) 07/15/2024*   Flu Shot  11/23/2024*   Medicare Annual Wellness Visit  06/03/2025   DTaP/Tdap/Td vaccine (3 - Tdap) 10/09/2027   Colon Cancer Screening  02/04/2033   Hepatitis C Screening  Completed   Meningitis B Vaccine  Aged Out  *Topic was postponed. The date shown is not the original due date.       06/03/2024    3:20 PM  Advanced Directives  Does Patient Have a Medical Advance Directive? No  Would patient like information on creating a medical advance directive? Yes (MAU/Ambulatory/Procedural  Areas - Information given)   Advance Care Planning is important because it: Ensures you receive medical care that aligns with your values, goals, and preferences. Provides guidance to your family and loved ones, reducing the emotional burden of decision-making during critical moments.  Vision: Annual vision screenings are recommended for early detection of glaucoma, cataracts, and diabetic retinopathy. These exams can also reveal signs of chronic conditions such as diabetes and high blood pressure.  Dental: Annual dental screenings help detect early signs of oral cancer, gum disease, and other conditions linked to overall health, including heart disease and diabetes.  Please see the attached documents for additional preventive care recommendations.    Fall Prevention in the Home, Adult Falls can cause injuries and affect people of all ages. There are many simple things that you can do to make your home safe and to help prevent falls. If you need it, ask for help making these changes. What actions can I take to prevent falls? General information Use good lighting in all rooms. Make sure to: Replace any light bulbs that burn out. Turn on lights if it is dark and use night-lights. Keep items that you use often in easy-to-reach places. Lower the shelves around your home if needed. Move furniture so that there are clear paths around it. Do not keep throw rugs or other things on the floor that can make you trip. If any of your floors are uneven, fix them. Add color or contrast paint or tape to clearly mark and help you see: Grab  bars or handrails. First and last steps of staircases. Where the edge of each step is. If you use a ladder or stepladder: Make sure that it is fully opened. Do not climb a closed ladder. Make sure the sides of the ladder are locked in place. Have someone hold the ladder while you use it. Know where your pets are as you move through your home. What can I do in the  bathroom?     Keep the floor dry. Clean up any water that is on the floor right away. Remove soap buildup in the bathtub or shower. Buildup makes bathtubs and showers slippery. Use non-skid mats or decals on the floor of the bathtub or shower. Attach bath mats securely with double-sided, non-slip rug tape. If you need to sit down while you are in the shower, use a non-slip stool. Install grab bars by the toilet and in the bathtub and shower. Do not use towel bars as grab bars. What can I do in the bedroom? Make sure that you have a light by your bed that is easy to reach. Do not use any sheets or blankets on your bed that hang to the floor. Have a firm bench or chair with side arms that you can use for support when you get dressed. What can I do in the kitchen? Clean up any spills right away. If you need to reach something above you, use a sturdy step stool that has a grab bar. Keep electrical cables out of the way. Do not use floor polish or wax that makes floors slippery. What can I do with my stairs? Do not leave anything on the stairs. Make sure that you have a light switch at the top and the bottom of the stairs. Have them installed if you do not have them. Make sure that there are handrails on both sides of the stairs. Fix handrails that are broken or loose. Make sure that handrails are as long as the staircases. Install non-slip stair treads on all stairs in your home if they do not have carpet. Avoid having throw rugs at the top or bottom of stairs, or secure the rugs with carpet tape to prevent them from moving. Choose a carpet design that does not hide the edge of steps on the stairs. Make sure that carpet is firmly attached to the stairs. Fix any carpet that is loose or worn. What can I do on the outside of my home? Use bright outdoor lighting. Repair the edges of walkways and driveways and fix any cracks. Clear paths of anything that can make you trip, such as tools or  rocks. Add color or contrast paint or tape to clearly mark and help you see high doorway thresholds. Trim any bushes or trees on the main path into your home. Check that handrails are securely fastened and in good repair. Both sides of all steps should have handrails. Install guardrails along the edges of any raised decks or porches. Have leaves, snow, and ice cleared regularly. Use sand, salt, or ice melt on walkways during winter months if you live where there is ice and snow. In the garage, clean up any spills right away, including grease or oil spills. What other actions can I take? Review your medicines with your health care provider. Some medicines can make you confused or feel dizzy. This can increase your chance of falling. Wear closed-toe shoes that fit well and support your feet. Wear shoes that have rubber soles and low heels.  Use a cane, walker, scooter, or crutches that help you move around if needed. Talk with your provider about other ways that you can decrease your risk of falls. This may include seeing a physical therapist to learn to do exercises to improve movement and strength. Where to find more information Centers for Disease Control and Prevention, STEADI: TonerPromos.no [employer] on Aging: BaseRingTones.pl National Institute on Aging: BaseRingTones.pl Contact a health care provider if: You are afraid of falling at home. You feel weak, drowsy, or dizzy at home. You fall at home. Get help right away if you: Lose consciousness or have trouble moving after a fall. Have a fall that causes a head injury. These symptoms may be an emergency. Get help right away. Call 911. Do not wait to see if the symptoms will go away. Do not drive yourself to the hospital. This information is not intended to replace advice given to you by your health care provider. Make sure you discuss any questions you have with your health care provider. Document Revised: 04/15/2022 Document Reviewed:  04/15/2022 Elsevier Patient Education  2024 [employer].

## 2024-06-03 NOTE — Progress Notes (Signed)
 Subjective:   David Quinn is a 68 y.o. who presents for a Medicare Wellness preventive visit.  As a reminder, Annual Wellness Visits don't include a physical exam, and some assessments may be limited, especially if this visit is performed virtually. We may recommend an in-person follow-up visit with your provider if needed.  Visit Complete: Virtual I connected with  Toribio LITTIE Brunswick on 06/03/24 by a audio enabled telemedicine application and verified that I am speaking with the correct person using two identifiers.  Patient Location: Home  Provider Location: Home Office  I discussed the limitations of evaluation and management by telemedicine. The patient expressed understanding and agreed to proceed.  Vital Signs: Because this visit was a virtual/telehealth visit, some criteria may be missing or patient reported. Any vitals not documented were not able to be obtained and vitals that have been documented are patient reported.  VideoDeclined- This patient declined Librarian, academic. Therefore the visit was completed with audio only.  Persons Participating in Visit: Patient.  AWV Questionnaire: No: Patient Medicare AWV questionnaire was not completed prior to this visit.  Cardiac Risk Factors include: advanced age (>83men, >86 women);male gender;dyslipidemia;hypertension     Objective:    Today's Vitals   06/03/24 1509  Weight: 160 lb (72.6 kg)  Height: 5' 7 (1.702 m)   Body mass index is 25.06 kg/m.     06/03/2024    3:20 PM 02/05/2023   10:13 AM 07/29/2020    6:31 PM 07/28/2020   12:24 PM  Advanced Directives  Does Patient Have a Medical Advance Directive? No No No No  Would patient like information on creating a medical advance directive? Yes (MAU/Ambulatory/Procedural Areas - Information given) No - Patient declined No - Patient declined No - Patient declined    Current Medications (verified) Outpatient Encounter Medications as of  06/03/2024  Medication Sig   albuterol  (VENTOLIN  HFA) 108 (90 Base) MCG/ACT inhaler INHALE 2 PUFFS INTO THE LUNGS EVERY 4 HOURS AS NEEDED FOR WHEEZING OR SHORTNESS OF BREATH.   aspirin  EC 81 MG tablet Take 81 mg by mouth daily. Swallow whole.   fluticasone  furoate-vilanterol (BREO ELLIPTA ) 100-25 MCG/ACT AEPB Inhale 1 puff into the lungs daily.   lisinopril  (ZESTRIL ) 10 MG tablet TAKE 1 TABLET(10 MG) BY MOUTH DAILY   rosuvastatin  (CRESTOR ) 10 MG tablet Take 1 tablet (10 mg total) by mouth daily.   No facility-administered encounter medications on file as of 06/03/2024.    Allergies (verified) Patient has no known allergies.   History: Past Medical History:  Diagnosis Date   Arthritis    Asthma    Hypertension    Past Surgical History:  Procedure Laterality Date   COLONOSCOPY WITH PROPOFOL  N/A 02/05/2023   Procedure: COLONOSCOPY WITH PROPOFOL ;  Surgeon: Jinny Carmine, MD;  Location: [facility] ENDOSCOPY;  Service: Endoscopy;  Laterality: N/A;  REQUESTS ABOUT 10 AM ARRIVAL   JOINT REPLACEMENT Bilateral    hip replacement   LUMBAR LAMINECTOMY/DECOMPRESSION MICRODISCECTOMY Left 07/29/2020   Procedure: LUMBAR LAMINECTOMY/DECOMPRESSION MICRODISCECTOMY 2 LEVELS;  Surgeon: Clois Fret, MD;  Location: [facility] ORS;  Service: Neurosurgery;  Laterality: Left;   TRACHEOSTOMY     Family History  Problem Relation Age of Onset   Asthma Mother    Cancer Mother    Hyperlipidemia Mother    Hypertension Mother    Hypertension Father    Stroke Father    Cancer Brother        stomach   Seizures Brother  Social History   Socioeconomic History   Marital status: Married    Spouse name: Maryelizabeth   Number of children: 3   Years of education: Not on file   Highest education level: Not on file  Occupational History   Occupation: retired  Tobacco Use   Smoking status: Never   Smokeless tobacco: Never  Vaping Use   Vaping status: Never Used  Substance and Sexual Activity   Alcohol use: No     Alcohol/week: 0.0 standard drinks of alcohol   Drug use: No   Sexual activity: Yes  Other Topics Concern   Not on file  Social History Narrative   Not on file   Social Drivers of Health   Financial Resource Strain: Low Risk  (06/03/2024)   Overall Financial Resource Strain (CARDIA)    Difficulty of Paying Living Expenses: Not hard at all  Food Insecurity: No Food Insecurity (06/03/2024)   Hunger Vital Sign    Worried About Running Out of Food in the Last Year: Never true    Ran Out of Food in the Last Year: Never true  Transportation Needs: No Transportation Needs (06/03/2024)   PRAPARE - Administrator, Civil Service (Medical): No    Lack of Transportation (Non-Medical): No  Physical Activity: Inactive (06/03/2024)   Exercise Vital Sign    Days of Exercise per Week: 0 days    Minutes of Exercise per Session: 0 min  Stress: No Stress Concern Present (06/03/2024)   [employer] of Occupational Health - Occupational Stress Questionnaire    Feeling of Stress: Not at all  Social Connections: Moderately Integrated (06/03/2024)   Social Connection and Isolation Panel    Frequency of Communication with Friends and Family: More than three times a week    Frequency of Social Gatherings with Friends and Family: More than three times a week    Attends Religious Services: 1 to 4 times per year    Active Member of [employer] or Organizations: No    Attends Engineer, structural: Never    Marital Status: Married    Tobacco Counseling Counseling given: Not Answered    Clinical Intake:  Pre-visit preparation completed: Yes  Pain : No/denies pain     BMI - recorded: 25.06 Nutritional Status: BMI 25 -29 Overweight Nutritional Risks: None Diabetes: No  No results found for: HGBA1C   How often do you need to have someone help you when you read instructions, pamphlets, or other written materials from your doctor or pharmacy?: 1 - Never  Interpreter Needed?:  No  Information entered by :: Vina Ned, CMA   Activities of Daily Living     06/03/2024    3:11 PM 01/14/2024    1:19 PM  In your present state of health, do you have any difficulty performing the following activities:  Hearing? 0 0  Vision? 0 0  Difficulty concentrating or making decisions? 0 0  Walking or climbing stairs? 1 0  Comment s/p back surgery   Dressing or bathing? 0 0  Doing errands, shopping? 0 0  Preparing Food and eating ? N   Using the Toilet? N   In the past six months, have you accidently leaked urine? N   Do you have problems with loss of bowel control? N   Managing your Medications? N   Managing your Finances? N   Housekeeping or managing your Housekeeping? N     Patient Care Team: Cannady, Jolene T, NP as PCP -  General (Nurse Practitioner)  I have updated your Care Teams any recent Medical Services you may have received from other providers in the past year.     Assessment:   This is a routine wellness examination for Xiong.  Hearing/Vision screen Hearing Screening - Comments:: Denies hearing loss  Vision Screening - Comments:: Recommended routine eye exam. [facility] [place]   Goals Addressed             This Visit's Progress    Patient Stated       Get left leg stronger        Depression Screen     06/03/2024    3:18 PM 01/14/2024    1:08 PM 07/16/2023    1:18 PM 01/13/2023    2:21 PM 05/13/2022    4:01 PM 02/08/2022    4:10 PM 12/29/2020   10:13 AM  PHQ 2/9 Scores  PHQ - 2 Score 0 0 0 0 0 0 0  PHQ- 9 Score 0 0 0 0 0 0     Fall Risk     06/03/2024    3:22 PM 01/14/2024    1:08 PM 07/16/2023    1:18 PM 01/13/2023    2:21 PM 05/13/2022    4:00 PM  Fall Risk   Falls in the past year? 0 0 0 0 0  Number falls in past yr: 0 0 0 0 0  Injury with Fall? 0 0 0 0 0  Risk for fall due to : No Fall Risks No Fall Risks No Fall Risks No Fall Risks No Fall Risks  Follow up Falls evaluation completed Falls evaluation completed  Falls evaluation completed Falls evaluation completed Falls evaluation completed      Data saved with a previous flowsheet row definition    MEDICARE RISK AT HOME:  Medicare Risk at Home Any stairs in or around the home?: Yes If so, are there any without handrails?: No Home free of loose throw rugs in walkways, pet beds, electrical cords, etc?: Yes Adequate lighting in your home to reduce risk of falls?: Yes Life alert?: No Use of a cane, walker or w/c?: No Grab bars in the bathroom?: Yes Shower chair or bench in shower?: Yes Elevated toilet seat or a handicapped toilet?: Yes  TIMED UP AND GO:  Was the test performed?  No  Cognitive Function: 6CIT completed        06/03/2024    3:23 PM 01/13/2023    2:51 PM  6CIT Screen  What Year? 0 points 0 points  What month? 0 points 0 points  What time? 0 points 0 points  Count back from 20 0 points 0 points  Months in reverse 0 points 0 points  Repeat phrase 0 points 0 points  Total Score 0 points 0 points    Immunizations Immunization History  Administered Date(s) Administered   Pneumococcal Polysaccharide-23 05/11/2013   Td 01/09/2006, 10/08/2017    Screening Tests Health Maintenance  Topic Date Due   Zoster Vaccines- Shingrix (1 of 2) Never done   COVID-19 Vaccine (1 - 2025-26 season) Never done   Pneumococcal Vaccine: 50+ Years (2 of 2 - PCV) 07/15/2024 (Originally 05/11/2014)   Influenza Vaccine  11/23/2024 (Originally 03/26/2024)   Medicare Annual Wellness (AWV)  06/03/2025   DTaP/Tdap/Td (3 - Tdap) 10/09/2027   Colonoscopy  02/04/2033   Hepatitis C Screening  Completed   Meningococcal B Vaccine  Aged Out    Health Maintenance Items Addressed: See Nurse  Notes at the end of this note  Additional Screening:  Vision Screening: Recommended annual ophthalmology exams for early detection of glaucoma and other disorders of the eye. Is the patient up to date with their annual eye exam?  No  Who is the provider or what  is the name of the office in which the patient attends annual eye exams? [facility]. Patient to call and schedule appointmnet  Dental Screening: Recommended annual dental exams for proper oral hygiene  Community Resource Referral / Chronic Care Management: CRR required this visit?  No   CCM required this visit?  No   Plan:    I have personally reviewed and noted the following in the patient's chart:   Medical and social history Use of alcohol, tobacco or illicit drugs  Current medications and supplements including opioid prescriptions. Patient is not currently taking opioid prescriptions. Functional ability and status Nutritional status Physical activity Advanced directives List of other physicians Hospitalizations, surgeries, and ER visits in previous 12 months Vitals Screenings to include cognitive, depression, and falls Referrals and appointments  In addition, I have reviewed and discussed with patient certain preventive protocols, quality metrics, and best practice recommendations. A written personalized care plan for preventive services as well as general preventive health recommendations were provided to patient.   Vina Ned, CMA   06/03/2024   After Visit Summary: (Mail) Due to this being a telephonic visit, the after visit summary with patients personalized plan was offered to patient via mail   Notes:  Needs routine eye exam. Gave Dr. Lavona phone number to call and schedule Declined flu, pneumonia, covid and shingles vaccines

## 2024-07-11 NOTE — Patient Instructions (Signed)
 Be Involved in Caring For Your Health:  Taking Medications When medications are taken as directed, they can greatly improve your health. But if they are not taken as prescribed, they may not work. In some cases, not taking them correctly can be harmful. To help ensure your treatment remains effective and safe, understand your medications and how to take them. Bring your medications to each visit for review by your provider.  Your lab results, notes, and after visit summary will be available on My Chart. We strongly encourage you to use this feature. If lab results are abnormal the clinic will contact you with the appropriate steps. If the clinic does not contact you assume the results are satisfactory. You can always view your results on My Chart. If you have questions regarding your health or results, please contact the clinic during office hours. You can also ask questions on My Chart.  We at [facility] are grateful that you chose us  to provide your care. We strive to provide evidence-based and compassionate care and are always looking for feedback. If you get a survey from the clinic please complete this so we can hear your opinions.  DASH Eating Plan DASH stands for Dietary Approaches to Stop Hypertension. The DASH eating plan is a healthy eating plan that has been shown to: Lower high blood pressure (hypertension). Reduce your risk for type 2 diabetes, heart disease, and stroke. Help with weight loss. What are tips for following this plan? Reading food labels Check food labels for the amount of salt (sodium) per serving. Choose foods with less than 5 percent of the Daily Value (DV) of sodium. In general, foods with less than 300 milligrams (mg) of sodium per serving fit into this eating plan. To find whole grains, look for the word whole as the first word in the ingredient list. Shopping Buy products labeled as low-sodium or no salt added. Buy fresh foods. Avoid canned  foods and pre-made or frozen meals. Cooking Try not to add salt when you cook. Use salt-free seasonings or herbs instead of table salt or sea salt. Check with your health care provider or pharmacist before using salt substitutes. Do not fry foods. Cook foods in healthy ways, such as baking, boiling, grilling, roasting, or broiling. Cook using oils that are good for your heart. These include olive, canola, avocado, soybean, and sunflower oil. Meal planning  Eat a balanced diet. This should include: 4 or more servings of fruits and 4 or more servings of vegetables each day. Try to fill half of your plate with fruits and vegetables. 6-8 servings of whole grains each day. 6 or less servings of lean meat, poultry, or fish each day. 1 oz is 1 serving. A 3 oz (85 g) serving of meat is about the same size as the palm of your hand. One egg is 1 oz (28 g). 2-3 servings of low-fat dairy each day. One serving is 1 cup (237 mL). 1 serving of nuts, seeds, or beans 5 times each week. 2-3 servings of heart-healthy fats. Healthy fats called omega-3 fatty acids are found in foods such as walnuts, flaxseeds, fortified milks, and eggs. These fats are also found in cold-water fish, such as sardines, salmon, and mackerel. Limit how much you eat of: Canned or prepackaged foods. Food that is high in trans fat, such as fried foods. Food that is high in saturated fat, such as fatty meat. Desserts and other sweets, sugary drinks, and other foods with added sugar. Full-fat  dairy products. Do not salt foods before eating. Do not eat more than 4 egg yolks a week. Try to eat at least 2 vegetarian meals a week. Eat more home-cooked food and less restaurant, buffet, and fast food. Lifestyle When eating at a restaurant, ask if your food can be made with less salt or no salt. If you drink alcohol: Limit how much you have to: 0-1 drink a day if you are male. 0-2 drinks a day if you are male. Know how much alcohol is in  your drink. In the [place] one drink is one 12 oz bottle of beer (355 mL), one 5 oz glass of wine (148 mL), or one 1 oz glass of hard liquor (44 mL). General information Avoid eating more than 2,300 mg of salt a day. If you have hypertension, you may need to reduce your sodium intake to 1,500 mg a day. Work with your provider to stay at a healthy body weight or lose weight. Ask what the best weight range is for you. On most days of the week, get at least 30 minutes of exercise that causes your heart to beat faster. This may include walking, swimming, or biking. Work with your provider or dietitian to adjust your eating plan to meet your specific calorie needs. What foods should I eat? Fruits All fresh, dried, or frozen fruit. Canned fruits that are in their natural juice and do not have sugar added to them. Vegetables Fresh or frozen vegetables that are raw, steamed, roasted, or grilled. Low-sodium or reduced-sodium tomato and vegetable juice. Low-sodium or reduced-sodium tomato sauce and tomato paste. Low-sodium or reduced-sodium canned vegetables. Grains Whole-grain or whole-wheat bread. Whole-grain or whole-wheat pasta. Brown rice. Mcneil Madeira. Bulgur. Whole-grain and low-sodium cereals. Pita bread. Low-fat, low-sodium crackers. Whole-wheat flour tortillas. Meats and other proteins Skinless chicken or mal[place]. Ground chicken or [place]. Pork with fat trimmed off. Fish and seafood. Egg whites. Dried beans, peas, or lentils. Unsalted nuts, nut butters, and seeds. Unsalted canned beans. Lean cuts of beef with fat trimmed off. Low-sodium, lean precooked or cured meat, such as sausages or meat loaves. Dairy Low-fat (1%) or fat-free (skim) milk. Reduced-fat, low-fat, or fat-free cheeses. Nonfat, low-sodium ricotta or cottage cheese. Low-fat or nonfat yogurt. Low-fat, low-sodium cheese. Fats and oils Soft margarine without trans fats. Vegetable oil. Reduced-fat, low-fat, or light mayonnaise and salad  dressings (reduced-sodium). Canola, safflower, olive, avocado, soybean, and sunflower oils. Avocado. Seasonings and condiments Herbs. Spices. Seasoning mixes without salt. Other foods Unsalted popcorn and pretzels. Fat-free sweets. The items listed above may not be all the foods and drinks you can have. Talk to a dietitian to learn more. What foods should I avoid? Fruits Canned fruit in a light or heavy syrup. Fried fruit. Fruit in cream or butter sauce. Vegetables Creamed or fried vegetables. Vegetables in a cheese sauce. Regular canned vegetables that are not marked as low-sodium or reduced-sodium. Regular canned tomato sauce and paste that are not marked as low-sodium or reduced-sodium. Regular tomato and vegetable juices that are not marked as low-sodium or reduced-sodium. Dene. Olives. Grains Baked goods made with fat, such as croissants, muffins, or some breads. Dry pasta or rice meal packs. Meats and other proteins Fatty cuts of meat. Ribs. Fried meat. [place]ona. Bologna, salami, and other precooked or cured meats, such as sausages or meat loaves, that are not lean and low in sodium. Fat from the back of a pig (fatback). Bratwurst. Salted nuts and seeds. Canned beans with added salt. Canned  or smoked fish. Whole eggs or egg yolks. Chicken or [place] with skin. Dairy Whole or 2% milk, cream, and half-and-half. Whole or full-fat cream cheese. Whole-fat or sweetened yogurt. Full-fat cheese. Nondairy creamers. Whipped toppings. Processed cheese and cheese spreads. Fats and oils Butter. Stick margarine. Lard. Shortening. Ghee. Bacon fat. Tropical oils, such as coconut, palm kernel, or palm oil. Seasonings and condiments Onion salt, garlic salt, seasoned salt, table salt, and sea salt. Worcestershire sauce. Tartar sauce. Barbecue sauce. Teriyaki sauce. Soy sauce, including reduced-sodium soy sauce. Steak sauce. Canned and packaged gravies. Fish sauce. Oyster sauce. Cocktail sauce. Store-bought  horseradish. Ketchup. Mustard. Meat flavorings and tenderizers. Bouillon cubes. Hot sauces. Pre-made or packaged marinades. Pre-made or packaged taco seasonings. Relishes. Regular salad dressings. Other foods Salted popcorn and pretzels. The items listed above may not be all the foods and drinks you should avoid. Talk to a dietitian to learn more. Where to find more information National Heart, Lung, and Blood Institute (NHLBI): BuffaloDryCleaner.gl American Heart Association (AHA): heart.org Academy of Nutrition and Dietetics: eatright.org National Kidney Foundation (NKF): kidney.org This information is not intended to replace advice given to you by your health care provider. Make sure you discuss any questions you have with your health care provider. Document Revised: 08/29/2022 Document Reviewed: 08/29/2022 Elsevier Patient Education  2024 [employer].

## 2024-07-16 ENCOUNTER — Ambulatory Visit: Admitting: Nurse Practitioner

## 2024-07-16 ENCOUNTER — Encounter: Payer: Self-pay | Admitting: Nurse Practitioner

## 2024-07-16 VITALS — BP 146/88 | HR 81 | Temp 98.4°F | Resp 15 | Ht 67.01 in | Wt 156.4 lb

## 2024-07-16 DIAGNOSIS — I10 Essential (primary) hypertension: Secondary | ICD-10-CM | POA: Diagnosis not present

## 2024-07-16 DIAGNOSIS — J4540 Moderate persistent asthma, uncomplicated: Secondary | ICD-10-CM | POA: Diagnosis not present

## 2024-07-16 DIAGNOSIS — E7800 Pure hypercholesterolemia, unspecified: Secondary | ICD-10-CM

## 2024-07-16 DIAGNOSIS — Z23 Encounter for immunization: Secondary | ICD-10-CM

## 2024-07-16 DIAGNOSIS — D709 Neutropenia, unspecified: Secondary | ICD-10-CM | POA: Diagnosis not present

## 2024-07-16 NOTE — Assessment & Plan Note (Signed)
 Chronic, ongoing. Continue Albuterol  as needed and Breo maintenance.  Adjust regimen as needed based on symptoms.  Spirometry next visit.  Return in 6 months.

## 2024-07-16 NOTE — Assessment & Plan Note (Signed)
 Chronic, ongoing.  Continue current medication regimen and adjust as needed. Lipid panel today.

## 2024-07-16 NOTE — Progress Notes (Signed)
 BP (!) 146/88 (BP Location: Left Arm, Patient Position: Sitting, Cuff Size: Normal)   Pulse 81   Temp 98.4 F (36.9 C) (Oral)   Resp 15   Ht 5' 7.01 (1.702 m)   Wt 156 lb 6.4 oz (70.9 kg)   SpO2 98%   BMI 24.49 kg/m    Subjective:    Patient ID: Toribio LITTIE Brunswick, male    DOB: 1956-06-28, 68 y.o.   MRN: [id number]  HPI: ROSS BENDER is a 68 y.o. male  Chief Complaint  Patient presents with   HTN/HLD    No home checks. Taking medication as prescribed.    HYPERTENSION / HYPERLIPIDEMIA Takes Lisinopril  and Rosuvastatin  daily. Missed taking BP meds this morning. Satisfied with current treatment? yes Duration of hypertension: chronic BP monitoring frequency: not checking BP range: not checking BP medication side effects: no Duration of hyperlipidemia: chronic Aspirin : no Recent stressors: no Recurrent headaches: no Visual changes: no Palpitations: no Dyspnea: no Chest pain: no Lower extremity edema: no Dizzy/lightheaded: no  The 10-year ASCVD risk score (Arnett DK, et al., 2019) is: 19.2%   Values used to calculate the score:     Age: 51 years     Clincally relevant sex: Male     Is Non-Hispanic African American: Yes     Diabetic: No     Tobacco smoker: No     Systolic Blood Pressure: 146 mmHg     Is BP treated: Yes     HDL Cholesterol: 61 mg/dL     Total Cholesterol: 141 mg/dL  ASTHMA Uses Breo and Albuterol .  Is switching insurance and will need different long acting in new year. History of low WBC ==  last May 2025 was 3.6.  No B symptoms. Initially presented, in 2020 with WBC 2.5.   Asthma status: stable Satisfied with current treatment?: yes Albuterol /rescue inhaler frequency: rarely Dyspnea frequency: none Wheezing frequency: none Cough frequency:  none Nocturnal symptom frequency: no Limitation of activity: no Current upper respiratory symptoms: no Triggers: weather changes Last Spirometry: unknown Failed/intolerant to following asthma  meds: unknown Aerochamber/spacer use: no Visits to ER or Urgent Care in past year: no Pneumovax: refuses Influenza: Up To Date     07/16/2024    1:50 PM 06/03/2024    3:18 PM 01/14/2024    1:08 PM 07/16/2023    1:18 PM 01/13/2023    2:21 PM  Depression screen PHQ 2/9  Decreased Interest 0 0 0 0 0  Down, Depressed, Hopeless 0 0 0 0 0  PHQ - 2 Score 0 0 0 0 0  Altered sleeping 0 0 0 0 0  Tired, decreased energy 0 0 0 0 0  Change in appetite 0 0 0 0 0  Feeling bad or failure about yourself  0 0 0 0 0  Trouble concentrating 0 0 0 0 0  Moving slowly or fidgety/restless 0 0 0 0 0  Suicidal thoughts 0 0 0 0 0  PHQ-9 Score 0 0  0  0  0   Difficult doing work/chores  Not difficult at all Not difficult at all Not difficult at all Not difficult at all     Data saved with a previous flowsheet row definition       07/16/2024    1:50 PM 01/14/2024    1:08 PM 07/16/2023    1:19 PM 01/13/2023    2:21 PM  GAD 7 : Generalized Anxiety Score  Nervous, Anxious, on Edge 0 0 0 0  Control/stop worrying 0 0 0 0  Worry too much - different things 0 0 0 0  Trouble relaxing 0 0 0 0  Restless 0 0 0 0  Easily annoyed or irritable 0 0 0 0  Afraid - awful might happen 0 0 0 0  Total GAD 7 Score 0 0 0 0  Anxiety Difficulty  Not difficult at all Not difficult at all Not difficult at all   Relevant past medical, surgical, family and social history reviewed and updated as indicated. Interim medical history since our last visit reviewed. Allergies and medications reviewed and updated.  Review of Systems  Constitutional:  Negative for activity change, diaphoresis, fatigue and fever.  Respiratory:  Negative for cough, chest tightness, shortness of breath and wheezing.   Cardiovascular:  Negative for chest pain, palpitations and leg swelling.  Gastrointestinal: Negative.   Neurological: Negative.   Psychiatric/Behavioral: Negative.      Per HPI unless specifically indicated above     Objective:     BP (!) 146/88 (BP Location: Left Arm, Patient Position: Sitting, Cuff Size: Normal)   Pulse 81   Temp 98.4 F (36.9 C) (Oral)   Resp 15   Ht 5' 7.01 (1.702 m)   Wt 156 lb 6.4 oz (70.9 kg)   SpO2 98%   BMI 24.49 kg/m   Wt Readings from Last 3 Encounters:  07/16/24 156 lb 6.4 oz (70.9 kg)  06/03/24 160 lb (72.6 kg)  01/14/24 160 lb 9.6 oz (72.8 kg)    Physical Exam Vitals and nursing note reviewed.  Constitutional:      General: He is awake. He is not in acute distress.    Appearance: He is well-developed and well-groomed. He is not ill-appearing or toxic-appearing.  HENT:     Head: Normocephalic and atraumatic.     Right Ear: Hearing normal. No drainage.     Left Ear: Hearing normal. No drainage.  Eyes:     General: Lids are normal.        Right eye: No discharge.        Left eye: No discharge.     Conjunctiva/sclera: Conjunctivae normal.     Pupils: Pupils are equal, round, and reactive to light.  Neck:     Thyroid: No thyromegaly.     Vascular: No carotid bruit.  Cardiovascular:     Rate and Rhythm: Normal rate and regular rhythm.     Heart sounds: Normal heart sounds, S1 normal and S2 normal. No murmur heard.    No gallop.  Pulmonary:     Effort: Pulmonary effort is normal. No accessory muscle usage or respiratory distress.     Breath sounds: Normal breath sounds.  Abdominal:     General: Bowel sounds are normal.     Palpations: Abdomen is soft.  Musculoskeletal:        General: Normal range of motion.     Cervical back: Normal range of motion and neck supple.     Right lower leg: No edema.     Left lower leg: No edema.  Lymphadenopathy:     Head:     Right side of head: No submental, submandibular, tonsillar, preauricular or posterior auricular adenopathy.     Left side of head: No submental, submandibular, tonsillar, preauricular or posterior auricular adenopathy.     Cervical: No cervical adenopathy.  Skin:    General: Skin is warm and dry.   Neurological:     Mental Status: He is alert and oriented  to person, place, and time.  Psychiatric:        Mood and Affect: Mood normal.        Behavior: Behavior normal. Behavior is cooperative.        Thought Content: Thought content normal.        Judgment: Judgment normal.    Results for orders placed or performed in visit on 01/14/24  Microalbumin, Urine Waived   Collection Time: 01/14/24  1:16 PM  Result Value Ref Range   Microalb, Ur Waived 10 0 - 19 mg/L   Creatinine, Urine Waived 10 10 - 300 mg/dL   Microalb/Creat Ratio 30-300 (H) <30 mg/g  CBC with Differential/Platelet   Collection Time: 01/14/24  1:30 PM  Result Value Ref Range   WBC 3.6 3.4 - 10.8 x10E3/uL   RBC 5.05 4.14 - 5.80 x10E6/uL   Hemoglobin 13.1 13.0 - 17.7 g/dL   Hematocrit 58.2 62.4 - 51.0 %   MCV 83 79 - 97 fL   MCH 25.9 (L) 26.6 - 33.0 pg   MCHC 31.4 (L) 31.5 - 35.7 g/dL   RDW 87.1 88.3 - 84.5 %   Platelets 267 150 - 450 x10E3/uL   Neutrophils 46 Not Estab. %   Lymphs 30 Not Estab. %   Monocytes 11 Not Estab. %   Eos 12 Not Estab. %   Basos 1 Not Estab. %   Neutrophils Absolute 1.7 1.4 - 7.0 x10E3/uL   Lymphocytes Absolute 1.1 0.7 - 3.1 x10E3/uL   Monocytes Absolute 0.4 0.1 - 0.9 x10E3/uL   EOS (ABSOLUTE) 0.4 0.0 - 0.4 x10E3/uL   Basophils Absolute 0.0 0.0 - 0.2 x10E3/uL   Immature Granulocytes 0 Not Estab. %   Immature Grans (Abs) 0.0 0.0 - 0.1 x10E3/uL  Comprehensive metabolic panel with GFR   Collection Time: 01/14/24  1:30 PM  Result Value Ref Range   Glucose 73 70 - 99 mg/dL   BUN 22 8 - 27 mg/dL   Creatinine, Ser 8.90 0.76 - 1.27 mg/dL   eGFR 74 >40 fO/fpw/8.26   BUN/Creatinine Ratio 20 10 - 24   Sodium 133 (L) 134 - 144 mmol/L   Potassium 4.8 3.5 - 5.2 mmol/L   Chloride 97 96 - 106 mmol/L   CO2 21 20 - 29 mmol/L   Calcium  9.0 8.6 - 10.2 mg/dL   Total Protein 7.0 6.0 - 8.5 g/dL   Albumin 4.2 3.9 - 4.9 g/dL   Globulin, Total 2.8 1.5 - 4.5 g/dL   Bilirubin Total 0.5 0.0 - 1.2  mg/dL   Alkaline Phosphatase 77 44 - 121 IU/L   AST 38 0 - 40 IU/L   ALT 38 0 - 44 IU/L  Lipid Panel w/o Chol/HDL Ratio   Collection Time: 01/14/24  1:30 PM  Result Value Ref Range   Cholesterol, Total 141 100 - 199 mg/dL   Triglycerides 61 0 - 149 mg/dL   HDL 61 >60 mg/dL   VLDL Cholesterol Cal 13 5 - 40 mg/dL   LDL Chol Calc (NIH) 67 0 - 99 mg/dL  TSH   Collection Time: 01/14/24  1:30 PM  Result Value Ref Range   TSH 1.600 0.450 - 4.500 uIU/mL  PSA   Collection Time: 01/14/24  1:30 PM  Result Value Ref Range   Prostate Specific Ag, Serum 2.0 0.0 - 4.0 ng/mL  C-reactive protein   Collection Time: 01/14/24  1:30 PM  Result Value Ref Range   CRP 3 0 - 10 mg/L  Sed Rate (  ESR)   Collection Time: 01/14/24  1:30 PM  Result Value Ref Range   Sed Rate 31 (H) 0 - 30 mm/hr      Assessment & Plan:   Problem List Items Addressed This Visit       Cardiovascular and Mediastinum   Hypertension - Primary   Chronic, stable.  BP above goal in office today, but forgot to take medication today. Often is well at goal.  Continue Lisinopril  daily.  Recommend he monitor BP at least a few mornings a week at home and document.  DASH diet at home.  Continue current medication regimen and adjust as needed.  Labs: CMP, CBC.        Relevant Orders   Comprehensive metabolic panel with GFR     Respiratory   Asthma, moderate persistent   Chronic, ongoing. Continue Albuterol  as needed and Breo maintenance.  Adjust regimen as needed based on symptoms.  Spirometry next visit.  Return in 6 months.          Other   Neutropenia   Noted on labs, initially in 2020 with WBC 2.5, recent was 3.6 (normal).  Check CBC today.  No B symptoms.  Consider hematology referral if ongoing -- discussed at length with him today.      Relevant Orders   CBC with Differential/Platelet   Hyperlipidemia   Chronic, ongoing.  Continue current medication regimen and adjust as needed. Lipid panel today.        Relevant  Orders   Comprehensive metabolic panel with GFR   Lipid Panel w/o Chol/HDL Ratio   Other Visit Diagnoses       Flu vaccine need       Flu vaccine in office today.   Relevant Orders   Flu vaccine HIGH DOSE PF(Fluzone Trivalent)        Follow up plan: Return in about 6 months (around 01/13/2025) for Annual Physical after 01/13/25.

## 2024-07-16 NOTE — Assessment & Plan Note (Signed)
 Chronic, stable.  BP above goal in office today, but forgot to take medication today. Often is well at goal.  Continue Lisinopril  daily.  Recommend he monitor BP at least a few mornings a week at home and document.  DASH diet at home.  Continue current medication regimen and adjust as needed.  Labs: CMP, CBC.

## 2024-07-16 NOTE — Assessment & Plan Note (Signed)
 Noted on labs, initially in 2020 with WBC 2.5, recent was 3.6 (normal).  Check CBC today.  No B symptoms.  Consider hematology referral if ongoing -- discussed at length with him today.

## 2024-07-17 ENCOUNTER — Ambulatory Visit: Payer: Self-pay | Admitting: Nurse Practitioner

## 2024-07-17 DIAGNOSIS — D709 Neutropenia, unspecified: Secondary | ICD-10-CM

## 2024-07-17 LAB — COMPREHENSIVE METABOLIC PANEL WITH GFR
ALT: 20 IU/L (ref 0–44)
AST: 24 IU/L (ref 0–40)
Albumin: 4 g/dL (ref 3.9–4.9)
Alkaline Phosphatase: 84 IU/L (ref 47–123)
BUN/Creatinine Ratio: 14 (ref 10–24)
BUN: 12 mg/dL (ref 8–27)
Bilirubin Total: 0.6 mg/dL (ref 0.0–1.2)
CO2: 22 mmol/L (ref 20–29)
Calcium: 9.3 mg/dL (ref 8.6–10.2)
Chloride: 100 mmol/L (ref 96–106)
Creatinine, Ser: 0.85 mg/dL (ref 0.76–1.27)
Globulin, Total: 2.8 g/dL (ref 1.5–4.5)
Glucose: 70 mg/dL (ref 70–99)
Potassium: 4.2 mmol/L (ref 3.5–5.2)
Sodium: 136 mmol/L (ref 134–144)
Total Protein: 6.8 g/dL (ref 6.0–8.5)
eGFR: 95 mL/min/1.73 (ref >59)

## 2024-07-17 LAB — CBC WITH DIFFERENTIAL/PLATELET
Basophils Absolute: 0 x10E3/uL (ref 0.0–0.2)
Basos: 1 %
EOS (ABSOLUTE): 0.5 x10E3/uL — ABNORMAL HIGH (ref 0.0–0.4)
Eos: 16 %
Hematocrit: 44.5 % (ref 37.5–51.0)
Hemoglobin: 13.7 g/dL (ref 13.0–17.7)
Immature Grans (Abs): 0 x10E3/uL (ref 0.0–0.1)
Immature Granulocytes: 0 %
Lymphocytes Absolute: 1.1 x10E3/uL (ref 0.7–3.1)
Lymphs: 34 %
MCH: 25.8 pg — ABNORMAL LOW (ref 26.6–33.0)
MCHC: 30.8 g/dL — ABNORMAL LOW (ref 31.5–35.7)
MCV: 84 fL (ref 79–97)
Monocytes Absolute: 0.4 x10E3/uL (ref 0.1–0.9)
Monocytes: 12 %
Neutrophils Absolute: 1.2 x10E3/uL — ABNORMAL LOW (ref 1.4–7.0)
Neutrophils: 37 %
Platelets: 286 x10E3/uL (ref 150–450)
RBC: 5.31 x10E6/uL (ref 4.14–5.80)
RDW: 12.8 % (ref 11.6–15.4)
WBC: 3.2 x10E3/uL — ABNORMAL LOW (ref 3.4–10.8)

## 2024-07-17 LAB — LIPID PANEL W/O CHOL/HDL RATIO
Cholesterol, Total: 219 mg/dL — ABNORMAL HIGH (ref 100–199)
HDL: 70 mg/dL (ref >39)
LDL Chol Calc (NIH): 140 mg/dL — ABNORMAL HIGH (ref 0–99)
Triglycerides: 50 mg/dL (ref 0–149)
VLDL Cholesterol Cal: 9 mg/dL (ref 5–40)

## 2024-07-17 NOTE — Progress Notes (Signed)
 Good morning, please let Vickey know labs have returned and he needs a 4 week lab visit only as well scheduled: - CBC is showing mildly low white blood cell and neutrophils which fluctuates on your labs. I would like to recheck this outpatient in 4 weeks please. - Lipid panel is showing elevations, are you taking your Rosuvastatin  daily? Please ensure you are because past labs showed stable levels compared to these labs. - Kidney and liver function are normal. Any questions? Keep being stellar!!  Thank you for allowing me to participate in your care.  I appreciate you. Kindest regards, Ciel Chervenak

## 2024-07-29 ENCOUNTER — Other Ambulatory Visit: Payer: Self-pay | Admitting: Nurse Practitioner

## 2024-07-31 NOTE — Telephone Encounter (Signed)
 Requested Prescriptions  Pending Prescriptions Disp Refills   albuterol  (VENTOLIN  HFA) 108 (90 Base) MCG/ACT inhaler [Pharmacy Med Name: ALBUTEROL  HFA (PROAIR ) INHALER] 8.5 each 2    Sig: INHALE 2 PUFFS INTO THE LUNGS EVERY 4 HOURS AS NEEDED FOR WHEEZING OR SHORTNESS OF BREATH.     Pulmonology:  Beta Agonists 2 Failed - 07/31/2024  9:46 AM      Failed - Last BP in normal range    BP Readings from Last 1 Encounters:  07/16/24 (!) 146/88         Passed - Last Heart Rate in normal range    Pulse Readings from Last 1 Encounters:  07/16/24 81         Passed - Valid encounter within last 12 months    Recent Outpatient Visits           2 weeks ago Primary hypertension   [place] [facility] [place], Jacinto T, NP   6 months ago Neutropenia, unspecified type   [place] [facility] [place], Melanie DASEN, NP

## 2024-08-16 ENCOUNTER — Other Ambulatory Visit

## 2024-08-16 DIAGNOSIS — D709 Neutropenia, unspecified: Secondary | ICD-10-CM

## 2024-08-17 ENCOUNTER — Ambulatory Visit: Payer: Self-pay | Admitting: Nurse Practitioner

## 2024-08-17 LAB — CBC WITH DIFFERENTIAL/PLATELET
Basophils Absolute: 0 x10E3/uL (ref 0.0–0.2)
Basos: 1 %
EOS (ABSOLUTE): 0.5 x10E3/uL — ABNORMAL HIGH (ref 0.0–0.4)
Eos: 12 %
Hematocrit: 47.1 % (ref 37.5–51.0)
Hemoglobin: 14.5 g/dL (ref 13.0–17.7)
Immature Grans (Abs): 0 x10E3/uL (ref 0.0–0.1)
Immature Granulocytes: 0 %
Lymphocytes Absolute: 1.5 x10E3/uL (ref 0.7–3.1)
Lymphs: 39 %
MCH: 25.6 pg — ABNORMAL LOW (ref 26.6–33.0)
MCHC: 30.8 g/dL — ABNORMAL LOW (ref 31.5–35.7)
MCV: 83 fL (ref 79–97)
Monocytes Absolute: 0.5 x10E3/uL (ref 0.1–0.9)
Monocytes: 12 %
Neutrophils Absolute: 1.4 x10E3/uL (ref 1.4–7.0)
Neutrophils: 36 %
Platelets: 262 x10E3/uL (ref 150–450)
RBC: 5.67 x10E6/uL (ref 4.14–5.80)
RDW: 12.8 % (ref 11.6–15.4)
WBC: 3.9 x10E3/uL (ref 3.4–10.8)

## 2024-08-17 NOTE — Progress Notes (Signed)
 Please let David Quinn know white blood cell count has returned to normal and we will continue to monitor at visits.

## 2025-01-14 ENCOUNTER — Ambulatory Visit: Admitting: Nurse Practitioner

## 2025-06-09 ENCOUNTER — Ambulatory Visit
